# Patient Record
Sex: Male | Born: 1940 | Race: White | Hispanic: No | Smoking: Never smoker
Health system: Southern US, Community
[De-identification: ages and names within clinical notes are randomized; demographics above are authoritative.]

## PROBLEM LIST (undated history)

## (undated) DIAGNOSIS — I509 Heart failure, unspecified: Secondary | ICD-10-CM

## (undated) DIAGNOSIS — M1990 Unspecified osteoarthritis, unspecified site: Secondary | ICD-10-CM

## (undated) DIAGNOSIS — E785 Hyperlipidemia, unspecified: Secondary | ICD-10-CM

## (undated) DIAGNOSIS — E669 Obesity, unspecified: Secondary | ICD-10-CM

## (undated) DIAGNOSIS — K219 Gastro-esophageal reflux disease without esophagitis: Secondary | ICD-10-CM

## (undated) DIAGNOSIS — H3530 Unspecified macular degeneration: Secondary | ICD-10-CM

## (undated) DIAGNOSIS — I4891 Unspecified atrial fibrillation: Secondary | ICD-10-CM

## (undated) DIAGNOSIS — T8859XA Other complications of anesthesia, initial encounter: Secondary | ICD-10-CM

## (undated) DIAGNOSIS — M75102 Unspecified rotator cuff tear or rupture of left shoulder, not specified as traumatic: Secondary | ICD-10-CM

## (undated) DIAGNOSIS — I2510 Atherosclerotic heart disease of native coronary artery without angina pectoris: Secondary | ICD-10-CM

## (undated) DIAGNOSIS — I10 Essential (primary) hypertension: Secondary | ICD-10-CM

## (undated) DIAGNOSIS — E119 Type 2 diabetes mellitus without complications: Secondary | ICD-10-CM

## (undated) HISTORY — DX: Unspecified rotator cuff tear or rupture of left shoulder, not specified as traumatic: M75.102

## (undated) HISTORY — PX: KNEE SURGERY: SHX244

## (undated) HISTORY — PX: TONSILLECTOMY: SUR1361

## (undated) HISTORY — DX: Type 2 diabetes mellitus without complications: E11.9

## (undated) HISTORY — DX: Atherosclerotic heart disease of native coronary artery without angina pectoris: I25.10

## (undated) HISTORY — DX: Essential (primary) hypertension: I10

## (undated) HISTORY — DX: Hyperlipidemia, unspecified: E78.5

## (undated) HISTORY — DX: Obesity, unspecified: E66.9

## (undated) HISTORY — DX: Unspecified macular degeneration: H35.30

## (undated) HISTORY — DX: Unspecified atrial fibrillation: I48.91

## (undated) HISTORY — PX: CYST EXCISION: SHX5701

---

## 2004-08-08 ENCOUNTER — Emergency Department (HOSPITAL_COMMUNITY): Admission: EM | Admit: 2004-08-08 | Discharge: 2004-08-08 | Payer: Self-pay | Admitting: Emergency Medicine

## 2004-08-09 ENCOUNTER — Ambulatory Visit (HOSPITAL_COMMUNITY): Admission: RE | Admit: 2004-08-09 | Discharge: 2004-08-09 | Payer: Self-pay | Admitting: Family Medicine

## 2007-03-09 ENCOUNTER — Ambulatory Visit (HOSPITAL_COMMUNITY): Admission: RE | Admit: 2007-03-09 | Discharge: 2007-03-09 | Payer: Self-pay | Admitting: Interventional Cardiology

## 2009-03-03 ENCOUNTER — Inpatient Hospital Stay (HOSPITAL_COMMUNITY): Admission: RE | Admit: 2009-03-03 | Discharge: 2009-03-04 | Payer: Self-pay | Admitting: Interventional Cardiology

## 2009-03-03 ENCOUNTER — Encounter (INDEPENDENT_AMBULATORY_CARE_PROVIDER_SITE_OTHER): Payer: Self-pay | Admitting: Interventional Cardiology

## 2009-03-13 ENCOUNTER — Encounter (HOSPITAL_COMMUNITY): Admission: RE | Admit: 2009-03-13 | Discharge: 2009-06-04 | Payer: Self-pay | Admitting: Interventional Cardiology

## 2009-06-05 ENCOUNTER — Encounter (HOSPITAL_COMMUNITY): Admission: RE | Admit: 2009-06-05 | Discharge: 2009-09-03 | Payer: Self-pay | Admitting: Interventional Cardiology

## 2011-03-14 LAB — GLUCOSE, CAPILLARY: Glucose-Capillary: 107 mg/dL — ABNORMAL HIGH (ref 70–99)

## 2011-03-17 LAB — GLUCOSE, CAPILLARY
Glucose-Capillary: 110 mg/dL — ABNORMAL HIGH (ref 70–99)
Glucose-Capillary: 121 mg/dL — ABNORMAL HIGH (ref 70–99)
Glucose-Capillary: 130 mg/dL — ABNORMAL HIGH (ref 70–99)
Glucose-Capillary: 97 mg/dL (ref 70–99)

## 2011-03-18 LAB — CBC
HCT: 46.8 % (ref 39.0–52.0)
Hemoglobin: 16.3 g/dL (ref 13.0–17.0)
MCHC: 34.7 g/dL (ref 30.0–36.0)
RDW: 13.9 % (ref 11.5–15.5)

## 2011-03-18 LAB — BASIC METABOLIC PANEL
CO2: 27 mEq/L (ref 19–32)
Glucose, Bld: 111 mg/dL — ABNORMAL HIGH (ref 70–99)
Potassium: 3.4 mEq/L — ABNORMAL LOW (ref 3.5–5.1)
Sodium: 138 mEq/L (ref 135–145)

## 2011-04-20 NOTE — Cardiovascular Report (Signed)
NAME:  Bryan Pratt, Bryan Pratt NO.:  [id number]   MEDICAL RECORD NO.:  [id number]          PATIENT TYPE:  INP   LOCATION:  2503                         FACILITY:  [facility]   PHYSICIAN:  Corky Crafts, MDDATE OF BIRTH:  1941/04/08   DATE OF PROCEDURE:  03/03/2009  DATE OF DISCHARGE:  03/04/2009                            CARDIAC CATHETERIZATION   REFERRING PHYSICIAN:  Holley Bouche, M.D.   PROCEDURES PERFORMED:  Coronary angiogram, abdominal aortogram, left  ventriculogram, left heart catheterization, attempted PCI of the LAD.   OPERATOR:  Corky Crafts, MD   INDICATIONS:  Shortness of breath, coronary artery disease.   PROCEDURE NARRATIVE:  The risks and benefits of cardiac catheterization  were explained to the patient and informed consent was obtained.  He was  brought to the cath lab.  He was prepped and draped in the usual sterile  fashion.  His right groin was infiltrated with 1% lidocaine.  A 6-French  sheath was placed into the right femoral artery using the modified  Seldinger technique.  Right coronary artery angiography was performed  using a JR-4.0 catheter.  The catheter was advanced to the vessel ostium  under fluoroscopic guidance.  Digital angiography was performed in  multiple projections using hand injection of contrast.  In the left  system, there were separate ostia of the LAD and left circumflex.  A JL-  4 and JL-5 catheters were used to selectively engage the LAD and left  circumflex in a similar fashion described as above.  A pigtail catheter  was advanced to the ascending aorta and across the aortic valve under  fluoroscopic guidance.  A power injection of contrast was performed in  the RAO projection.  Catheter was pulled back under continuous  hemodynamic pressure monitoring and withdrawn to the abdominal  aortogram.  A power injection of contrast was performed in the AP  projection to image the abdominal aortogram.  The pigtail  catheter  images were performed after the attempted PCI of the LAD.  Please see  below for details of that procedure.   FINDINGS:  The right coronary artery was a large dominant vessel.  There  are mild irregularities in the right coronary artery.  There is a 50%  proximal posterolateral artery stenosis.   The left anterior descending had moderate proximal diffuse disease.  The  mid LAD was occluded and filled by bridging collaterals.  There are also  some right to left collaterals filling the distal LAD.   The left circumflex is a medium-sized vessel.  There were several  branches across the lateral wall.  There is a 50% stenosis in a trunk  proximal to these 3 branches, the OM-1, OM-2, and OM-3, all of which  were widely patent.   PCI NARRATIVE:  An XBLAD 3.5 catheter was used to engage the LAD as  there was no left main.  A Prowater wire was placed with an over-the-  wire balloon to the mid LAD, but was unable to cross because of bridging  collaterals.  The wire was changed out to a Miracle 3 g wire with the  same over-the-wire balloon.  There was still trouble crossing the  occlusion because of the bridging collaterals.  There is no dye staining  after multiple injections and the procedure was stopped at that point.  Heparin was used for anticoagulation.   Left ventriculogram showed anterior hypokinesis with an estimated  ejection fraction of 40-45%.   IMPRESSION:  1. 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  2. Mild-to-moderate decreased left ventricular function.  3. Mild aortic valve gradient with an left ventricular pressure of      113/7 with an left ventricular end-diastolic pressure of 18 mmHg.      Aortic pressure of 97/59 with a mean aortic pressure of 77 mmHg.  4. No abdominal aortic aneurysm or renal artery stenosis.   RECOMMENDATIONS:  1. I  had a long discussion with the patient regarding the various      options.  We talked about having him see a surgeon for bypass      surgery.  At this time, he feels like his symptoms are not that      bad.  He would like to hold off on that and continue medical      therapy at this time.  If his symptoms worsen, he will let us know      and we will plan on having him visit with a surgeon.  2. We will watch the patient overnight, and assuming no complications,      plan for discharge in the morning.      Corky Crafts, MD  Electronically Signed     JSV/MEDQ  D:  05/22/2009  T:  05/23/2009  Job:  [id number]

## 2011-04-20 NOTE — Discharge Summary (Signed)
NAME:  Bryan Pratt, RUSHER NO.:  [id number]   MEDICAL RECORD NO.:  [id number]          PATIENT TYPE:  INP   LOCATION:  2503                         FACILITY:  [facility]   PHYSICIAN:  Corky Crafts, MDDATE OF BIRTH:  1941-11-22   DATE OF ADMISSION:  03/03/2009  DATE OF DISCHARGE:  03/04/2009                               DISCHARGE SUMMARY   DISCHARGE DIAGNOSES:  1. Coronary artery disease, status post chronically occluded left      anterior descending artery with collateral, 50% circumflex, 56%      proximal posterolateral artery.  2. Ischemic cardiomyopathy with EF 40-45%.  3. Hypokalemia, repleted.  4. Dyslipidemia, treated.  5. Intolerance to IMDUR, red rash.  6. Allergy to MORPHINE, SULFATE, and CODEINE.  7. Diabetes mellitus.  8. Obesity.  9. Long-term medication use.  10.Family history of coronary artery disease.   HOSPITAL COURSE:  Mr. Griesinger is a 70 year old male patient who has a  known LAD occlusion.  He was recently working at the yard and had some  soreness in his chest and back.  He feels shortness of breath with  exertion, which is getting worse.  He denies any chest tightness with  minimal walking.  We think that his shortness of breath is anginal  equivalent and we decided that we would like to proceed with cardiac  catheterization with possible percutaneous coronary intervention.  We  are worried that the other 2 vessels may have worsened in the degree of  stenosis.   He was admitted on March 03, 2009, with problem as following LAD was  occluded in midvessel and fills like bridging collaterals, right to  left.  The circumflex had a 50-60% stenosis in the trunk proximal to the  obtuse marginal branches.  The right coronary artery was dominant with a  50% proximal PLA stenosis.   Dr. Eldridge Dace attempted to cross the LAD lesion, but could not cross  secondary to bridging collaterals.  At this point, we felt that the  patient would need medical  management or CABG.  He was not interested in  talking to a Careers adviser.  We watched him overnight and lab studies showed  potassium of 3.4 and this was repleted and the lab work looked good.  At  this point, the patient is ambulated with cardiac rehabilitation and  then so without any extensive.  If his shortness of breath persists;  however, we need to consider coronary artery bypass grafting.  The  patient is aware of this.   DISCHARGE MEDICATIONS:  1. Maxzide one a day.  2. Vytorin 10/40 mg 1 a day.  3. Nadolol 20 mg 2 a day.  4. Enteric-coated aspirin 325 mg a day.  5. Multivitamin daily.  6. Fish oil daily.  7. Stool softener daily as needed.  8. Plavix 75 mg a day.  9. Resume metformin on March 07, 2009.  10.Sublingual nitroglycerin p.r.n. chest pain.  11.Baby aspirin 81 mg a day.   The patient is to remain on low-sodium heart-healthy diabetic diet.  Clean cath site gently with soap and water.  No scrubbing.  Increase  activity slowly.  No lifting over 10 pounds for one week.  No driving  for 2 days.  Go to the lab 30 minutes before his visit on March 12, 2009,  at 2:00 p.m. to see Dr. Eldridge Dace, this is to recheck his potassium which  was low during this hospitalization.      Guy Franco, P.A.      Corky Crafts, MD  Electronically Signed    LB/MEDQ  D:  03/04/2009  T:  03/04/2009  Job:  [id number]   cc:   Melida Quitter, M.D.

## 2011-04-23 NOTE — Cardiovascular Report (Signed)
NAME:  JANARI, YAMADA NO.:  [id number]   MEDICAL RECORD NO.:  [id number]          PATIENT TYPE:  OIB   LOCATION:  2899                         FACILITY:  [facility]   PHYSICIAN:  Corky Crafts, MDDATE OF BIRTH:  1941/09/11   DATE OF PROCEDURE:  03/09/2007  DATE OF DISCHARGE:                            CARDIAC CATHETERIZATION   PROCEDURES PERFORMED:  Left heart catheterization, left ventriculogram,  coronary angiogram, abdominal aortogram.   INDICATIONS:  Abnormal stress test and angina.   PROCEDURE AND NARRATIVE:  The risks and benefits of cardiac  catheterization were explained to the patient and informed consent was  obtained.  The patient was brought to the cath lab and placed on the  table.  He was prepped and draped in the usual sterile fashion.  His  right groin was infiltrated with 1% lidocaine.  A 6-French arterial  sheath was placed into his right femoral artery using modified Seldinger  technique.  Left coronary artery angiography was attempted with a JL 4.0  catheter.  It was noted that there was a separate ostia of the LAD and  the circumflex.  The JL 4.0  catheter was used to take pictures  selectively of the LAD.  A JL 5.0 catheter was used to take pictures of  the circumflex.  Right coronary artery angiography was then performed  using a JR 4.0 catheter.  The catheter was advanced to the ostium under  fluoroscopic guidance.  Digital angiography was performed in multiple  projections using hand injection of contrast.  A pigtail catheter was  advanced to the ascending aorta and across the aortic valve.  Power  injection of contrast was done in the third degree RAO position to image  the left ventricle.  Pullback was performed under continuous hemodynamic  pressure monitoring.  The catheter was then withdrawn to the level of  the renal arteries.  A power injection of contrast was done in the AP  projection.  The sheath was removed using manual  compression.   FINDINGS:  The left anterior descending is a moderately diseased vessel  diffusely.  In the mid portion of the vessel there is a 100% occlusion  which appears to be chronic.  There are bridging collaterals which  filled the distal portion of the vessel.  There is also competitive flow  noted in the distal vessel likely from right-to-left collaterals.  There  is a medium-sized diagonal past this occlusion that appears to be filled  by the bridging collaterals.  The circumflex is a medium-sized vessel  with mild to moderate atherosclerosis diffusely.  There are no  hemodynamically significant lesions.  The OM-1 OM-2 and OM-3 are medium-  sized vessels with moderate atherosclerosis.  The right coronary artery  is a large dominant vessel.  There are luminal irregularities  throughout.  There is a 25% distal RCA stenosis right at the origin of  the posterolateral branch.  The PDA supplies collaterals to the LAD  territory.  Left ventriculogram, there is anteroapical hypokinesis with  ejection fraction estimated be 40 to 45%.  The abdominal aortogram  reveals mild aortic  atherosclerosis.  There are single renal arteries  bilaterally with no significant renal artery stenosis.   HEMODYNAMICS:  Left ventricular pressure 96/70 with an LVEDP of 13.  Aortic pressure of 100/69 with a mean aortic pressure of 83.   IMPRESSION:  1. 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  2. Moderate atherosclerosis in the other territories without      hemodynamically significant stenoses.  3. The distal LAD is filled by right-to-left collaterals and also      bridging collaterals within the LAD.  4. Mild left ventricular dysfunction.  5. No renal artery stenosis.   RECOMMENDATIONS:  Will add additional medical therapy to the patient's  regimen.  Continue his beta blocker.  We will add Imdur and Plavix to  try and manage his  symptoms of angina medically.  If he is unable to  have these symptoms controlled we will consider bringing him back for  attempted revascularization of this chronically occluded left anterior  descending.      Corky Crafts, MD  Electronically Signed     JSV/MEDQ  D:  03/09/2007  T:  03/09/2007  Job:  [id number]

## 2012-01-11 DIAGNOSIS — I4891 Unspecified atrial fibrillation: Secondary | ICD-10-CM | POA: Diagnosis not present

## 2012-01-11 DIAGNOSIS — E669 Obesity, unspecified: Secondary | ICD-10-CM | POA: Diagnosis not present

## 2012-01-11 DIAGNOSIS — I2510 Atherosclerotic heart disease of native coronary artery without angina pectoris: Secondary | ICD-10-CM | POA: Diagnosis not present

## 2012-01-11 DIAGNOSIS — R079 Chest pain, unspecified: Secondary | ICD-10-CM | POA: Diagnosis not present

## 2012-01-16 DIAGNOSIS — B029 Zoster without complications: Secondary | ICD-10-CM | POA: Diagnosis not present

## 2012-01-17 DIAGNOSIS — H353190 Nonexudative age-related macular degeneration, unspecified eye, stage unspecified: Secondary | ICD-10-CM | POA: Diagnosis not present

## 2012-01-27 DIAGNOSIS — E119 Type 2 diabetes mellitus without complications: Secondary | ICD-10-CM | POA: Diagnosis not present

## 2012-01-27 DIAGNOSIS — E785 Hyperlipidemia, unspecified: Secondary | ICD-10-CM | POA: Diagnosis not present

## 2012-01-27 DIAGNOSIS — I10 Essential (primary) hypertension: Secondary | ICD-10-CM | POA: Diagnosis not present

## 2012-01-27 DIAGNOSIS — M2550 Pain in unspecified joint: Secondary | ICD-10-CM | POA: Diagnosis not present

## 2012-02-07 DIAGNOSIS — H353190 Nonexudative age-related macular degeneration, unspecified eye, stage unspecified: Secondary | ICD-10-CM | POA: Diagnosis not present

## 2012-04-25 DIAGNOSIS — D1801 Hemangioma of skin and subcutaneous tissue: Secondary | ICD-10-CM | POA: Diagnosis not present

## 2012-04-25 DIAGNOSIS — D235 Other benign neoplasm of skin of trunk: Secondary | ICD-10-CM | POA: Diagnosis not present

## 2012-04-25 DIAGNOSIS — L570 Actinic keratosis: Secondary | ICD-10-CM | POA: Diagnosis not present

## 2012-04-25 DIAGNOSIS — Z85828 Personal history of other malignant neoplasm of skin: Secondary | ICD-10-CM | POA: Diagnosis not present

## 2012-07-11 DIAGNOSIS — I4891 Unspecified atrial fibrillation: Secondary | ICD-10-CM | POA: Diagnosis not present

## 2012-07-11 DIAGNOSIS — E669 Obesity, unspecified: Secondary | ICD-10-CM | POA: Diagnosis not present

## 2012-07-11 DIAGNOSIS — R079 Chest pain, unspecified: Secondary | ICD-10-CM | POA: Diagnosis not present

## 2012-07-11 DIAGNOSIS — I2510 Atherosclerotic heart disease of native coronary artery without angina pectoris: Secondary | ICD-10-CM | POA: Diagnosis not present

## 2012-08-23 DIAGNOSIS — Z23 Encounter for immunization: Secondary | ICD-10-CM | POA: Diagnosis not present

## 2012-08-23 DIAGNOSIS — E785 Hyperlipidemia, unspecified: Secondary | ICD-10-CM | POA: Diagnosis not present

## 2012-08-23 DIAGNOSIS — I10 Essential (primary) hypertension: Secondary | ICD-10-CM | POA: Diagnosis not present

## 2012-08-23 DIAGNOSIS — E119 Type 2 diabetes mellitus without complications: Secondary | ICD-10-CM | POA: Diagnosis not present

## 2012-12-06 HISTORY — PX: CORONARY ARTERY BYPASS GRAFT: SHX141

## 2012-12-06 HISTORY — PX: MAZE: SHX5063

## 2013-01-09 DIAGNOSIS — R079 Chest pain, unspecified: Secondary | ICD-10-CM | POA: Diagnosis not present

## 2013-01-09 DIAGNOSIS — I2510 Atherosclerotic heart disease of native coronary artery without angina pectoris: Secondary | ICD-10-CM | POA: Diagnosis not present

## 2013-01-09 DIAGNOSIS — I4891 Unspecified atrial fibrillation: Secondary | ICD-10-CM | POA: Diagnosis not present

## 2013-01-09 DIAGNOSIS — E669 Obesity, unspecified: Secondary | ICD-10-CM | POA: Diagnosis not present

## 2013-02-15 DIAGNOSIS — H2510 Age-related nuclear cataract, unspecified eye: Secondary | ICD-10-CM | POA: Diagnosis not present

## 2013-02-15 DIAGNOSIS — H353190 Nonexudative age-related macular degeneration, unspecified eye, stage unspecified: Secondary | ICD-10-CM | POA: Diagnosis not present

## 2013-03-02 DIAGNOSIS — I4891 Unspecified atrial fibrillation: Secondary | ICD-10-CM | POA: Diagnosis not present

## 2013-03-02 DIAGNOSIS — I2510 Atherosclerotic heart disease of native coronary artery without angina pectoris: Secondary | ICD-10-CM | POA: Diagnosis not present

## 2013-03-02 DIAGNOSIS — R079 Chest pain, unspecified: Secondary | ICD-10-CM | POA: Diagnosis not present

## 2013-03-02 DIAGNOSIS — I10 Essential (primary) hypertension: Secondary | ICD-10-CM | POA: Diagnosis not present

## 2013-03-05 ENCOUNTER — Other Ambulatory Visit: Payer: Self-pay | Admitting: Interventional Cardiology

## 2013-03-06 ENCOUNTER — Other Ambulatory Visit: Payer: Self-pay | Admitting: Cardiology

## 2013-03-06 NOTE — H&P (Signed)
JV/PRE CATH WORK UP/414.01/LABS/SEE [name].      HPI:     General:            Bryan Pratt is a 71 y/o male followed by Bryan Pratt with a hx of CAD, diabetes and hypertension. It was noted by previous cardiac cath 2010 that he had CTO of LAD multivessels and needs revascularization with CABG. Bryan Pratt wanted to proceed with cardiac catheterization. At office visit 01/09/13 he was noted to be in [place] Atrial fibrillation with Bryan Pratt started.          He has a stressful life due to his wife's severe Alzheimers. He has occasional mucle pain in knees and shoulders but after working outside he noticed chest discomfort and more SOB than in the past winters. He denies dizziness, syncope, nor PND. He does have occasional gait problems, no falls, He has occasional head aches, and neck aches. He is off Plavix and on the Bryan Pratt, he had mild nose bleeds on both but more frequent on the Bryan Pratt. no signs of bleeding in stools, bruising nor gums. He feels an occasional skip beat with the Atrial fibrillation that is unchanged.        .      ROS:      as noted in HPI, no hx of allergy to IVP dye, no neurological changes, no fever, chills, nausea, nor vomiting.     Medical History: CAD-s/p cath, occluded LAD with collateral fillling, 2008, last cath 03/03/2009 with CTO of the LAD multivessell., Obesity, HTN -[facility], Hyperlipidemia, Diabetes, type 2, Hemorrhoids, Allergie: dust, ragweed, wheat, peanuts, apples, 01/09/13--new onset Atrial fibrillation.      Surgical History: Appy 1964, Knee surgery, shattered right patella 1965, cyst removal, cheek 1970, tonsillectomy 1949.      Family History:  Father: deceased, CAD, HTN, heart attack, hyperlipidemiaMother: deceased, cancerPaternal Grand Mother: HTN, diabetes, hyperlipidemiaMaternal Grand Father: cancerBrother 1: HTN, diabetes, hyperlipidemiaSister 1: diabetes, hyperlipidemia Younger brother had CABG in 2009.     Social History:      General: History of smoking   cigarettes: Former smoker.  no Smoking. Occupation: retired. Marital Status: married, wife has Alzheimers. Children: 3. Religion: Presbyterian. Seat belt use: yes.      Medications: Taking Multivitamins Tablet 1 tablet once a day, Taking Stool Softener 100 MG Capsule 1 capsule as needed once a day, Taking Vitamin B 12 1 tablet once a day, Taking Vitamin C 100 MG Tablet 1 tablet Once a day, Taking Lorazepam 0.5 MG Tablet 1 tablet as needed, Taking Nexium 40 MG capsule 1 capsule as needed, Taking Tramadol HCl 50 MG Tablet 1 tablet as needed for pain every 4 hours if needed a pain, Taking Ocuvite Tablet as directed , Taking Vytorin 10-40 MG Tablet 1 tablet Once a day, Taking Nadolol 20 MG Tablet 1 tablet once a day, Taking Maxzide-25 37.5-25 MG Tablet 1 tablet in the morning Once a day, Taking Nitroglycerin 0.4 mg 0.4 mg tablet 1 tablet as directed as directed prn chest pain, Taking Bryan Pratt 5 mg . Tablet 1 tablet twice a day, Taking Metformin HCl 500 MG Tablet 1 tablet once a day, Not-Taking/PRN Aspirin 81mg  Tablet Delayed Release 1 tablet Once a day, Medication List reviewed and reconciled with the patient     Allergies: Codeine Sulfate, Morphine Sulfate: severe reaction with coma..        Vitals: Wt 235.0, Wt change 1.2 lb, Ht 69, BMI 34.70, Pulse sitting 68, BP sitting 120/78.     Examination:  General Examination:         GENERAL APPEARANCE alert, oriented, NAD, pleasant. SKIN: normal, no rash. HEAD: [place]/AT. EYES: EOMI, Conjunctiva clear, no drainage. NECK: supple, no bruits, no JVD. LUNGS: clear to auscultation bilaterally, no wheezes, rhonchi, rales. HEART: irregularly irregular rhythm, regular rate and rhythm, normal S1S2. ABDOMEN: soft and not tender, non-distended,. EXTREMITIES: no edema. NEUROLOGIC EXAM: non-focal exam. PSYCH appropriate mood and affect .               Assessment:  1. CAD in native artery - 414.01 (Primary)   2. Essential hypertension, benign - 401.1   3. Atrial  fibrillation - 427.31   4. Chest pain - 786.50       1. CAD in native artery  Continue Nitroglycerin 0.4 mg tablet, 0.4 mg, 1 tablet as directed, SL, as directed prn chest pain ;  Continue Vytorin Tablet, 10-40 MG, 1 tablet, by mouth, Once a day .         LAB: Basic Metabolic      LAB: CBC with Diff      LAB: PT and PTT ([id number]) Notes: Risks and benefits of cardiac catheterization have been reviewed including risk of stroke, heart attack, death, bleeding, renal impariment and arterial damage. There was ample oppurtuny to answer questions. Alternatives were discussed. Patient understands and wishes to proceed.  plan for out pt cath at [facility] lab by radial approach.       2. Essential hypertension, benign  Continue Maxzide-25 Tablet, 37.5-25 MG, 1 tablet in the morning, Orally, Once a day .       3. Atrial fibrillation  Continue Bryan Pratt 5 mg Tablet, ., 1 tablet, orally, twice a day .    Notes: pt informed to avoid aspirin products and to not take Bryan Pratt for 2 days prior to procedure and Bryan Pratt will inform him whento restart after procedure.       4. Others   Notes: also to not take Metformin the day of the procedure and for 48 hrs after the procedure.          Procedures:      Venipuncture:          Venipuncture: Gasanova,Svetlana 03/02/2013 02:17:55 PM > , performed in right arm.             Labs:        Lab: Basic Metabolic       GLUCOSE 125 H 70-99 - mg/dL       BUN 14  0-45 - mg/dL       CREATININE 4.09  0.60-1.30 - mg/dl       eGFR (NON-AFRICAN AMERICAN) 93  >60 - calc       eGFR (AFRICAN AMERICAN) 113  >60 - calc       SODIUM 140  136-145 - mmol/L       POTASSIUM 4.3  3.5-5.5 - mmol/L       CHLORIDE 103  98-107 - mmol/L       C02 35 H 22-32 - mmol/L       ANION GAP 6.7  6.0-20.0 - mmol/L       CALCIUM 10.1  8.6-10.3 - mg/dL                 [name] 03/02/2013 04:25:07 PM > OK FOR PROCEDURE [name],[name] 03/02/2013 04:38:14 PM > noted               Lab: CBC  with Diff  WBC 7.3  4.0-11.0 - K/ul       RBC 5.22  4.20-5.80 - M/uL       HGB 16.3  13.0-17.0 - g/dL       HCT 16.1  09.6-04.5 - %       MCH 31.2  27.0-33.0 - pg       MPV 9.2  7.5-10.7 - fL       MCV 92.8  80.0-94.0 - fL       MCHC 33.6  32.0-36.0 - g/dL       RDW 40.9  81.1-91.4 - %       NRBC# 0.00  -        PLT 192  150-400 - K/uL       NEUT % 68.8  43.3-71.9 - %       NRBC% 0.00  - %       LYMPH% 18.8  16.8-43.5 - %       MONO % 7.7  4.6-12.4 - %       EOS % 3.8  0.0-7.8 - %       BASO % 0.9  0.0-1.0 - %       NEUT # 5.0  1.9-7.2 - K/uL       LYMPH# 1.40  1.10-2.70 - K/uL       MONO # 0.6  0.3-0.8 - K/uL       EOS # 0.3  0.0-0.6 - K/uL       BASO # 0.1  0.0-0.1 - K/uL                 Kimerly Rowand A 03/02/2013 04:24:28 PM > OK FOR CATH [name],[name] 03/02/2013 04:38:02 PM > noted               Lab: PT and PTT ([id number])       aPTT 32  24-33 - SEC       INR 1.1  0.8-1.2 -        Prothrombin Time 11.9  9.1-12.0 - SEC                 Joda Braatz A 03/04/2013 08:08:41 PM > ok           Procedure Codes: 21308 ECL BMP, 85025 ECL CBC PLATELET DIFF, 65784 BLOOD COLLECTION ROUTINE VENIPUNCTURE     Follow Up: [facility] pending cath (Reason: CAD, Atrial fibrillation)        Care Plan Details:  High blood pressure  High blood pressure, asthma, diabetes ADHD  Diabetes  Handouts  Smoking cessation  Chronic kidney disease  ADHD  Asthma       Provider: Michaell Cowing. Emelda Fear, NP  Patient: Bryan Pratt, Bryan Pratt  DOB: 01/23/1941  Date: 03/02/2013

## 2013-03-07 ENCOUNTER — Encounter (HOSPITAL_BASED_OUTPATIENT_CLINIC_OR_DEPARTMENT_OTHER): Payer: Self-pay | Admitting: *Deleted

## 2013-03-07 ENCOUNTER — Encounter (HOSPITAL_BASED_OUTPATIENT_CLINIC_OR_DEPARTMENT_OTHER)
Admission: RE | Disposition: A | Discharge status: Home / Self Care | Payer: Self-pay | Source: Ambulatory Visit | Attending: Interventional Cardiology

## 2013-03-07 ENCOUNTER — Inpatient Hospital Stay (HOSPITAL_BASED_OUTPATIENT_CLINIC_OR_DEPARTMENT_OTHER)
Admission: RE | Admit: 2013-03-07 | Discharge: 2013-03-07 | Disposition: A | Discharge status: Home / Self Care | Payer: Medicare Other | Source: Ambulatory Visit | Attending: Interventional Cardiology | Admitting: Interventional Cardiology

## 2013-03-07 DIAGNOSIS — E119 Type 2 diabetes mellitus without complications: Secondary | ICD-10-CM | POA: Insufficient documentation

## 2013-03-07 DIAGNOSIS — I2510 Atherosclerotic heart disease of native coronary artery without angina pectoris: Secondary | ICD-10-CM | POA: Diagnosis not present

## 2013-03-07 DIAGNOSIS — I10 Essential (primary) hypertension: Secondary | ICD-10-CM | POA: Insufficient documentation

## 2013-03-07 HISTORY — DX: Atherosclerotic heart disease of native coronary artery without angina pectoris: I25.10

## 2013-03-07 SURGERY — JV LEFT HEART CATHETERIZATION WITH CORONARY ANGIOGRAM
Anesthesia: Moderate Sedation

## 2013-03-07 MED ORDER — SODIUM CHLORIDE 0.9 % IJ SOLN: 3.0000 mL | Freq: Two times a day (BID) | INTRAMUSCULAR | Status: DC

## 2013-03-07 MED ORDER — SODIUM CHLORIDE 0.9 % IV SOLN: 250.0000 mL | INTRAVENOUS | Status: DC | PRN

## 2013-03-07 MED ORDER — SODIUM CHLORIDE 0.9 % IV SOLN
INTRAVENOUS | Status: DC
  Administered 2013-03-07: 08:00:00 via INTRAVENOUS

## 2013-03-07 MED ORDER — ASPIRIN 81 MG PO CHEW
324.0000 mg | CHEWABLE_TABLET | ORAL | Status: AC
  Administered 2013-03-07: 324 mg via ORAL

## 2013-03-07 MED ORDER — SODIUM CHLORIDE 0.9 % IJ SOLN: 3.0000 mL | INTRAMUSCULAR | Status: DC | PRN

## 2013-03-07 MED ORDER — ONDANSETRON HCL 4 MG/2ML IJ SOLN: 4.0000 mg | Freq: Four times a day (QID) | INTRAMUSCULAR | Status: DC | PRN

## 2013-03-07 MED ORDER — DIAZEPAM 5 MG PO TABS
5.0000 mg | ORAL_TABLET | ORAL | Status: AC
  Administered 2013-03-07: 5 mg via ORAL

## 2013-03-07 MED ORDER — ASPIRIN 81 MG PO CHEW: 81.0000 mg | CHEWABLE_TABLET | Freq: Every day | ORAL | Status: DC

## 2013-03-07 MED ORDER — ACETAMINOPHEN 325 MG PO TABS: 650.0000 mg | ORAL_TABLET | ORAL | Status: DC | PRN

## 2013-03-07 NOTE — CV Procedure (Signed)
PROCEDURE:  Left heart catheterization with selective coronary angiography, left ventriculogram. Abdominal aortogram  INDICATIONS:  CAD  The risks, benefits, and details of the procedure were explained to the patient.  The patient verbalized understanding and wanted to proceed.  Informed written consent was obtained.  PROCEDURE TECHNIQUE:  After Xylocaine anesthesia a 87F sheath was placed in the right radial artery with a single anterior needle wall stick.   Left coronary angiography was done using a Judkins L3.5 guide catheter.  Right coronary angiography was done using a Judkins R4 guide catheter.  Left ventriculography was done using a pigtail catheter.    CONTRAST:  Total of 100 cc.  COMPLICATIONS:  None.    HEMODYNAMICS:  Aortic pressure was 99/59; LV pressure was 102/18; LVEDP 23.  There was no gradient between the left ventricle and aorta.    ANGIOGRAPHIC DATA:   The left main coronary artery is absent.  There are separate ostia of the LAD and circumflex.  The left anterior descending artery is a medium-size vessel.  At the ostium, there is a 75% stenosis followed by moderate disease which caused catheter damping.  The mid LAD is occluded and there are bridging collaterals which feed the mid to distal LAD and a medium-sized diagonal branch.  The LAD appears small but I suspect this is because it is underfilled.  The medium-sized diagonal branch appears widely patent.  The left circumflex artery is a large vessel proximally.  There is a trifurcation with 3 large obtuse marginal vessels originating.  The OM1 has a 75% proximal stenosis.  The OM 2 has only mild atherosclerosis.  The OM 3 he also has mild atherosclerosis.  The continuation of the circumflex in the AV groove is small but patent.  The right coronary artery is a large dominant vessel with mild atherosclerosis.  The posterior lateral artery is large with mild proximal disease.  The posterior descending artery is medium-sized and  widely patent.  There are faint right to left collaterals filling the LAD.  LEFT VENTRICULOGRAM:  Left ventricular angiogram was done in the 30 RAO projection and revealed hypokinesis of the mid to distal anterior wall and apex with an estimated ejection fraction of 35 %.  LVEDP was 21 mmHg.   ABDOMINAL AORTOGRAM: Mild aortic atherosclerosis.  No obvious aneurysm.  Bilateral single renal arteries which appear widely patent.  Bilateral common iliac arteries appear patent.  Tortuous iliac systems bilaterally.  SMA appears widely patent.  IMPRESSIONS:  1. Absent left main coronary artery.  There is separate ostia of the LAD and circumflex. 2. Significant ostial disease of the left anterior descending artery with catheter damping.  Occluded mid LAD with bridging collaterals feeding the distal vessel.  There are also faint right to left collaterals. 3. Mild disease in the proximal left circumflex artery.  There is a 75% stenosis in the proximal first obtuse marginal. 4. Mild disease in the large, dominant right coronary artery. 5. Moderately decreased left ventricular systolic function.  LVEDP 21 mmHg.  Ejection fraction 35 %. 6.  No abdominal aortic aneurysm.  No renal artery stenosis.  RECOMMENDATION:  Will refer the patient for the patient for bypass surgery.  I think he would benefit from a LIMA to LAD.  This would likely improve his left ventricular function.  I do not think he requires a graft to his right coronary artery.  There is no significant disease in this vessel.  The first obtuse marginal has disease, but I'm not sure if  it is large enough vessel to be grafted.

## 2013-03-07 NOTE — OR Nursing (Signed)
Meal served 

## 2013-03-07 NOTE — OR Nursing (Signed)
Tr band removed, site level 0, pressure dressing and velcro wrist splint applied, distal circulation intact, discharge instructions reviewed and signed, ambulated in hall without difficulty, transported to daughter's car via wheelchair

## 2013-03-09 LAB — POCT I-STAT GLUCOSE
Glucose, Bld: 97 mg/dL (ref 70–99)
Operator id: 221371

## 2013-03-11 NOTE — H&P (Signed)
Date of Initial H&P: 03/06/13  History reviewed, patient examined, no change in status, stable for surgery.

## 2013-03-14 DIAGNOSIS — I2510 Atherosclerotic heart disease of native coronary artery without angina pectoris: Secondary | ICD-10-CM | POA: Diagnosis not present

## 2013-03-14 DIAGNOSIS — I4891 Unspecified atrial fibrillation: Secondary | ICD-10-CM | POA: Diagnosis not present

## 2013-03-28 DIAGNOSIS — Z23 Encounter for immunization: Secondary | ICD-10-CM | POA: Diagnosis not present

## 2013-03-28 DIAGNOSIS — I10 Essential (primary) hypertension: Secondary | ICD-10-CM | POA: Diagnosis not present

## 2013-03-28 DIAGNOSIS — I4891 Unspecified atrial fibrillation: Secondary | ICD-10-CM | POA: Diagnosis not present

## 2013-03-28 DIAGNOSIS — E119 Type 2 diabetes mellitus without complications: Secondary | ICD-10-CM | POA: Diagnosis not present

## 2013-03-28 DIAGNOSIS — I2510 Atherosclerotic heart disease of native coronary artery without angina pectoris: Secondary | ICD-10-CM | POA: Diagnosis not present

## 2013-03-28 DIAGNOSIS — E785 Hyperlipidemia, unspecified: Secondary | ICD-10-CM | POA: Diagnosis not present

## 2013-03-28 DIAGNOSIS — Z125 Encounter for screening for malignant neoplasm of prostate: Secondary | ICD-10-CM | POA: Diagnosis not present

## 2013-03-30 DIAGNOSIS — I781 Nevus, non-neoplastic: Secondary | ICD-10-CM | POA: Diagnosis not present

## 2013-03-30 DIAGNOSIS — L570 Actinic keratosis: Secondary | ICD-10-CM | POA: Diagnosis not present

## 2013-03-30 DIAGNOSIS — C44319 Basal cell carcinoma of skin of other parts of face: Secondary | ICD-10-CM | POA: Diagnosis not present

## 2013-04-18 DIAGNOSIS — Z01818 Encounter for other preprocedural examination: Secondary | ICD-10-CM | POA: Diagnosis not present

## 2013-04-18 DIAGNOSIS — I2510 Atherosclerotic heart disease of native coronary artery without angina pectoris: Secondary | ICD-10-CM | POA: Diagnosis not present

## 2013-04-18 DIAGNOSIS — I4891 Unspecified atrial fibrillation: Secondary | ICD-10-CM | POA: Diagnosis not present

## 2013-04-18 DIAGNOSIS — Z01810 Encounter for preprocedural cardiovascular examination: Secondary | ICD-10-CM | POA: Diagnosis not present

## 2013-04-18 DIAGNOSIS — E119 Type 2 diabetes mellitus without complications: Secondary | ICD-10-CM | POA: Diagnosis not present

## 2013-04-27 DIAGNOSIS — E669 Obesity, unspecified: Secondary | ICD-10-CM | POA: Diagnosis present

## 2013-04-27 DIAGNOSIS — J9819 Other pulmonary collapse: Secondary | ICD-10-CM | POA: Diagnosis not present

## 2013-04-27 DIAGNOSIS — I10 Essential (primary) hypertension: Secondary | ICD-10-CM | POA: Diagnosis not present

## 2013-04-27 DIAGNOSIS — R262 Difficulty in walking, not elsewhere classified: Secondary | ICD-10-CM | POA: Diagnosis not present

## 2013-04-27 DIAGNOSIS — D72829 Elevated white blood cell count, unspecified: Secondary | ICD-10-CM | POA: Diagnosis present

## 2013-04-27 DIAGNOSIS — D696 Thrombocytopenia, unspecified: Secondary | ICD-10-CM | POA: Diagnosis present

## 2013-04-27 DIAGNOSIS — I2510 Atherosclerotic heart disease of native coronary artery without angina pectoris: Secondary | ICD-10-CM | POA: Diagnosis not present

## 2013-04-27 DIAGNOSIS — J95821 Acute postprocedural respiratory failure: Secondary | ICD-10-CM | POA: Diagnosis not present

## 2013-04-27 DIAGNOSIS — K649 Unspecified hemorrhoids: Secondary | ICD-10-CM | POA: Diagnosis present

## 2013-04-27 DIAGNOSIS — I4891 Unspecified atrial fibrillation: Secondary | ICD-10-CM | POA: Diagnosis present

## 2013-04-27 DIAGNOSIS — R570 Cardiogenic shock: Secondary | ICD-10-CM | POA: Diagnosis not present

## 2013-04-27 DIAGNOSIS — E8779 Other fluid overload: Secondary | ICD-10-CM | POA: Diagnosis not present

## 2013-04-27 DIAGNOSIS — Z9089 Acquired absence of other organs: Secondary | ICD-10-CM | POA: Diagnosis not present

## 2013-04-27 DIAGNOSIS — Z5189 Encounter for other specified aftercare: Secondary | ICD-10-CM | POA: Diagnosis not present

## 2013-04-27 DIAGNOSIS — E876 Hypokalemia: Secondary | ICD-10-CM | POA: Diagnosis not present

## 2013-04-27 DIAGNOSIS — Z4889 Encounter for other specified surgical aftercare: Secondary | ICD-10-CM | POA: Diagnosis not present

## 2013-04-27 DIAGNOSIS — E785 Hyperlipidemia, unspecified: Secondary | ICD-10-CM | POA: Diagnosis not present

## 2013-04-27 DIAGNOSIS — I4892 Unspecified atrial flutter: Secondary | ICD-10-CM | POA: Diagnosis not present

## 2013-04-27 DIAGNOSIS — J90 Pleural effusion, not elsewhere classified: Secondary | ICD-10-CM | POA: Diagnosis not present

## 2013-04-27 DIAGNOSIS — I440 Atrioventricular block, first degree: Secondary | ICD-10-CM | POA: Diagnosis not present

## 2013-04-27 DIAGNOSIS — I959 Hypotension, unspecified: Secondary | ICD-10-CM | POA: Diagnosis not present

## 2013-04-27 DIAGNOSIS — D62 Acute posthemorrhagic anemia: Secondary | ICD-10-CM | POA: Diagnosis not present

## 2013-04-27 DIAGNOSIS — E119 Type 2 diabetes mellitus without complications: Secondary | ICD-10-CM | POA: Diagnosis not present

## 2013-04-27 DIAGNOSIS — Z6833 Body mass index (BMI) 33.0-33.9, adult: Secondary | ICD-10-CM | POA: Diagnosis not present

## 2013-04-27 DIAGNOSIS — I2119 ST elevation (STEMI) myocardial infarction involving other coronary artery of inferior wall: Secondary | ICD-10-CM | POA: Diagnosis not present

## 2013-04-27 DIAGNOSIS — I259 Chronic ischemic heart disease, unspecified: Secondary | ICD-10-CM | POA: Diagnosis not present

## 2013-04-27 DIAGNOSIS — Z951 Presence of aortocoronary bypass graft: Secondary | ICD-10-CM | POA: Diagnosis not present

## 2013-05-04 DIAGNOSIS — I259 Chronic ischemic heart disease, unspecified: Secondary | ICD-10-CM | POA: Diagnosis not present

## 2013-05-04 DIAGNOSIS — R262 Difficulty in walking, not elsewhere classified: Secondary | ICD-10-CM | POA: Diagnosis not present

## 2013-05-04 DIAGNOSIS — I2510 Atherosclerotic heart disease of native coronary artery without angina pectoris: Secondary | ICD-10-CM | POA: Diagnosis not present

## 2013-05-04 DIAGNOSIS — Z951 Presence of aortocoronary bypass graft: Secondary | ICD-10-CM | POA: Diagnosis not present

## 2013-05-04 DIAGNOSIS — E119 Type 2 diabetes mellitus without complications: Secondary | ICD-10-CM | POA: Diagnosis not present

## 2013-05-04 DIAGNOSIS — I10 Essential (primary) hypertension: Secondary | ICD-10-CM | POA: Diagnosis not present

## 2013-05-04 DIAGNOSIS — Z4889 Encounter for other specified surgical aftercare: Secondary | ICD-10-CM | POA: Diagnosis not present

## 2013-05-04 DIAGNOSIS — Z5189 Encounter for other specified aftercare: Secondary | ICD-10-CM | POA: Diagnosis not present

## 2013-05-04 DIAGNOSIS — I4891 Unspecified atrial fibrillation: Secondary | ICD-10-CM | POA: Diagnosis not present

## 2013-05-04 DIAGNOSIS — E785 Hyperlipidemia, unspecified: Secondary | ICD-10-CM | POA: Diagnosis not present

## 2013-05-06 DIAGNOSIS — I2510 Atherosclerotic heart disease of native coronary artery without angina pectoris: Secondary | ICD-10-CM | POA: Diagnosis not present

## 2013-05-06 DIAGNOSIS — E119 Type 2 diabetes mellitus without complications: Secondary | ICD-10-CM | POA: Diagnosis not present

## 2013-05-06 DIAGNOSIS — Z4889 Encounter for other specified surgical aftercare: Secondary | ICD-10-CM | POA: Diagnosis not present

## 2013-05-06 DIAGNOSIS — I10 Essential (primary) hypertension: Secondary | ICD-10-CM | POA: Diagnosis not present

## 2013-05-31 DIAGNOSIS — I4891 Unspecified atrial fibrillation: Secondary | ICD-10-CM | POA: Diagnosis not present

## 2013-05-31 DIAGNOSIS — I10 Essential (primary) hypertension: Secondary | ICD-10-CM | POA: Diagnosis not present

## 2013-05-31 DIAGNOSIS — IMO0002 Reserved for concepts with insufficient information to code with codable children: Secondary | ICD-10-CM | POA: Diagnosis not present

## 2013-05-31 DIAGNOSIS — Z48812 Encounter for surgical aftercare following surgery on the circulatory system: Secondary | ICD-10-CM | POA: Diagnosis not present

## 2013-05-31 DIAGNOSIS — R262 Difficulty in walking, not elsewhere classified: Secondary | ICD-10-CM | POA: Diagnosis not present

## 2013-05-31 DIAGNOSIS — E785 Hyperlipidemia, unspecified: Secondary | ICD-10-CM | POA: Diagnosis not present

## 2013-05-31 DIAGNOSIS — J90 Pleural effusion, not elsewhere classified: Secondary | ICD-10-CM | POA: Diagnosis not present

## 2013-05-31 DIAGNOSIS — Z7901 Long term (current) use of anticoagulants: Secondary | ICD-10-CM | POA: Diagnosis not present

## 2013-05-31 DIAGNOSIS — Z8669 Personal history of other diseases of the nervous system and sense organs: Secondary | ICD-10-CM | POA: Diagnosis not present

## 2013-05-31 DIAGNOSIS — E119 Type 2 diabetes mellitus without complications: Secondary | ICD-10-CM | POA: Diagnosis not present

## 2013-05-31 DIAGNOSIS — I2510 Atherosclerotic heart disease of native coronary artery without angina pectoris: Secondary | ICD-10-CM | POA: Diagnosis not present

## 2013-06-04 DIAGNOSIS — E119 Type 2 diabetes mellitus without complications: Secondary | ICD-10-CM | POA: Diagnosis not present

## 2013-06-04 DIAGNOSIS — Z48812 Encounter for surgical aftercare following surgery on the circulatory system: Secondary | ICD-10-CM | POA: Diagnosis not present

## 2013-06-04 DIAGNOSIS — I2510 Atherosclerotic heart disease of native coronary artery without angina pectoris: Secondary | ICD-10-CM | POA: Diagnosis not present

## 2013-06-04 DIAGNOSIS — Z7901 Long term (current) use of anticoagulants: Secondary | ICD-10-CM | POA: Diagnosis not present

## 2013-06-04 DIAGNOSIS — R262 Difficulty in walking, not elsewhere classified: Secondary | ICD-10-CM | POA: Diagnosis not present

## 2013-06-04 DIAGNOSIS — IMO0002 Reserved for concepts with insufficient information to code with codable children: Secondary | ICD-10-CM | POA: Diagnosis not present

## 2013-06-05 DIAGNOSIS — Z7901 Long term (current) use of anticoagulants: Secondary | ICD-10-CM | POA: Diagnosis not present

## 2013-06-05 DIAGNOSIS — IMO0002 Reserved for concepts with insufficient information to code with codable children: Secondary | ICD-10-CM | POA: Diagnosis not present

## 2013-06-05 DIAGNOSIS — Z48812 Encounter for surgical aftercare following surgery on the circulatory system: Secondary | ICD-10-CM | POA: Diagnosis not present

## 2013-06-05 DIAGNOSIS — R262 Difficulty in walking, not elsewhere classified: Secondary | ICD-10-CM | POA: Diagnosis not present

## 2013-06-05 DIAGNOSIS — E119 Type 2 diabetes mellitus without complications: Secondary | ICD-10-CM | POA: Diagnosis not present

## 2013-06-05 DIAGNOSIS — I2510 Atherosclerotic heart disease of native coronary artery without angina pectoris: Secondary | ICD-10-CM | POA: Diagnosis not present

## 2013-06-06 DIAGNOSIS — IMO0002 Reserved for concepts with insufficient information to code with codable children: Secondary | ICD-10-CM | POA: Diagnosis not present

## 2013-06-06 DIAGNOSIS — Z48812 Encounter for surgical aftercare following surgery on the circulatory system: Secondary | ICD-10-CM | POA: Diagnosis not present

## 2013-06-06 DIAGNOSIS — Z7901 Long term (current) use of anticoagulants: Secondary | ICD-10-CM | POA: Diagnosis not present

## 2013-06-06 DIAGNOSIS — I2510 Atherosclerotic heart disease of native coronary artery without angina pectoris: Secondary | ICD-10-CM | POA: Diagnosis not present

## 2013-06-06 DIAGNOSIS — E119 Type 2 diabetes mellitus without complications: Secondary | ICD-10-CM | POA: Diagnosis not present

## 2013-06-06 DIAGNOSIS — R262 Difficulty in walking, not elsewhere classified: Secondary | ICD-10-CM | POA: Diagnosis not present

## 2013-06-12 DIAGNOSIS — IMO0002 Reserved for concepts with insufficient information to code with codable children: Secondary | ICD-10-CM | POA: Diagnosis not present

## 2013-06-12 DIAGNOSIS — Z48812 Encounter for surgical aftercare following surgery on the circulatory system: Secondary | ICD-10-CM | POA: Diagnosis not present

## 2013-06-12 DIAGNOSIS — R262 Difficulty in walking, not elsewhere classified: Secondary | ICD-10-CM | POA: Diagnosis not present

## 2013-06-12 DIAGNOSIS — E119 Type 2 diabetes mellitus without complications: Secondary | ICD-10-CM | POA: Diagnosis not present

## 2013-06-12 DIAGNOSIS — I4891 Unspecified atrial fibrillation: Secondary | ICD-10-CM | POA: Diagnosis not present

## 2013-06-12 DIAGNOSIS — Z7901 Long term (current) use of anticoagulants: Secondary | ICD-10-CM | POA: Diagnosis not present

## 2013-06-12 DIAGNOSIS — I2510 Atherosclerotic heart disease of native coronary artery without angina pectoris: Secondary | ICD-10-CM | POA: Diagnosis not present

## 2013-06-13 DIAGNOSIS — E119 Type 2 diabetes mellitus without complications: Secondary | ICD-10-CM | POA: Diagnosis not present

## 2013-06-13 DIAGNOSIS — R262 Difficulty in walking, not elsewhere classified: Secondary | ICD-10-CM | POA: Diagnosis not present

## 2013-06-13 DIAGNOSIS — Z48812 Encounter for surgical aftercare following surgery on the circulatory system: Secondary | ICD-10-CM | POA: Diagnosis not present

## 2013-06-13 DIAGNOSIS — IMO0002 Reserved for concepts with insufficient information to code with codable children: Secondary | ICD-10-CM | POA: Diagnosis not present

## 2013-06-13 DIAGNOSIS — Z7901 Long term (current) use of anticoagulants: Secondary | ICD-10-CM | POA: Diagnosis not present

## 2013-06-13 DIAGNOSIS — I2510 Atherosclerotic heart disease of native coronary artery without angina pectoris: Secondary | ICD-10-CM | POA: Diagnosis not present

## 2013-06-14 DIAGNOSIS — Z7901 Long term (current) use of anticoagulants: Secondary | ICD-10-CM | POA: Diagnosis not present

## 2013-06-14 DIAGNOSIS — E119 Type 2 diabetes mellitus without complications: Secondary | ICD-10-CM | POA: Diagnosis not present

## 2013-06-14 DIAGNOSIS — IMO0002 Reserved for concepts with insufficient information to code with codable children: Secondary | ICD-10-CM | POA: Diagnosis not present

## 2013-06-14 DIAGNOSIS — Z48812 Encounter for surgical aftercare following surgery on the circulatory system: Secondary | ICD-10-CM | POA: Diagnosis not present

## 2013-06-14 DIAGNOSIS — R262 Difficulty in walking, not elsewhere classified: Secondary | ICD-10-CM | POA: Diagnosis not present

## 2013-06-14 DIAGNOSIS — I2510 Atherosclerotic heart disease of native coronary artery without angina pectoris: Secondary | ICD-10-CM | POA: Diagnosis not present

## 2013-06-15 DIAGNOSIS — Z7901 Long term (current) use of anticoagulants: Secondary | ICD-10-CM | POA: Diagnosis not present

## 2013-06-15 DIAGNOSIS — R262 Difficulty in walking, not elsewhere classified: Secondary | ICD-10-CM | POA: Diagnosis not present

## 2013-06-15 DIAGNOSIS — I2510 Atherosclerotic heart disease of native coronary artery without angina pectoris: Secondary | ICD-10-CM | POA: Diagnosis not present

## 2013-06-15 DIAGNOSIS — IMO0002 Reserved for concepts with insufficient information to code with codable children: Secondary | ICD-10-CM | POA: Diagnosis not present

## 2013-06-15 DIAGNOSIS — Z48812 Encounter for surgical aftercare following surgery on the circulatory system: Secondary | ICD-10-CM | POA: Diagnosis not present

## 2013-06-15 DIAGNOSIS — E119 Type 2 diabetes mellitus without complications: Secondary | ICD-10-CM | POA: Diagnosis not present

## 2013-06-19 DIAGNOSIS — IMO0002 Reserved for concepts with insufficient information to code with codable children: Secondary | ICD-10-CM | POA: Diagnosis not present

## 2013-06-19 DIAGNOSIS — R262 Difficulty in walking, not elsewhere classified: Secondary | ICD-10-CM | POA: Diagnosis not present

## 2013-06-19 DIAGNOSIS — I2510 Atherosclerotic heart disease of native coronary artery without angina pectoris: Secondary | ICD-10-CM | POA: Diagnosis not present

## 2013-06-19 DIAGNOSIS — Z48812 Encounter for surgical aftercare following surgery on the circulatory system: Secondary | ICD-10-CM | POA: Diagnosis not present

## 2013-06-19 DIAGNOSIS — Z7901 Long term (current) use of anticoagulants: Secondary | ICD-10-CM | POA: Diagnosis not present

## 2013-06-19 DIAGNOSIS — E119 Type 2 diabetes mellitus without complications: Secondary | ICD-10-CM | POA: Diagnosis not present

## 2013-06-20 DIAGNOSIS — Z48812 Encounter for surgical aftercare following surgery on the circulatory system: Secondary | ICD-10-CM | POA: Diagnosis not present

## 2013-06-20 DIAGNOSIS — E119 Type 2 diabetes mellitus without complications: Secondary | ICD-10-CM | POA: Diagnosis not present

## 2013-06-20 DIAGNOSIS — Z7901 Long term (current) use of anticoagulants: Secondary | ICD-10-CM | POA: Diagnosis not present

## 2013-06-20 DIAGNOSIS — IMO0002 Reserved for concepts with insufficient information to code with codable children: Secondary | ICD-10-CM | POA: Diagnosis not present

## 2013-06-20 DIAGNOSIS — R262 Difficulty in walking, not elsewhere classified: Secondary | ICD-10-CM | POA: Diagnosis not present

## 2013-06-20 DIAGNOSIS — I2510 Atherosclerotic heart disease of native coronary artery without angina pectoris: Secondary | ICD-10-CM | POA: Diagnosis not present

## 2013-06-21 DIAGNOSIS — I2510 Atherosclerotic heart disease of native coronary artery without angina pectoris: Secondary | ICD-10-CM | POA: Diagnosis not present

## 2013-06-21 DIAGNOSIS — E119 Type 2 diabetes mellitus without complications: Secondary | ICD-10-CM | POA: Diagnosis not present

## 2013-06-21 DIAGNOSIS — Z48812 Encounter for surgical aftercare following surgery on the circulatory system: Secondary | ICD-10-CM | POA: Diagnosis not present

## 2013-06-21 DIAGNOSIS — IMO0002 Reserved for concepts with insufficient information to code with codable children: Secondary | ICD-10-CM | POA: Diagnosis not present

## 2013-06-21 DIAGNOSIS — R262 Difficulty in walking, not elsewhere classified: Secondary | ICD-10-CM | POA: Diagnosis not present

## 2013-06-21 DIAGNOSIS — Z7901 Long term (current) use of anticoagulants: Secondary | ICD-10-CM | POA: Diagnosis not present

## 2013-06-22 DIAGNOSIS — I2510 Atherosclerotic heart disease of native coronary artery without angina pectoris: Secondary | ICD-10-CM | POA: Diagnosis not present

## 2013-06-22 DIAGNOSIS — Z48812 Encounter for surgical aftercare following surgery on the circulatory system: Secondary | ICD-10-CM | POA: Diagnosis not present

## 2013-06-22 DIAGNOSIS — IMO0002 Reserved for concepts with insufficient information to code with codable children: Secondary | ICD-10-CM | POA: Diagnosis not present

## 2013-06-22 DIAGNOSIS — E119 Type 2 diabetes mellitus without complications: Secondary | ICD-10-CM | POA: Diagnosis not present

## 2013-06-22 DIAGNOSIS — R262 Difficulty in walking, not elsewhere classified: Secondary | ICD-10-CM | POA: Diagnosis not present

## 2013-06-22 DIAGNOSIS — Z7901 Long term (current) use of anticoagulants: Secondary | ICD-10-CM | POA: Diagnosis not present

## 2013-06-25 DIAGNOSIS — I2510 Atherosclerotic heart disease of native coronary artery without angina pectoris: Secondary | ICD-10-CM | POA: Diagnosis not present

## 2013-06-25 DIAGNOSIS — Z7901 Long term (current) use of anticoagulants: Secondary | ICD-10-CM | POA: Diagnosis not present

## 2013-06-25 DIAGNOSIS — R262 Difficulty in walking, not elsewhere classified: Secondary | ICD-10-CM | POA: Diagnosis not present

## 2013-06-25 DIAGNOSIS — Z48812 Encounter for surgical aftercare following surgery on the circulatory system: Secondary | ICD-10-CM | POA: Diagnosis not present

## 2013-06-25 DIAGNOSIS — IMO0002 Reserved for concepts with insufficient information to code with codable children: Secondary | ICD-10-CM | POA: Diagnosis not present

## 2013-06-25 DIAGNOSIS — E119 Type 2 diabetes mellitus without complications: Secondary | ICD-10-CM | POA: Diagnosis not present

## 2013-06-26 DIAGNOSIS — IMO0002 Reserved for concepts with insufficient information to code with codable children: Secondary | ICD-10-CM | POA: Diagnosis not present

## 2013-06-26 DIAGNOSIS — R262 Difficulty in walking, not elsewhere classified: Secondary | ICD-10-CM | POA: Diagnosis not present

## 2013-06-26 DIAGNOSIS — Z7901 Long term (current) use of anticoagulants: Secondary | ICD-10-CM | POA: Diagnosis not present

## 2013-06-26 DIAGNOSIS — I2510 Atherosclerotic heart disease of native coronary artery without angina pectoris: Secondary | ICD-10-CM | POA: Diagnosis not present

## 2013-06-26 DIAGNOSIS — Z48812 Encounter for surgical aftercare following surgery on the circulatory system: Secondary | ICD-10-CM | POA: Diagnosis not present

## 2013-06-26 DIAGNOSIS — E119 Type 2 diabetes mellitus without complications: Secondary | ICD-10-CM | POA: Diagnosis not present

## 2013-06-27 DIAGNOSIS — I2510 Atherosclerotic heart disease of native coronary artery without angina pectoris: Secondary | ICD-10-CM | POA: Diagnosis not present

## 2013-06-27 DIAGNOSIS — E119 Type 2 diabetes mellitus without complications: Secondary | ICD-10-CM | POA: Diagnosis not present

## 2013-06-27 DIAGNOSIS — R262 Difficulty in walking, not elsewhere classified: Secondary | ICD-10-CM | POA: Diagnosis not present

## 2013-06-27 DIAGNOSIS — Z7901 Long term (current) use of anticoagulants: Secondary | ICD-10-CM | POA: Diagnosis not present

## 2013-06-27 DIAGNOSIS — IMO0002 Reserved for concepts with insufficient information to code with codable children: Secondary | ICD-10-CM | POA: Diagnosis not present

## 2013-06-27 DIAGNOSIS — Z48812 Encounter for surgical aftercare following surgery on the circulatory system: Secondary | ICD-10-CM | POA: Diagnosis not present

## 2013-06-28 DIAGNOSIS — IMO0002 Reserved for concepts with insufficient information to code with codable children: Secondary | ICD-10-CM | POA: Diagnosis not present

## 2013-06-28 DIAGNOSIS — E119 Type 2 diabetes mellitus without complications: Secondary | ICD-10-CM | POA: Diagnosis not present

## 2013-06-28 DIAGNOSIS — Z7901 Long term (current) use of anticoagulants: Secondary | ICD-10-CM | POA: Diagnosis not present

## 2013-06-28 DIAGNOSIS — R262 Difficulty in walking, not elsewhere classified: Secondary | ICD-10-CM | POA: Diagnosis not present

## 2013-06-28 DIAGNOSIS — I2510 Atherosclerotic heart disease of native coronary artery without angina pectoris: Secondary | ICD-10-CM | POA: Diagnosis not present

## 2013-06-28 DIAGNOSIS — Z48812 Encounter for surgical aftercare following surgery on the circulatory system: Secondary | ICD-10-CM | POA: Diagnosis not present

## 2013-06-29 DIAGNOSIS — Z79899 Other long term (current) drug therapy: Secondary | ICD-10-CM | POA: Diagnosis not present

## 2013-06-29 DIAGNOSIS — R079 Chest pain, unspecified: Secondary | ICD-10-CM | POA: Diagnosis not present

## 2013-07-03 DIAGNOSIS — Z48812 Encounter for surgical aftercare following surgery on the circulatory system: Secondary | ICD-10-CM | POA: Diagnosis not present

## 2013-07-03 DIAGNOSIS — E119 Type 2 diabetes mellitus without complications: Secondary | ICD-10-CM | POA: Diagnosis not present

## 2013-07-03 DIAGNOSIS — R262 Difficulty in walking, not elsewhere classified: Secondary | ICD-10-CM | POA: Diagnosis not present

## 2013-07-03 DIAGNOSIS — IMO0002 Reserved for concepts with insufficient information to code with codable children: Secondary | ICD-10-CM | POA: Diagnosis not present

## 2013-07-03 DIAGNOSIS — I2510 Atherosclerotic heart disease of native coronary artery without angina pectoris: Secondary | ICD-10-CM | POA: Diagnosis not present

## 2013-07-03 DIAGNOSIS — Z7901 Long term (current) use of anticoagulants: Secondary | ICD-10-CM | POA: Diagnosis not present

## 2013-07-05 DIAGNOSIS — R262 Difficulty in walking, not elsewhere classified: Secondary | ICD-10-CM | POA: Diagnosis not present

## 2013-07-05 DIAGNOSIS — I2510 Atherosclerotic heart disease of native coronary artery without angina pectoris: Secondary | ICD-10-CM | POA: Diagnosis not present

## 2013-07-05 DIAGNOSIS — IMO0002 Reserved for concepts with insufficient information to code with codable children: Secondary | ICD-10-CM | POA: Diagnosis not present

## 2013-07-05 DIAGNOSIS — E119 Type 2 diabetes mellitus without complications: Secondary | ICD-10-CM | POA: Diagnosis not present

## 2013-07-05 DIAGNOSIS — Z48812 Encounter for surgical aftercare following surgery on the circulatory system: Secondary | ICD-10-CM | POA: Diagnosis not present

## 2013-07-05 DIAGNOSIS — Z7901 Long term (current) use of anticoagulants: Secondary | ICD-10-CM | POA: Diagnosis not present

## 2013-07-06 DIAGNOSIS — E119 Type 2 diabetes mellitus without complications: Secondary | ICD-10-CM | POA: Diagnosis not present

## 2013-07-06 DIAGNOSIS — Z7901 Long term (current) use of anticoagulants: Secondary | ICD-10-CM | POA: Diagnosis not present

## 2013-07-06 DIAGNOSIS — Z48812 Encounter for surgical aftercare following surgery on the circulatory system: Secondary | ICD-10-CM | POA: Diagnosis not present

## 2013-07-06 DIAGNOSIS — I2510 Atherosclerotic heart disease of native coronary artery without angina pectoris: Secondary | ICD-10-CM | POA: Diagnosis not present

## 2013-07-06 DIAGNOSIS — IMO0002 Reserved for concepts with insufficient information to code with codable children: Secondary | ICD-10-CM | POA: Diagnosis not present

## 2013-07-06 DIAGNOSIS — R262 Difficulty in walking, not elsewhere classified: Secondary | ICD-10-CM | POA: Diagnosis not present

## 2013-07-10 DIAGNOSIS — Z7901 Long term (current) use of anticoagulants: Secondary | ICD-10-CM | POA: Diagnosis not present

## 2013-07-10 DIAGNOSIS — I10 Essential (primary) hypertension: Secondary | ICD-10-CM | POA: Diagnosis not present

## 2013-07-10 DIAGNOSIS — I2510 Atherosclerotic heart disease of native coronary artery without angina pectoris: Secondary | ICD-10-CM | POA: Diagnosis not present

## 2013-07-10 DIAGNOSIS — I4891 Unspecified atrial fibrillation: Secondary | ICD-10-CM | POA: Diagnosis not present

## 2013-07-11 DIAGNOSIS — E119 Type 2 diabetes mellitus without complications: Secondary | ICD-10-CM | POA: Diagnosis not present

## 2013-07-11 DIAGNOSIS — I2510 Atherosclerotic heart disease of native coronary artery without angina pectoris: Secondary | ICD-10-CM | POA: Diagnosis not present

## 2013-07-11 DIAGNOSIS — Z7901 Long term (current) use of anticoagulants: Secondary | ICD-10-CM | POA: Diagnosis not present

## 2013-07-11 DIAGNOSIS — R262 Difficulty in walking, not elsewhere classified: Secondary | ICD-10-CM | POA: Diagnosis not present

## 2013-07-11 DIAGNOSIS — IMO0002 Reserved for concepts with insufficient information to code with codable children: Secondary | ICD-10-CM | POA: Diagnosis not present

## 2013-07-11 DIAGNOSIS — Z48812 Encounter for surgical aftercare following surgery on the circulatory system: Secondary | ICD-10-CM | POA: Diagnosis not present

## 2013-07-13 DIAGNOSIS — Z48812 Encounter for surgical aftercare following surgery on the circulatory system: Secondary | ICD-10-CM | POA: Diagnosis not present

## 2013-07-13 DIAGNOSIS — I2510 Atherosclerotic heart disease of native coronary artery without angina pectoris: Secondary | ICD-10-CM | POA: Diagnosis not present

## 2013-07-13 DIAGNOSIS — Z7901 Long term (current) use of anticoagulants: Secondary | ICD-10-CM | POA: Diagnosis not present

## 2013-07-13 DIAGNOSIS — R262 Difficulty in walking, not elsewhere classified: Secondary | ICD-10-CM | POA: Diagnosis not present

## 2013-07-13 DIAGNOSIS — E119 Type 2 diabetes mellitus without complications: Secondary | ICD-10-CM | POA: Diagnosis not present

## 2013-07-13 DIAGNOSIS — IMO0002 Reserved for concepts with insufficient information to code with codable children: Secondary | ICD-10-CM | POA: Diagnosis not present

## 2013-07-16 DIAGNOSIS — I2510 Atherosclerotic heart disease of native coronary artery without angina pectoris: Secondary | ICD-10-CM | POA: Diagnosis not present

## 2013-07-16 DIAGNOSIS — R262 Difficulty in walking, not elsewhere classified: Secondary | ICD-10-CM | POA: Diagnosis not present

## 2013-07-16 DIAGNOSIS — E119 Type 2 diabetes mellitus without complications: Secondary | ICD-10-CM | POA: Diagnosis not present

## 2013-07-16 DIAGNOSIS — Z7901 Long term (current) use of anticoagulants: Secondary | ICD-10-CM | POA: Diagnosis not present

## 2013-07-16 DIAGNOSIS — IMO0002 Reserved for concepts with insufficient information to code with codable children: Secondary | ICD-10-CM | POA: Diagnosis not present

## 2013-07-16 DIAGNOSIS — Z48812 Encounter for surgical aftercare following surgery on the circulatory system: Secondary | ICD-10-CM | POA: Diagnosis not present

## 2013-07-17 DIAGNOSIS — R262 Difficulty in walking, not elsewhere classified: Secondary | ICD-10-CM | POA: Diagnosis not present

## 2013-07-17 DIAGNOSIS — E119 Type 2 diabetes mellitus without complications: Secondary | ICD-10-CM | POA: Diagnosis not present

## 2013-07-17 DIAGNOSIS — I2510 Atherosclerotic heart disease of native coronary artery without angina pectoris: Secondary | ICD-10-CM | POA: Diagnosis not present

## 2013-07-17 DIAGNOSIS — IMO0002 Reserved for concepts with insufficient information to code with codable children: Secondary | ICD-10-CM | POA: Diagnosis not present

## 2013-07-17 DIAGNOSIS — Z7901 Long term (current) use of anticoagulants: Secondary | ICD-10-CM | POA: Diagnosis not present

## 2013-07-17 DIAGNOSIS — Z48812 Encounter for surgical aftercare following surgery on the circulatory system: Secondary | ICD-10-CM | POA: Diagnosis not present

## 2013-07-20 DIAGNOSIS — Z7901 Long term (current) use of anticoagulants: Secondary | ICD-10-CM | POA: Diagnosis not present

## 2013-07-20 DIAGNOSIS — E119 Type 2 diabetes mellitus without complications: Secondary | ICD-10-CM | POA: Diagnosis not present

## 2013-07-20 DIAGNOSIS — I2510 Atherosclerotic heart disease of native coronary artery without angina pectoris: Secondary | ICD-10-CM | POA: Diagnosis not present

## 2013-07-20 DIAGNOSIS — R262 Difficulty in walking, not elsewhere classified: Secondary | ICD-10-CM | POA: Diagnosis not present

## 2013-07-20 DIAGNOSIS — IMO0002 Reserved for concepts with insufficient information to code with codable children: Secondary | ICD-10-CM | POA: Diagnosis not present

## 2013-07-20 DIAGNOSIS — Z48812 Encounter for surgical aftercare following surgery on the circulatory system: Secondary | ICD-10-CM | POA: Diagnosis not present

## 2013-07-23 DIAGNOSIS — IMO0002 Reserved for concepts with insufficient information to code with codable children: Secondary | ICD-10-CM | POA: Diagnosis not present

## 2013-07-23 DIAGNOSIS — I2510 Atherosclerotic heart disease of native coronary artery without angina pectoris: Secondary | ICD-10-CM | POA: Diagnosis not present

## 2013-07-23 DIAGNOSIS — R262 Difficulty in walking, not elsewhere classified: Secondary | ICD-10-CM | POA: Diagnosis not present

## 2013-07-23 DIAGNOSIS — Z7901 Long term (current) use of anticoagulants: Secondary | ICD-10-CM | POA: Diagnosis not present

## 2013-07-23 DIAGNOSIS — E119 Type 2 diabetes mellitus without complications: Secondary | ICD-10-CM | POA: Diagnosis not present

## 2013-07-23 DIAGNOSIS — Z48812 Encounter for surgical aftercare following surgery on the circulatory system: Secondary | ICD-10-CM | POA: Diagnosis not present

## 2013-07-27 DIAGNOSIS — E119 Type 2 diabetes mellitus without complications: Secondary | ICD-10-CM | POA: Diagnosis not present

## 2013-07-27 DIAGNOSIS — IMO0002 Reserved for concepts with insufficient information to code with codable children: Secondary | ICD-10-CM | POA: Diagnosis not present

## 2013-07-27 DIAGNOSIS — I2510 Atherosclerotic heart disease of native coronary artery without angina pectoris: Secondary | ICD-10-CM | POA: Diagnosis not present

## 2013-07-27 DIAGNOSIS — Z48812 Encounter for surgical aftercare following surgery on the circulatory system: Secondary | ICD-10-CM | POA: Diagnosis not present

## 2013-07-27 DIAGNOSIS — R262 Difficulty in walking, not elsewhere classified: Secondary | ICD-10-CM | POA: Diagnosis not present

## 2013-07-27 DIAGNOSIS — Z7901 Long term (current) use of anticoagulants: Secondary | ICD-10-CM | POA: Diagnosis not present

## 2013-08-10 DIAGNOSIS — Z7901 Long term (current) use of anticoagulants: Secondary | ICD-10-CM | POA: Diagnosis not present

## 2013-08-10 DIAGNOSIS — I4891 Unspecified atrial fibrillation: Secondary | ICD-10-CM | POA: Diagnosis not present

## 2013-08-24 DIAGNOSIS — Z7901 Long term (current) use of anticoagulants: Secondary | ICD-10-CM | POA: Diagnosis not present

## 2013-08-24 DIAGNOSIS — I4891 Unspecified atrial fibrillation: Secondary | ICD-10-CM | POA: Diagnosis not present

## 2013-08-29 DIAGNOSIS — I10 Essential (primary) hypertension: Secondary | ICD-10-CM | POA: Diagnosis not present

## 2013-08-29 DIAGNOSIS — Z23 Encounter for immunization: Secondary | ICD-10-CM | POA: Diagnosis not present

## 2013-08-29 DIAGNOSIS — I2510 Atherosclerotic heart disease of native coronary artery without angina pectoris: Secondary | ICD-10-CM | POA: Diagnosis not present

## 2013-08-29 DIAGNOSIS — E119 Type 2 diabetes mellitus without complications: Secondary | ICD-10-CM | POA: Diagnosis not present

## 2013-08-29 DIAGNOSIS — I8310 Varicose veins of unspecified lower extremity with inflammation: Secondary | ICD-10-CM | POA: Diagnosis not present

## 2013-08-29 DIAGNOSIS — E785 Hyperlipidemia, unspecified: Secondary | ICD-10-CM | POA: Diagnosis not present

## 2013-08-29 DIAGNOSIS — I4891 Unspecified atrial fibrillation: Secondary | ICD-10-CM | POA: Diagnosis not present

## 2013-09-21 ENCOUNTER — Ambulatory Visit (INDEPENDENT_AMBULATORY_CARE_PROVIDER_SITE_OTHER): Payer: Medicare Other | Admitting: Pharmacist

## 2013-09-21 DIAGNOSIS — I4891 Unspecified atrial fibrillation: Secondary | ICD-10-CM

## 2013-09-21 LAB — POCT INR: INR: 2

## 2013-10-11 ENCOUNTER — Other Ambulatory Visit: Payer: Self-pay

## 2013-10-31 ENCOUNTER — Ambulatory Visit (INDEPENDENT_AMBULATORY_CARE_PROVIDER_SITE_OTHER): Payer: Medicare Other | Admitting: *Deleted

## 2013-10-31 ENCOUNTER — Telehealth: Payer: Self-pay | Admitting: Cardiology

## 2013-10-31 ENCOUNTER — Encounter: Payer: Self-pay | Admitting: Radiology

## 2013-10-31 DIAGNOSIS — I4891 Unspecified atrial fibrillation: Secondary | ICD-10-CM

## 2013-10-31 LAB — POCT INR: INR: 2.2

## 2013-10-31 NOTE — Telephone Encounter (Signed)
Ordered heart monitor, pt was on the schedule at [facility] to have heart monitor put on today. We will try to get monitor put on today.

## 2013-10-31 NOTE — Progress Notes (Signed)
Patient ID: Bryan Pratt, male   DOB: [birth year], 72 y.o.   MRN: [id number] Enrolled in [employer]. Monitor to be mailed to pt.

## 2013-11-07 DIAGNOSIS — L219 Seborrheic dermatitis, unspecified: Secondary | ICD-10-CM | POA: Diagnosis not present

## 2013-11-07 DIAGNOSIS — D045 Carcinoma in situ of skin of trunk: Secondary | ICD-10-CM | POA: Diagnosis not present

## 2013-11-07 DIAGNOSIS — L570 Actinic keratosis: Secondary | ICD-10-CM | POA: Diagnosis not present

## 2013-11-07 DIAGNOSIS — C44519 Basal cell carcinoma of skin of other part of trunk: Secondary | ICD-10-CM | POA: Diagnosis not present

## 2013-11-08 ENCOUNTER — Encounter: Payer: Self-pay | Admitting: Interventional Cardiology

## 2013-11-08 ENCOUNTER — Encounter (INDEPENDENT_AMBULATORY_CARE_PROVIDER_SITE_OTHER): Payer: Medicare Other

## 2013-11-08 DIAGNOSIS — I4891 Unspecified atrial fibrillation: Secondary | ICD-10-CM

## 2013-11-09 ENCOUNTER — Telehealth: Payer: Self-pay | Admitting: Interventional Cardiology

## 2013-11-09 NOTE — Telephone Encounter (Signed)
Routing to [employer], CMA to print out Monday am, prior to patient's appointment at 11am.

## 2013-11-09 NOTE — Telephone Encounter (Signed)
New Message  Pt request that Dr. Eldridge Dace, calls [facility] to have them download the report or data for a review before appt on Monday 12/8/// please assist

## 2013-11-10 ENCOUNTER — Encounter: Payer: Self-pay | Admitting: Interventional Cardiology

## 2013-11-10 ENCOUNTER — Encounter: Payer: Self-pay | Admitting: *Deleted

## 2013-11-12 ENCOUNTER — Telehealth: Payer: Self-pay | Admitting: Cardiology

## 2013-11-12 ENCOUNTER — Ambulatory Visit (INDEPENDENT_AMBULATORY_CARE_PROVIDER_SITE_OTHER): Payer: Medicare Other | Admitting: Interventional Cardiology

## 2013-11-12 ENCOUNTER — Encounter: Payer: Self-pay | Admitting: Interventional Cardiology

## 2013-11-12 VITALS — BP 126/70 | HR 88 | Ht 69.0 in | Wt 237.8 lb

## 2013-11-12 DIAGNOSIS — I4891 Unspecified atrial fibrillation: Secondary | ICD-10-CM | POA: Diagnosis not present

## 2013-11-12 DIAGNOSIS — I2510 Atherosclerotic heart disease of native coronary artery without angina pectoris: Secondary | ICD-10-CM | POA: Diagnosis not present

## 2013-11-12 NOTE — Telephone Encounter (Signed)
Strips printed and ready for review at appt today.

## 2013-11-12 NOTE — Patient Instructions (Signed)
Your physician wants you to follow-up in: 6 months with Dr. Eldridge Dace. You will receive a reminder letter in the mail two months in advance. If you don't receive a letter, please call our office to schedule the follow-up appointment.  Your physician has recommended you make the following change in your medication:   1. Stop Amiodarone.

## 2013-11-12 NOTE — Telephone Encounter (Signed)
Dr. Eldridge Dace stopped pts amiodarone at [facility] today. FYI to [employer].

## 2013-11-12 NOTE — Telephone Encounter (Signed)
Patient notified to increase warfarin to 7.5 mg qd except 5 mg Tuesday due to amiodarone stopping, and recheck INR in 1 week - appointment made.  Patient shows verbal understanding of plan.

## 2013-11-12 NOTE — Progress Notes (Signed)
Patient ID: Bryan Pratt, male   DOB: 07/04/1941, 72 y.o.   MRN: [id number]    [address] [place], [place]  [address] Phone: [phone] Fax:  [phone]  Date:  11/12/2013   ID:  RALF KONOPKA, DOB 04/25/1941, MRN [id number]  PCP:  No primary provider on file.      History of Present Illness: STRYDER POITRA is a 72 y.o. male who has CAD. He has CABG, [facility]ze at [facility] on 04/27/13. He has a stressful life due to his wife's Alzheimers. He has muscle and joint pains. He is looking into a facility to place her. He is not exercising much. His wife fell and he reached to catch her. He thought he pulled a muscle. CXR was ok. He had swelling which was resolved with Lasix. His breathing is better post op. He is out working in the yard. He is on Coumadin for 3 months and then he is ok to go back on the Eliquis, per the surgeon. He still has some minor leg swelling.  Now wearing a monitor to look for  CAD/ASCVD:  c/o Dyspnea on exertion no change.  Denies : Chest pain.  Dizziness.  Leg edema.  Nitroglycerin.  Orthopnea.  Palpitations.  Syncope.     Wt Readings from Last 3 Encounters:  11/12/13 237 lb 12.8 oz (107.865 kg)  03/07/13 235 lb (106.595 kg)  03/07/13 235 lb (106.595 kg)     Past Medical History  Diagnosis Date  . Diabetes mellitus without complication   . Coronary atherosclerosis of native coronary artery   . Obesity   . Hyperlipidemia   . Atrial fibrillation   . CAD (coronary artery disease)     s/p cath, occluded LAD with collateral fillling, 2008, last cath 03/03/2009 with CTO of the LAD multivessell.  . Diabetes     Current Outpatient Prescriptions  Medication Sig Dispense Refill  . amiodarone (PACERONE) 200 MG tablet Take 1 tablet by mouth daily.      . Ascorbic Acid (VITAMIN C) 100 MG tablet Take 100 mg by mouth daily.      . beta carotene w/minerals (OCUVITE) tablet Take 1 tablet by mouth daily.      . Cyanocobalamin (VITAMIN B 12 PO) Take  by mouth daily.      Tery Sanfilippo Calcium (STOOL SOFTENER PO) Take 100 mg by mouth daily.      Marland Kitchen ezetimibe-simvastatin (VYTORIN) 10-40 MG per tablet Take 1 tablet by mouth daily.      . furosemide (LASIX) 40 MG tablet Take 1 tablet by mouth daily.      . metFORMIN (GLUCOPHAGE) 500 MG tablet Take by mouth daily.      . metoprolol tartrate (LOPRESSOR) 25 MG tablet Take 1 tablet by mouth 2 (two) times daily.      . Multiple Vitamins-Minerals (MULTIVITAMIN PO) Take by mouth daily.      . nitroGLYCERIN (NITROSTAT) 0.4 MG SL tablet Place 0.4 mg under the tongue every 5 (five) minutes as needed for chest pain.      . traMADol (ULTRAM) 50 MG tablet Take by mouth every 6 (six) hours as needed.      . warfarin (COUMADIN) 5 MG tablet as directed. As directed       No current facility-administered medications for this visit.    Allergies:    Allergies  Allergen Reactions  . Codeine   . Morphine And Related Nausea And Vomiting    Social History:  The patient  reports that he has never smoked. He does not have any smokeless tobacco history on file.   Family History:  The patient's family history includes CAD in his father.   ROS:  Please see the history of present illness.  No nausea, vomiting.  No fevers, chills.  No focal weakness.  No dysuria.   All other systems reviewed and negative.   PHYSICAL EXAM: VS:  BP 126/70  Pulse 88  Ht 5\' 9"  (1.753 m)  Wt 237 lb 12.8 oz (107.865 kg)  BMI 35.10 kg/m2  SpO2 94% Well nourished, well developed, in no acute distress HEENT: normal Neck: no JVD, no carotid bruits Cardiac:  normal S1, S2; RRR;  Lungs:  clear to auscultation bilaterally, no wheezing, rhonchi or rales Abd: soft, nontender, no hepatomegaly Ext: no edema Skin: warm and dry Neuro:   no focal abnormalities noted     ASSESSMENT AND PLAN:  Atrial fibrillation  Notes: Maintaining NSR. If monitor shows no AFib, will stop Coumadin. s/p Maze procedure and seems to be in NSR. He did have  some shortterm AFib post op. Stop amiodarone. If he does a month on a monitor without any atrial fibrillation, we could consider stopping eloquis and consider the Maze procedure successful.  2. Coronary atherosclerosis of native coronary artery  Notes: No angina. s/p CABG.  3. Essential hypertension, benign  Continue Metoprolol Tartrate Tablet, 25 mg, 1 tablet, by mouth, Twice a day Notes:   4. Anticoagulant long-term use  Notes: INR to be checked and coumadin dose to be adjusted by PharmD. please see his note for details.    Signed, Fredric Mare, MD, [facility] 11/12/2013 12:04 PM

## 2013-11-19 ENCOUNTER — Ambulatory Visit (INDEPENDENT_AMBULATORY_CARE_PROVIDER_SITE_OTHER): Payer: Medicare Other | Admitting: Pharmacist

## 2013-11-19 DIAGNOSIS — I4891 Unspecified atrial fibrillation: Secondary | ICD-10-CM

## 2013-11-19 LAB — POCT INR: INR: 1.6

## 2013-12-03 ENCOUNTER — Ambulatory Visit (INDEPENDENT_AMBULATORY_CARE_PROVIDER_SITE_OTHER): Payer: Medicare Other | Admitting: Pharmacist

## 2013-12-03 DIAGNOSIS — I4891 Unspecified atrial fibrillation: Secondary | ICD-10-CM | POA: Diagnosis not present

## 2013-12-03 LAB — POCT INR: INR: 6.5

## 2013-12-04 ENCOUNTER — Telehealth: Payer: Self-pay | Admitting: Cardiology

## 2013-12-04 NOTE — Telephone Encounter (Addendum)
Spoke with pt and he was asymptomatic at the time of the pause. Pts INR was out of range yesterday at 7.2 because he misunderstood instructions. Pt is to wear heart monitor until 12/07/13.

## 2013-12-04 NOTE — Telephone Encounter (Addendum)
Received abnormal notification from [facility]. Dr. Eldridge Dace reviewed strips and pt had a 4 second pause. Per Dr. Eldridge Dace pt should stop lopressor.

## 2013-12-05 MED ORDER — FUROSEMIDE 40 MG PO TABS: ORAL_TABLET | ORAL | Status: DC

## 2013-12-05 NOTE — Addendum Note (Signed)
Addended byOrlene Plum H on: 12/05/2013 02:13 PM   Modules accepted: Orders

## 2013-12-05 NOTE — Telephone Encounter (Signed)
Spoke with pt and he started to feel washed out this morning. Pt feels that if he makes any sudden moves he has to watch his movements. Pts BP this am was 136/85 and HR 70-80 bpm. Pt took his lasix pill this morning like normal and he feels he has urinated more than usual (lost some fluid). Pt is currently holding coumadin and starting back tomorrow because of elevated INR.

## 2013-12-05 NOTE — Telephone Encounter (Signed)
Pt aware. Meds updated.

## 2013-12-05 NOTE — Telephone Encounter (Signed)
Skip lasix tomorrow.  Restart at 20 mg daily for a week.  Increase back to 40 mg if swelling recurs.

## 2013-12-05 NOTE — Telephone Encounter (Signed)
Please advise 

## 2013-12-05 NOTE — Telephone Encounter (Signed)
Follow Up:  Pt states he coumadin and BP meds were stopped earlier this week. Pt states he feels washed out and would like to speak with Amy.

## 2013-12-10 ENCOUNTER — Encounter: Payer: Self-pay | Admitting: Interventional Cardiology

## 2013-12-10 ENCOUNTER — Ambulatory Visit (INDEPENDENT_AMBULATORY_CARE_PROVIDER_SITE_OTHER): Payer: Medicare Other | Admitting: Pharmacist

## 2013-12-10 ENCOUNTER — Telehealth: Payer: Self-pay | Admitting: Cardiology

## 2013-12-10 DIAGNOSIS — I4891 Unspecified atrial fibrillation: Secondary | ICD-10-CM

## 2013-12-10 LAB — POCT INR: INR: 1.6

## 2013-12-10 MED ORDER — WARFARIN SODIUM 5 MG PO TABS: ORAL_TABLET | ORAL | Status: DC

## 2013-12-10 NOTE — Telephone Encounter (Signed)
Refill done.  

## 2013-12-10 NOTE — Telephone Encounter (Signed)
Since the dosage has changed (taking more) on my Warfarin, the pharmacy will not fill the renewal prescription since the time is too early....or, I should say the insurance won't pay because it is too early...Marland KitchenHELP!!!!  Now, if my [facility] report is still showing no afibs, then all of the above may not matter, assuming Dr Irish Lack intends to stop the Warfarin.Marland KitchenMarland KitchenMarland KitchenLet me know....THANKS.Marland KitchenMarland KitchenMarland Kitchen

## 2013-12-12 ENCOUNTER — Encounter: Payer: Self-pay | Admitting: Pharmacist

## 2013-12-13 ENCOUNTER — Encounter: Payer: Self-pay | Admitting: Pharmacist

## 2013-12-15 DIAGNOSIS — M25569 Pain in unspecified knee: Secondary | ICD-10-CM | POA: Diagnosis not present

## 2013-12-15 DIAGNOSIS — E119 Type 2 diabetes mellitus without complications: Secondary | ICD-10-CM | POA: Diagnosis not present

## 2013-12-15 DIAGNOSIS — M25069 Hemarthrosis, unspecified knee: Secondary | ICD-10-CM | POA: Diagnosis not present

## 2013-12-15 DIAGNOSIS — M25469 Effusion, unspecified knee: Secondary | ICD-10-CM | POA: Diagnosis not present

## 2013-12-15 DIAGNOSIS — R52 Pain, unspecified: Secondary | ICD-10-CM | POA: Diagnosis not present

## 2013-12-15 DIAGNOSIS — R509 Fever, unspecified: Secondary | ICD-10-CM | POA: Diagnosis not present

## 2013-12-15 DIAGNOSIS — I10 Essential (primary) hypertension: Secondary | ICD-10-CM | POA: Diagnosis not present

## 2013-12-18 ENCOUNTER — Telehealth: Payer: Self-pay | Admitting: Cardiology

## 2013-12-18 NOTE — Telephone Encounter (Signed)
Dr. Hassell Done Interpretation: No AFIB. Ok to stop coumadin.

## 2013-12-19 ENCOUNTER — Ambulatory Visit: Payer: Self-pay | Admitting: Pharmacist

## 2013-12-19 DIAGNOSIS — I4891 Unspecified atrial fibrillation: Secondary | ICD-10-CM

## 2013-12-19 MED ORDER — ASPIRIN EC 81 MG PO TBEC: 81.0000 mg | DELAYED_RELEASE_TABLET | Freq: Every day | ORAL | Status: DC

## 2013-12-19 NOTE — Telephone Encounter (Signed)
Pt notified. Pt states he really doesn't want to take ASA 81mg  because of his macular degeneration. He states he has heard taking aspirin may not be good for the macular degeneration.

## 2013-12-19 NOTE — Telephone Encounter (Signed)
Okay to stop coumadin today, doesn't need to taper.  He should restart aspirin at 81 mg daily.  Please notify patient, and update meds.  Thanks.

## 2013-12-19 NOTE — Telephone Encounter (Signed)
Lmtrc, Ysidro Evert can pt stop coumadin abruptly or does he need to wean off?

## 2013-12-20 NOTE — Telephone Encounter (Signed)
Since CABG is fairly recent, would have him take the asa 81 mg daily.Marland Kitchenabdominal tenderness least for the first year after CABG.  Then we can relook.

## 2013-12-20 NOTE — Telephone Encounter (Signed)
Pt notified. Pt will take 81 ASA.

## 2014-02-22 ENCOUNTER — Other Ambulatory Visit: Payer: Self-pay

## 2014-02-22 MED ORDER — FUROSEMIDE 40 MG PO TABS: ORAL_TABLET | ORAL | Status: DC

## 2014-02-27 DIAGNOSIS — E119 Type 2 diabetes mellitus without complications: Secondary | ICD-10-CM | POA: Diagnosis not present

## 2014-02-27 DIAGNOSIS — H353190 Nonexudative age-related macular degeneration, unspecified eye, stage unspecified: Secondary | ICD-10-CM | POA: Diagnosis not present

## 2014-02-27 DIAGNOSIS — H2510 Age-related nuclear cataract, unspecified eye: Secondary | ICD-10-CM | POA: Diagnosis not present

## 2014-02-27 DIAGNOSIS — H04129 Dry eye syndrome of unspecified lacrimal gland: Secondary | ICD-10-CM | POA: Diagnosis not present

## 2014-03-20 DIAGNOSIS — E119 Type 2 diabetes mellitus without complications: Secondary | ICD-10-CM | POA: Diagnosis not present

## 2014-03-20 DIAGNOSIS — R040 Epistaxis: Secondary | ICD-10-CM | POA: Diagnosis not present

## 2014-03-20 DIAGNOSIS — I10 Essential (primary) hypertension: Secondary | ICD-10-CM | POA: Diagnosis not present

## 2014-03-20 DIAGNOSIS — E785 Hyperlipidemia, unspecified: Secondary | ICD-10-CM | POA: Diagnosis not present

## 2014-03-20 DIAGNOSIS — M2550 Pain in unspecified joint: Secondary | ICD-10-CM | POA: Diagnosis not present

## 2014-03-20 DIAGNOSIS — Z125 Encounter for screening for malignant neoplasm of prostate: Secondary | ICD-10-CM | POA: Diagnosis not present

## 2014-03-20 DIAGNOSIS — B029 Zoster without complications: Secondary | ICD-10-CM | POA: Diagnosis not present

## 2014-04-10 ENCOUNTER — Emergency Department (HOSPITAL_COMMUNITY): Payer: Medicare Other

## 2014-04-10 ENCOUNTER — Encounter (HOSPITAL_COMMUNITY): Payer: Self-pay | Admitting: Emergency Medicine

## 2014-04-10 ENCOUNTER — Observation Stay (HOSPITAL_COMMUNITY)
Admission: EM | Admit: 2014-04-10 | Discharge: 2014-04-11 | Disposition: A | Discharge status: Home / Self Care | Payer: Medicare Other | Attending: Internal Medicine | Admitting: Internal Medicine

## 2014-04-10 ENCOUNTER — Telehealth: Payer: Self-pay | Admitting: Interventional Cardiology

## 2014-04-10 DIAGNOSIS — Z7982 Long term (current) use of aspirin: Secondary | ICD-10-CM | POA: Insufficient documentation

## 2014-04-10 DIAGNOSIS — R109 Unspecified abdominal pain: Secondary | ICD-10-CM | POA: Diagnosis not present

## 2014-04-10 DIAGNOSIS — R141 Gas pain: Secondary | ICD-10-CM | POA: Diagnosis not present

## 2014-04-10 DIAGNOSIS — R937 Abnormal findings on diagnostic imaging of other parts of musculoskeletal system: Secondary | ICD-10-CM | POA: Insufficient documentation

## 2014-04-10 DIAGNOSIS — R079 Chest pain, unspecified: Secondary | ICD-10-CM

## 2014-04-10 DIAGNOSIS — E119 Type 2 diabetes mellitus without complications: Secondary | ICD-10-CM | POA: Diagnosis not present

## 2014-04-10 DIAGNOSIS — Z9861 Coronary angioplasty status: Secondary | ICD-10-CM | POA: Insufficient documentation

## 2014-04-10 DIAGNOSIS — E785 Hyperlipidemia, unspecified: Secondary | ICD-10-CM | POA: Diagnosis not present

## 2014-04-10 DIAGNOSIS — Z951 Presence of aortocoronary bypass graft: Secondary | ICD-10-CM | POA: Insufficient documentation

## 2014-04-10 DIAGNOSIS — I4891 Unspecified atrial fibrillation: Secondary | ICD-10-CM | POA: Insufficient documentation

## 2014-04-10 DIAGNOSIS — I2510 Atherosclerotic heart disease of native coronary artery without angina pectoris: Secondary | ICD-10-CM | POA: Diagnosis present

## 2014-04-10 DIAGNOSIS — I509 Heart failure, unspecified: Secondary | ICD-10-CM | POA: Diagnosis not present

## 2014-04-10 DIAGNOSIS — R0602 Shortness of breath: Secondary | ICD-10-CM | POA: Diagnosis not present

## 2014-04-10 DIAGNOSIS — E669 Obesity, unspecified: Secondary | ICD-10-CM | POA: Insufficient documentation

## 2014-04-10 DIAGNOSIS — R143 Flatulence: Secondary | ICD-10-CM

## 2014-04-10 DIAGNOSIS — I5043 Acute on chronic combined systolic (congestive) and diastolic (congestive) heart failure: Secondary | ICD-10-CM | POA: Diagnosis present

## 2014-04-10 DIAGNOSIS — K5900 Constipation, unspecified: Secondary | ICD-10-CM | POA: Diagnosis not present

## 2014-04-10 DIAGNOSIS — R142 Eructation: Secondary | ICD-10-CM

## 2014-04-10 LAB — URINALYSIS, ROUTINE W REFLEX MICROSCOPIC
Bilirubin Urine: NEGATIVE
Glucose, UA: NEGATIVE mg/dL
Hgb urine dipstick: NEGATIVE
Ketones, ur: 15 mg/dL — AB
LEUKOCYTES UA: NEGATIVE
NITRITE: NEGATIVE
Protein, ur: NEGATIVE mg/dL
Specific Gravity, Urine: 1.012 (ref 1.005–1.030)
Urobilinogen, UA: 1 mg/dL (ref 0.0–1.0)
pH: 6.5 (ref 5.0–8.0)

## 2014-04-10 LAB — BASIC METABOLIC PANEL
BUN: 12 mg/dL (ref 6–23)
CALCIUM: 9.9 mg/dL (ref 8.4–10.5)
CO2: 27 mEq/L (ref 19–32)
Chloride: 100 mEq/L (ref 96–112)
Creatinine, Ser: 0.7 mg/dL (ref 0.50–1.35)
GFR calc non Af Amer: >90 mL/min (ref >90)
GLUCOSE: 139 mg/dL — AB (ref 70–99)
Potassium: 4 mEq/L (ref 3.7–5.3)
SODIUM: 138 meq/L (ref 137–147)

## 2014-04-10 LAB — HEPATIC FUNCTION PANEL
ALBUMIN: 4 g/dL (ref 3.5–5.2)
ALT: 27 U/L (ref 0–53)
AST: 30 U/L (ref 0–37)
Alkaline Phosphatase: 90 U/L (ref 39–117)
BILIRUBIN INDIRECT: 1.1 mg/dL — AB (ref 0.3–0.9)
BILIRUBIN TOTAL: 1.4 mg/dL — AB (ref 0.3–1.2)
Bilirubin, Direct: 0.3 mg/dL (ref 0.0–0.3)
TOTAL PROTEIN: 7.5 g/dL (ref 6.0–8.3)

## 2014-04-10 LAB — LIPID PANEL
Cholesterol: 118 mg/dL (ref 0–200)
HDL: 65 mg/dL (ref >39)
LDL CALC: 40 mg/dL (ref 0–99)
TRIGLYCERIDES: 64 mg/dL (ref <150)
Total CHOL/HDL Ratio: 1.8 RATIO
VLDL: 13 mg/dL (ref 0–40)

## 2014-04-10 LAB — PRO B NATRIURETIC PEPTIDE: Pro B Natriuretic peptide (BNP): 345.8 pg/mL — ABNORMAL HIGH (ref 0–125)

## 2014-04-10 LAB — CBC
HCT: 47.9 % (ref 39.0–52.0)
HEMOGLOBIN: 16.9 g/dL (ref 13.0–17.0)
MCH: 32.5 pg (ref 26.0–34.0)
MCHC: 35.3 g/dL (ref 30.0–36.0)
MCV: 92.1 fL (ref 78.0–100.0)
Platelets: 190 10*3/uL (ref 150–400)
RBC: 5.2 MIL/uL (ref 4.22–5.81)
RDW: 14.6 % (ref 11.5–15.5)
WBC: 9 10*3/uL (ref 4.0–10.5)

## 2014-04-10 LAB — CBG MONITORING, ED: Glucose-Capillary: 191 mg/dL — ABNORMAL HIGH (ref 70–99)

## 2014-04-10 LAB — LIPASE, BLOOD: LIPASE: 13 U/L (ref 11–59)

## 2014-04-10 LAB — D-DIMER, QUANTITATIVE: D-Dimer, Quant: <0.27 ug/mL-FEU (ref 0.00–0.48)

## 2014-04-10 LAB — TROPONIN I: Troponin I: <0.3 ng/mL (ref <0.30)

## 2014-04-10 MED ORDER — INSULIN ASPART 100 UNIT/ML ~~LOC~~ SOLN: 0.0000 [IU] | Freq: Three times a day (TID) | SUBCUTANEOUS | Status: DC

## 2014-04-10 MED ORDER — HYDROMORPHONE HCL PF 1 MG/ML IJ SOLN
1.0000 mg | INTRAMUSCULAR | Status: DC | PRN
  Administered 2014-04-10: 1 mg via INTRAVENOUS
  Filled 2014-04-10: qty 1

## 2014-04-10 MED ORDER — DOCUSATE SODIUM 100 MG PO CAPS
100.0000 mg | ORAL_CAPSULE | Freq: Every day | ORAL | Status: DC
  Administered 2014-04-10 – 2014-04-11 (×2): 100 mg via ORAL
  Filled 2014-04-10 (×2): qty 1

## 2014-04-10 MED ORDER — IOHEXOL 300 MG/ML  SOLN
25.0000 mL | Freq: Once | INTRAMUSCULAR | Status: AC | PRN
  Administered 2014-04-10: 25 mL via ORAL

## 2014-04-10 MED ORDER — ASPIRIN EC 81 MG PO TBEC
81.0000 mg | DELAYED_RELEASE_TABLET | Freq: Every day | ORAL | Status: DC
  Administered 2014-04-11: 81 mg via ORAL
  Filled 2014-04-10: qty 1

## 2014-04-10 MED ORDER — HYDROMORPHONE HCL PF 1 MG/ML IJ SOLN: 1.0000 mg | INTRAMUSCULAR | Status: DC | PRN

## 2014-04-10 MED ORDER — EZETIMIBE-SIMVASTATIN 10-40 MG PO TABS
1.0000 | ORAL_TABLET | Freq: Every day | ORAL | Status: DC
  Administered 2014-04-11: 1 via ORAL
  Filled 2014-04-10: qty 1

## 2014-04-10 MED ORDER — VITAMIN C 250 MG PO TABS
250.0000 mg | ORAL_TABLET | Freq: Every day | ORAL | Status: DC
  Administered 2014-04-11: 250 mg via ORAL
  Filled 2014-04-10: qty 1

## 2014-04-10 MED ORDER — SODIUM CHLORIDE 0.9 % IV BOLUS (SEPSIS)
500.0000 mL | Freq: Once | INTRAVENOUS | Status: AC
  Administered 2014-04-10: 500 mL via INTRAVENOUS

## 2014-04-10 MED ORDER — FUROSEMIDE 40 MG PO TABS
40.0000 mg | ORAL_TABLET | Freq: Every day | ORAL | Status: DC
  Administered 2014-04-11: 40 mg via ORAL
  Filled 2014-04-10: qty 1

## 2014-04-10 MED ORDER — IOHEXOL 300 MG/ML  SOLN
100.0000 mL | Freq: Once | INTRAMUSCULAR | Status: AC | PRN
  Administered 2014-04-10: 100 mL via INTRAVENOUS

## 2014-04-10 MED ORDER — ASPIRIN 81 MG PO CHEW
243.0000 mg | CHEWABLE_TABLET | Freq: Once | ORAL | Status: AC
  Administered 2014-04-10: 243 mg via ORAL
  Filled 2014-04-10: qty 3

## 2014-04-10 MED ORDER — OCUVITE PO TABS
1.0000 | ORAL_TABLET | Freq: Every day | ORAL | Status: DC
  Administered 2014-04-11: 1 via ORAL
  Filled 2014-04-10: qty 1

## 2014-04-10 MED ORDER — ENOXAPARIN SODIUM 30 MG/0.3ML ~~LOC~~ SOLN
30.0000 mg | SUBCUTANEOUS | Status: DC
  Filled 2014-04-10: qty 0.3

## 2014-04-10 MED ORDER — NITROGLYCERIN 0.4 MG SL SUBL: 0.4000 mg | SUBLINGUAL_TABLET | SUBLINGUAL | Status: DC | PRN

## 2014-04-10 NOTE — Telephone Encounter (Signed)
New message     Up all night with chest pain, sob----when he takes a deep breath-it hurts worse.  bp is 150/100--pulse 105

## 2014-04-10 NOTE — ED Notes (Signed)
Pt reporting left sided cp for several weeks but has gotten worse after he did yardwork 2 days ago. Deep breathing makes pain worse. Cardiac hx. Reports SOB. Denies n/v. Intermittent H/a, has been feeling dizzy, uneasy gait. Pt is a x 4. Skin warm and dry.

## 2014-04-10 NOTE — Consult Note (Signed)
Cardiology IP Consult  Reason for Consult: chest and abdominal pain  Referring Physician: ED  HPI: Mr. Bryan Pratt is a 73 y.o.male with hx below relevant for chronic ischemic heart disease s/p CABG (04/2013), DM, obesity who presents to ED for evaluation of chest pain. Patient reports doing very well post-CABG until approximately 2 weeks ago. At that time, he developed fatigue and some effort dyspnea. 2 days ago, he went out to do some yardwork and began to have chest and belly pain with this activity. Episode was initially quite severe but did improve. However, it has persisted until he felt the need to present to ED for further evaluation today. Currently, he denies any chest discomfort but reports ongoing abdominal pain that is reproducible. Described as sharp and worse with inspiration.    Past Medical History  Diagnosis Date  . Diabetes mellitus without complication   . Coronary atherosclerosis of native coronary artery   . Obesity   . Hyperlipidemia   . Atrial fibrillation   . CAD (coronary artery disease)     s/p cath, occluded LAD with collateral fillling, 2008, last cath 03/03/2009 with CTO of the LAD multivessell.  . Diabetes     Past Surgical History  Procedure Laterality Date  . Knee surgery    . Tonsillectomy    . Cyst removal, cheek      Family History  Problem Relation Age of Onset  . CAD Father     Social History:  reports that he has never smoked. He does not have any smokeless tobacco history on file. His alcohol and drug histories are not on file.  Allergies:  Allergies  Allergen Reactions  . Codeine   . Morphine And Related Nausea And Vomiting    Current Facility-Administered Medications  Medication Dose Route Frequency Provider Last Rate Last Dose  . docusate sodium (COLACE) capsule 100 mg  100 mg Oral Daily [name], MD   100 mg at 04/10/14 2204  . [START ON 04/11/2014] insulin aspart (novoLOG) injection 0-9 Units  0-9 Units Subcutaneous TID WC [name], MD       Current Outpatient Prescriptions  Medication Sig Dispense Refill  . Ascorbic Acid (VITAMIN C) 100 MG tablet Take 100 mg by mouth daily.      Marland Kitchen aspirin EC 81 MG tablet Take 1 tablet (81 mg total) by mouth daily.  90 tablet  3  . beta carotene w/minerals (OCUVITE) tablet Take 1 tablet by mouth daily.      . Cyanocobalamin (VITAMIN B 12 PO) Take by mouth daily.      Mariane Baumgarten Calcium (STOOL SOFTENER PO) Take 100 mg by mouth daily.      Marland Kitchen ezetimibe-simvastatin (VYTORIN) 10-40 MG per tablet Take 1 tablet by mouth daily.      . furosemide (LASIX) 40 MG tablet Take 40 mg by mouth daily.      . metFORMIN (GLUCOPHAGE) 500 MG tablet Take by mouth daily.      . Multiple Vitamins-Minerals (MULTIVITAMIN PO) Take by mouth daily.      . traMADol (ULTRAM) 50 MG tablet Take by mouth every 6 (six) hours as needed.      . nitroGLYCERIN (NITROSTAT) 0.4 MG SL tablet Place 0.4 mg under the tongue every 5 (five) minutes as needed for chest pain.        ROS: A full review of systems is obtained and is negative except as noted in the HPI.  Physical Exam: Blood pressure 119/96, pulse  93, temperature 98.2 F (36.8 C), temperature source Oral, resp. rate 20, height 5\' 11"  (1.803 m), weight 108.41 kg (239 lb), SpO2 95.00%.  GENERAL: no acute distress.  EYES: Extra ocular movements are intact. There is no lid lag. Sclera is anicteric.  ENT: Oropharynx is clear. Dentition is within normal limits.  NECK: Supple.  LYMPH: There are no masses or lymphadenopathy present.  HEART: Tachycardic, regular rate and rhyth. No m/g/r. No JVD LUNGS: Clear to auscultation There are no rales, rhonchi, or wheezes.  ABDOMEN: Soft, mildly distended. Moderate TTP, particularly in upper quadrants. No rebound, rigidity.  EXTREMITIES: No clubbing, cyanosis, or edema.  PULSES: Carotids were +2 and equal bilaterally with no bruits. DP/PT pulses were +2 and equal bilaterally.  SKIN: Warm, dry, and intact.  NEUROLOGIC: The  patient was oriented to person, place, and time. No overt neurologic deficits were detected.  PSYCH: Normal judgment and insight, mood is appropriate.    Results: Results for orders placed during the hospital encounter of 04/10/14 (from the past 24 hour(s))  CBC     Status: None   Collection Time    04/10/14  1:16 PM      Result Value Ref Range   WBC 9.0  4.0 - 10.5 K/uL   RBC 5.20  4.22 - 5.81 MIL/uL   Hemoglobin 16.9  13.0 - 17.0 g/dL   HCT 47.9  39.0 - 52.0 %   MCV 92.1  78.0 - 100.0 fL   MCH 32.5  26.0 - 34.0 pg   MCHC 35.3  30.0 - 36.0 g/dL   RDW 14.6  11.5 - 15.5 %   Platelets 190  150 - 400 K/uL  BASIC METABOLIC PANEL     Status: Abnormal   Collection Time    04/10/14  1:16 PM      Result Value Ref Range   Sodium 138  137 - 147 mEq/L   Potassium 4.0  3.7 - 5.3 mEq/L   Chloride 100  96 - 112 mEq/L   CO2 27  19 - 32 mEq/L   Glucose, Bld 139 (*) 70 - 99 mg/dL   BUN 12  6 - 23 mg/dL   Creatinine, Ser 0.70  0.50 - 1.35 mg/dL   Calcium 9.9  8.4 - 10.5 mg/dL   GFR calc non Af Amer >90  >90 mL/min   GFR calc Af Amer >90  >90 mL/min  TROPONIN I     Status: None   Collection Time    04/10/14  2:52 PM      Result Value Ref Range   Troponin I <0.30  <0.30 ng/mL  HEPATIC FUNCTION PANEL     Status: Abnormal   Collection Time    04/10/14  3:14 PM      Result Value Ref Range   Total Protein 7.5  6.0 - 8.3 g/dL   Albumin 4.0  3.5 - 5.2 g/dL   AST 30  0 - 37 U/L   ALT 27  0 - 53 U/L   Alkaline Phosphatase 90  39 - 117 U/L   Total Bilirubin 1.4 (*) 0.3 - 1.2 mg/dL   Bilirubin, Direct 0.3  0.0 - 0.3 mg/dL   Indirect Bilirubin 1.1 (*) 0.3 - 0.9 mg/dL  LIPASE, BLOOD     Status: None   Collection Time    04/10/14  3:14 PM      Result Value Ref Range   Lipase 13  11 - 59 U/L  PRO B NATRIURETIC PEPTIDE  Status: Abnormal   Collection Time    04/10/14  3:15 PM      Result Value Ref Range   Pro B Natriuretic peptide (BNP) 345.8 (*) 0 - 125 pg/mL  URINALYSIS, ROUTINE W REFLEX  MICROSCOPIC     Status: Abnormal   Collection Time    04/10/14  5:36 PM      Result Value Ref Range   Color, Urine YELLOW  YELLOW   APPearance CLEAR  CLEAR   Specific Gravity, Urine 1.012  1.005 - 1.030   pH 6.5  5.0 - 8.0   Glucose, UA NEGATIVE  NEGATIVE mg/dL   Hgb urine dipstick NEGATIVE  NEGATIVE   Bilirubin Urine NEGATIVE  NEGATIVE   Ketones, ur 15 (*) NEGATIVE mg/dL   Protein, ur NEGATIVE  NEGATIVE mg/dL   Urobilinogen, UA 1.0  0.0 - 1.0 mg/dL   Nitrite NEGATIVE  NEGATIVE   Leukocytes, UA NEGATIVE  NEGATIVE    CXR: reviewed EKG: Sinus tachycardia. 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   Assessment: 1. Chest and abdominal pain, likely non-cardiac  2. Hx of chronic ischemic heart disease s/p CABG  Mr. Bryan Pratt presents with 48 hours of abdominal and chest pain. His CV exam is unremarkable; he does have abdominal tenderness. Episode appears to be non-cardiac in etiology.   Recs:  - agree with CT Angio Chest to exclude pulmonary embolus - rule out acute MI with serial troponins - maintain on telemetry - will continue to follow with primary team. If no other etiology of episode becomes apparent, would consider myocardial ischemia evaluation.   Theressa Stamps 04/10/2014, 10:06 PM

## 2014-04-10 NOTE — Telephone Encounter (Signed)
Received call directly from operator; patient c/o chest pain in left rib cage that radiates around to his back and down his left arm and SOB worse with a deep breath.  Patient denies injury.  Patient has known hx CAD and I advised him to call 911 for transport to the ER. Patient requests that I talk with Dr. Irish Lack who is in the office today to make certain he doesn't want to see him in the office.  I advised patient that this would delay treatment if this is a cardiac event and that his symptoms suggest that it is.  Patient asked me to go and talk with Dr. Irish Lack.  I spoke with Dr. Irish Lack in person who advised that patient proceed to the ER.  Patient verbalized agreement and understanding.  Cardiology card master paged.

## 2014-04-10 NOTE — H&P (Signed)
[facility] History and Physical  Bryan Pratt HKV:[id number] DOB: September 13, 1941 DOA: 04/10/2014  Referring physician: ER physician PCP: [name], MD   Chief Complaint: chest pain   HPI:  73 year old male with past medical history of diabetes, CAD, atrial fibrillation, obesity with cardiac catheter performed in 2008 with total occlusion of LAD with collateral filling and 2010 CT of LAD multivessel with triple bypass surgery performed on May 2014, with Maze procedure presented to [facility] ED with main concern of sudden onset of left sided chest pain, dull and achy, initially started several weeks ago but worse over the past 24 hours. Pt explains he was working in the yard yesterday, mowed the lawn for several hours when he noticed that the pain was getting worse. He describes this pain as constant and 7/10 in severity, worse with deep breath, non radiating, associated with dyspnea, boating feeling in the stomach area. Pt denies fevers, chills, sick contacts or exposures, no N/V, no urinary concerns, no recent similar events, no recent hospitalization. No focal neurological symptoms such as numbness or tingling, no headaches or visual changes.   In ED, pt is hemodynamically stable with BP 119/96, HR 86. In mild distress due to discomfort   Principal Problem:   Chest pain - etiology unclear - will admit to telemetry unit - CE x 3 to be cycles, repeat 12 lead EKG in AM - check A1C and lipid panel  - CT angio chest pending  - ED doctor discussed case with cardiologist Dr. Collins Scotland --> recommended admission to internal medicine - follow up on cardiology recommendations  Active Problems:   Abdominal distension - likely from ileus as noted on abd XRAY - conservative management - NPO for now, analgesia as needed    Coronary atherosclerosis of native coronary artery - follow up on cardiology recommendations    Diabetes - continue Metformin as per home medical regimen  - check A1C - place  in SSi while inpatient    Dyslipidemia - continue statin as per home medical regimen  - check lipid panel    Systolic and diastolic CHF, acute on chronic - continue home medical regimen with Lasix - daily weights, weight on admission 239 lbs - strict I's and O's  Radiological Exams on Admission:  Dg Chest 2 View  04/10/2014  No active disease.     Ct Abdomen Pelvis W Contrast  04/10/2014   No acute abnormalities in abdomen or pelvis.  Abnormal appearance of the left hemipelvis. 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    EKG: Normal sinus rhythm, no ST/T wave changes  Code Status: Full Family Communication: Pt at bedside Disposition Plan: Admit for further evaluation  [name], MD  [facility] Pager [phone]  Review of Systems:  Constitutional: Negative for fever, chills. Negative for diaphoresis.  HENT: Negative for hearing loss, ear pain, nosebleeds, congestion, sore throat, neck pain, tinnitus and ear discharge.   Eyes: Negative for blurred vision, double vision, photophobia, pain, discharge and redness.  Respiratory: Negative for hemoptysis, sputum production, wheezing and stridor.   Cardiovascular: Negative for claudication and leg swelling.  Gastrointestinal: Negative for heartburn, constipation, blood in stool and melena.  Genitourinary: Negative for dysuria, urgency, frequency, hematuria and flank pain.  Musculoskeletal: Negative for myalgias, back pain, joint pain and falls.  Skin: Negative for itching and rash.  Neurological: Negative  for tingling, tremors, focal weakness, loss of consciousness and headaches.  Endo/Heme/Allergies: Negative for environmental allergies and polydipsia. Does not bruise/bleed easily.  Psychiatric/Behavioral: Negative for suicidal ideas. The patient is not nervous/anxious.      Past Medical History  Diagnosis Date  . Diabetes  mellitus without complication   . Coronary atherosclerosis of native coronary artery   . Obesity   . Hyperlipidemia   . Atrial fibrillation   . CAD (coronary artery disease)     s/p cath, occluded LAD with collateral fillling, 2008, last cath 03/03/2009 with CTO of the LAD multivessell.  . Diabetes    Past Surgical History  Procedure Laterality Date  . Knee surgery    . Tonsillectomy    . Cyst removal, cheek     Social History:  reports that he has never smoked. He does not have any smokeless tobacco history on file. His alcohol and drug histories are not on file.  Allergies  Allergen Reactions  . Codeine   . Morphine And Related Nausea And Vomiting    Family History: no history of cancers, no cardiovascular diseases on mother or father side  Medication Sig  aspirin EC 81 MG tablet Take 1 tablet (81 mg total) by mouth daily.  Cyanocobalamin (VITAMIN B 12 PO) Take by mouth daily.  Docusate Calcium (STOOL SOFTENER PO) Take 100 mg by mouth daily.  ezetimibe-simvastatin 10-40 MG per tablet Take 1 tablet by mouth daily.  furosemide (LASIX) 40 MG tablet Take 40 mg by mouth daily.  metFORMIN (GLUCOPHAGE) 500 MG tablet Take by mouth daily.  traMADol (ULTRAM) 50 MG tablet Take by mouth every 6 (six) hours as needed.  nitroGLYCERIN (NITROSTAT) 0.4 MG SL tablet 0.4 mg every 5 (five) minutes PRN for chest pain.   Physical Exam: Filed Vitals:   04/10/14 1900 04/10/14 1915 04/10/14 2045 04/10/14 2130  BP: 147/99 147/99 139/104 119/96  Pulse: 90 90 93 93  Temp:      TempSrc:      Resp: 21 26 16 20   Height:      Weight:      SpO2: 95% 94% 93% 95%    Physical Exam  Constitutional: Appears well-developed and well-nourished. No distress.  HENT: Normocephalic. External right and left ear normal. Oropharynx is clear and moist.  Eyes: Conjunctivae and EOM are normal. PERRLA, no scleral icterus.  Neck: Normal ROM. Neck supple. No JVD. No tracheal deviation. No thyromegaly.  CVS: RRR,  S1/S2 +, no murmurs, no gallops, no carotid bruit.  Pulmonary: Effort and breath sounds normal, no stridor, diminished breath sounds at bases  Abdominal: Distension noted, tenderness in epigastric area with voluntary guarding.  Musculoskeletal: Normal range of motion. No edema and no tenderness.  Lymphadenopathy: No lymphadenopathy noted, cervical, inguinal. Neuro: Alert. Normal reflexes, muscle tone coordination. No cranial nerve deficit. Skin: Skin is warm and dry. No rash noted. Not diaphoretic. No erythema. No pallor.  Psychiatric: Normal mood and affect. Behavior, judgment, thought content normal.   Labs on Admission:  Basic Metabolic Panel:  Recent Labs Lab 04/10/14 1316  NA 138  K 4.0  CL 100  CO2 27  GLUCOSE 139*  BUN 12  CREATININE 0.70  CALCIUM 9.9   Liver Function Tests:  Recent Labs Lab 04/10/14 1514  AST 30  ALT 27  ALKPHOS 90  BILITOT 1.4*  PROT 7.5  ALBUMIN 4.0    Recent Labs Lab 04/10/14 1514  LIPASE 13   CBC:  Recent Labs Lab 04/10/14 1316  WBC  9.0  HGB 16.9  HCT 47.9  MCV 92.1  PLT 190   Cardiac Enzymes:  Recent Labs Lab 04/10/14 1452  TROPONINI <0.30   If 7PM-7AM, please contact night-coverage [email or web address] Password [email or web address] 04/10/2014, 9:40 PM

## 2014-04-10 NOTE — ED Provider Notes (Signed)
CSN: [id number]     Arrival date & time 04/10/14  1259 History   First MD Initiated Contact with Patient 04/10/14 1441     Chief Complaint  Patient presents with  . Chest Pain     (Consider location/radiation/quality/duration/timing/severity/associated sxs/prior Treatment) The history is provided by the patient. No language interpreter was used.  Bryan Pratt is a 73 year old male with past medical history of diabetes, CAD, atrial fibrillation, obesity with cardiac catheter performed in 2008 with total occlusion of LAD with collateral filling and 2010 CT of LAD multivessel with triple bypass surgery performed on May 2014 with Maze procedure presenting to the ED with chest pain. Patient reports the chest pain has been localized left-sided chest with an underlying dull aching sensation is been ongoing for the past couple of weeks with exacerbation approximately 2 days ago. Patient reported that the pain increased after performing 3 hours of yard work-stated that he mowed the lawn, trimmed the bushes. Described the discomfort as a constant dull aching sensation localized to the left side of his chest without radiation. Stated that he has worsening pain with deep inhalation. Reported associated shortness of breath chest pain. Stated he's been having left arm discomfort described as a dull aching pain-denied radiation. Reported mild left-sided neck discomfort described as a cramping sensation. Patient also reported that he's noticed some bloating to his stomach-feels as if he is bloated, pressure sensation to his abdomen. Stated that his last bowel movement was this morning. Denied fever, chills, cough, dizziness, fainting, numbness, tingling, nausea, vomiting, blurred vision, sudden loss of vision. PCP Dr. Kenton Kingfisher Cardiology Dr. Ellwood Sayers  Past Medical History  Diagnosis Date  . Diabetes mellitus without complication   . Coronary atherosclerosis of native coronary artery   . Obesity   .  Hyperlipidemia   . Atrial fibrillation   . CAD (coronary artery disease)     s/p cath, occluded LAD with collateral fillling, 2008, last cath 03/03/2009 with CTO of the LAD multivessell.  . Diabetes    Past Surgical History  Procedure Laterality Date  . Knee surgery    . Tonsillectomy    . Cyst removal, cheek     Family History  Problem Relation Age of Onset  . CAD Father    History  Substance Use Topics  . Smoking status: Never Smoker   . Smokeless tobacco: Not on file  . Alcohol Use: Not on file    Review of Systems  Constitutional: Positive for diaphoresis. Negative for fever and chills.  Respiratory: Positive for shortness of breath. Negative for chest tightness.   Cardiovascular: Positive for chest pain.  Gastrointestinal: Positive for abdominal pain. Negative for nausea, vomiting, diarrhea, constipation, blood in stool and anal bleeding.  Musculoskeletal: Positive for arthralgias (left arm pain ). Negative for back pain and neck pain.  Neurological: Positive for dizziness. Negative for weakness and numbness.  All other systems reviewed and are negative.     Allergies  Codeine and Morphine and related  Home Medications   Prior to Admission medications   Medication Sig Start Date End Date Taking? Authorizing Provider  Ascorbic Acid (VITAMIN C) 100 MG tablet Take 100 mg by mouth daily.    Historical Provider, MD  aspirin EC 81 MG tablet Take 1 tablet (81 mg total) by mouth daily. 12/19/13   Jettie Booze, MD  beta carotene w/minerals (OCUVITE) tablet Take 1 tablet by mouth daily.    Historical Provider, MD  Cyanocobalamin (VITAMIN B 12 PO) Take by  mouth daily.    Historical Provider, MD  Docusate Calcium (STOOL SOFTENER PO) Take 100 mg by mouth daily.    Historical Provider, MD  ezetimibe-simvastatin (VYTORIN) 10-40 MG per tablet Take 1 tablet by mouth daily.    Historical Provider, MD  furosemide (LASIX) 40 MG tablet 1 tablet daily 02/22/14   Jettie Booze, MD  metFORMIN (GLUCOPHAGE) 500 MG tablet Take by mouth daily.    Historical Provider, MD  Multiple Vitamins-Minerals (MULTIVITAMIN PO) Take by mouth daily.    Historical Provider, MD  nitroGLYCERIN (NITROSTAT) 0.4 MG SL tablet Place 0.4 mg under the tongue every 5 (five) minutes as needed for chest pain.    Historical Provider, MD  traMADol (ULTRAM) 50 MG tablet Take by mouth every 6 (six) hours as needed.    Historical Provider, MD   BP 139/104  Pulse 93  Temp(Src) 98.2 F (36.8 C) (Oral)  Resp 16  Ht 5\' 11"  (1.803 m)  Wt 239 lb (108.41 kg)  BMI 33.35 kg/m2  SpO2 93% Physical Exam  Nursing note and vitals reviewed. Constitutional: He is oriented to person, place, and time. He appears well-developed and well-nourished. No distress.  HENT:  Head: Normocephalic and atraumatic.  Mouth/Throat: Oropharynx is clear and moist. No oropharyngeal exudate.  Eyes: Conjunctivae and EOM are normal. Pupils are equal, round, and reactive to light. Right eye exhibits no discharge. Left eye exhibits no discharge.  Neck: Normal range of motion. Neck supple. No tracheal deviation present.  Cardiovascular: Normal rate, regular rhythm and normal heart sounds.  Exam reveals no friction rub.   No murmur heard. Pulmonary/Chest: Effort normal and breath sounds normal. No respiratory distress. He has no wheezes. He has no rales. He exhibits tenderness.  Abdominal: Soft. Bowel sounds are normal. He exhibits no distension. There is tenderness. There is no rebound and no guarding.  Abdominal distention noted Abdomen heart upon palpation with diffuse tenderness  Musculoskeletal: Normal range of motion.  Full ROM to upper and lower extremities without difficulty noted, negative ataxia noted.  Lymphadenopathy:    He has no cervical adenopathy.  Neurological: He is alert and oriented to person, place, and time. No cranial nerve deficit. He exhibits normal muscle tone. Coordination normal.  Cranial nerves  III-XII grossly intact Strength 5+/5+ to upper and lower extremities bilaterally with resistance applied, equal distribution noted Equal grip strength  Skin: Skin is warm and dry. No rash noted. He is not diaphoretic. No erythema.  Psychiatric: He has a normal mood and affect. His behavior is normal.    ED Course  Procedures (including critical care time)  9:08 PM This provider spoke with Dr. Collins Scotland, Cardiologist - discussed case, history, presentation, labs, imaging in great detail. Recommended patient to be admitted under the care of internal medicine.  9:14 PM This provider spoke with Dr. Marcheta Grammes hospitalist-discussed case, labs, history, presentation and imaging in great detail. Patient to be admitted to telemetry observation.  9:26 PM This provider spoke with Dr. Charlies Silvers who recommended CT angiogram of chest to be performed to rule out PE.   Results for orders placed during the hospital encounter of 04/10/14  CBC      Result Value Ref Range   WBC 9.0  4.0 - 10.5 K/uL   RBC 5.20  4.22 - 5.81 MIL/uL   Hemoglobin 16.9  13.0 - 17.0 g/dL   HCT 47.9  39.0 - 52.0 %   MCV 92.1  78.0 - 100.0 fL   MCH 32.5  26.0 -  34.0 pg   MCHC 35.3  30.0 - 36.0 g/dL   RDW 14.6  11.5 - 15.5 %   Platelets 190  150 - 400 K/uL  BASIC METABOLIC PANEL      Result Value Ref Range   Sodium 138  137 - 147 mEq/L   Potassium 4.0  3.7 - 5.3 mEq/L   Chloride 100  96 - 112 mEq/L   CO2 27  19 - 32 mEq/L   Glucose, Bld 139 (*) 70 - 99 mg/dL   BUN 12  6 - 23 mg/dL   Creatinine, Ser 0.70  0.50 - 1.35 mg/dL   Calcium 9.9  8.4 - 10.5 mg/dL   GFR calc non Af Amer >90  >90 mL/min   GFR calc Af Amer >90  >90 mL/min  TROPONIN I      Result Value Ref Range   Troponin I <0.30  <0.30 ng/mL  HEPATIC FUNCTION PANEL      Result Value Ref Range   Total Protein 7.5  6.0 - 8.3 g/dL   Albumin 4.0  3.5 - 5.2 g/dL   AST 30  0 - 37 U/L   ALT 27  0 - 53 U/L   Alkaline Phosphatase 90  39 - 117 U/L   Total Bilirubin 1.4  (*) 0.3 - 1.2 mg/dL   Bilirubin, Direct 0.3  0.0 - 0.3 mg/dL   Indirect Bilirubin 1.1 (*) 0.3 - 0.9 mg/dL  LIPASE, BLOOD      Result Value Ref Range   Lipase 13  11 - 59 U/L  URINALYSIS, ROUTINE W REFLEX MICROSCOPIC      Result Value Ref Range   Color, Urine YELLOW  YELLOW   APPearance CLEAR  CLEAR   Specific Gravity, Urine 1.012  1.005 - 1.030   pH 6.5  5.0 - 8.0   Glucose, UA NEGATIVE  NEGATIVE mg/dL   Hgb urine dipstick NEGATIVE  NEGATIVE   Bilirubin Urine NEGATIVE  NEGATIVE   Ketones, ur 15 (*) NEGATIVE mg/dL   Protein, ur NEGATIVE  NEGATIVE mg/dL   Urobilinogen, UA 1.0  0.0 - 1.0 mg/dL   Nitrite NEGATIVE  NEGATIVE   Leukocytes, UA NEGATIVE  NEGATIVE  PRO B NATRIURETIC PEPTIDE      Result Value Ref Range   Pro B Natriuretic peptide (BNP) 345.8 (*) 0 - 125 pg/mL    Labs Review Labs Reviewed  BASIC METABOLIC PANEL - Abnormal; Notable for the following:    Glucose, Bld 139 (*)    All other components within normal limits  HEPATIC FUNCTION PANEL - Abnormal; Notable for the following:    Total Bilirubin 1.4 (*)    Indirect Bilirubin 1.1 (*)    All other components within normal limits  URINALYSIS, ROUTINE W REFLEX MICROSCOPIC - Abnormal; Notable for the following:    Ketones, ur 15 (*)    All other components within normal limits  PRO B NATRIURETIC PEPTIDE - Abnormal; Notable for the following:    Pro B Natriuretic peptide (BNP) 345.8 (*)    All other components within normal limits  CBC  TROPONIN I  LIPASE, BLOOD  I-STAT TROPOININ, ED    Imaging Review Dg Chest 2 View  04/10/2014   CLINICAL DATA:  Chest pain and shortness of breath.  EXAM: CHEST - 2 VIEW  COMPARISON:  06/29/2013  FINDINGS: The heart size and mediastinal contours are within normal limits. Stable mild chronic elevation of the right hemidiaphragm. There is no evidence of pulmonary edema, consolidation,  pneumothorax, nodule or pleural fluid. The visualized skeletal structures are unremarkable.  IMPRESSION:  No active disease.   Electronically Signed   By: Aletta Edouard M.D.   On: 04/10/2014 14:08   Ct Abdomen Pelvis W Contrast  04/10/2014   CLINICAL DATA:  Abdominal pain  EXAM: CT ABDOMEN AND PELVIS WITH CONTRAST  TECHNIQUE: Multidetector CT imaging of the abdomen and pelvis was performed using the standard protocol following bolus administration of intravenous contrast.  CONTRAST:  174mL OMNIPAQUE IOHEXOL 300 MG/ML  SOLN  COMPARISON:  None.  FINDINGS: Visualized portions of the lung bases are clear. There is extensive coronary arterial calcification.  Liver, gallbladder, spleen, pancreas, kidneys, and adrenal glands are normal. There is calcification of the aorta with no significant aortic dilatation.  There is a nonobstructive bowel gas pattern. Bowel appears normal. Appendix is not identified.  There is no ascites or significant adenopathy.  Bladder is normal.  Prostate is enlarged to 62 x 64 mm. There are no acute musculoskeletal findings. However, there is trabecular thickening involving the left hemipelvis with expansion of the superior pubic ramus on the left. No evidence of soft tissue mass or periosteal reaction.  IMPRESSION: 1.  No acute abnormalities in the abdomen or pelvis.  2. Abnormal appearance of the left hemipelvis. 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   On: 04/10/2014 20:32   Dg Abd 2 Views  04/10/2014   CLINICAL DATA:  Abdominal pain.  EXAM: ABDOMEN - 2 VIEW  COMPARISON:  None.  FINDINGS: There is moderate air and stool throughout the colon and air filled but nondistended small bowel loops throughout the abdomen. The soft tissue shadows are maintained. No free air. The bony structures are unremarkable.  IMPRESSION: Air throughout the small bowel and colon suggesting a diffuse ileus.   Electronically Signed   By: Kalman Jewels M.D.   On: 04/10/2014 17:35     EKG Interpretation   Date/Time:  Wednesday Apr 10 2014 13:04:24  EDT Ventricular Rate:  108 PR Interval:  196 QRS Duration: 84 QT Interval:  336 QTC Calculation: 450 R Axis:   -47 Text Interpretation:  Sinus tachycardia Left anterior fascicular block  Inferior infarct , age undetermined Anteroseptal infarct , age  undetermined Abnormal ECG tachycardia, otherwise no significant change  from prior EKG Confirmed by [facility]  MD, CHRISTOPHER [phone]) on 04/10/2014  2:53:03 PM      MDM   Final diagnoses:  Chest pain  CAD (coronary artery disease)   Medications  sodium chloride 0.9 % bolus 500 mL (0 mLs Intravenous Stopped 04/10/14 1557)  aspirin chewable tablet 243 mg (243 mg Oral Given 04/10/14 1557)  iohexol (OMNIPAQUE) 300 MG/ML solution 25 mL (25 mLs Oral Contrast Given 04/10/14 1836)  iohexol (OMNIPAQUE) 300 MG/ML solution 100 mL (100 mLs Intravenous Contrast Given 04/10/14 2001)   Filed Vitals:   04/10/14 1830 04/10/14 1900 04/10/14 1915 04/10/14 2045  BP: 145/80 147/99 147/99 139/104  Pulse: 93 90 90 93  Temp:      TempSrc:      Resp: 20 21 26 16   Height:      Weight:      SpO2: 94% 95% 94% 93%   EKG noted sinus tachycardia with a heart rate of 108 beats per minute with a left anterior fascicular block-no significant change since prior EKG. Troponin negative elevation. BNP elevated at 345.8. CBC negative elevated white blood cell count noted.  BMP negative findings - kidneys functioning well. Hepatic function panel noted mildly elevated bilirubin of 1.4 and indirect bilirubin of 1.1. Lipase negative elevation. Urinalysis negative for nitrites, leukocytosis. Negative blood in urine. Chest x-ray negative for acute cardiopulmonary disease-no evidence of pulmonary edema, consolidation, pneumothorax, nodule pleural effusion/fluid. Plain film of abdomen noted air throughout the small bowel and colon that suggests a diffuse ileus. CT abdomen and pelvis with contrast to negative acute abnormalities in abdomen or pelvis, abnormal appearance of the left  hemipelvis-this may be due to Paget's disease of the bone. CT abdomen and pelvis negative findings for SBO, ileus. Based on case and history with cath in 2008 and 2010 with triple bypass in May 2014 patient to be admitted regarding chest pain-patient has high-risk factors and strong cardiac history. Discussed case with cardiologist-Dr. Herman-cardiology to consult and recommended admission to internal medicine. CT angiogram of chest scheduled for morning - as per instructed by Dr. Charlies Silvers, CT made aware. Patient admitted to internal medicine under the care of Dr. Charlies Silvers -recommended admission to telemetry floor.  Jamse Mead, PA-C 04/10/14 2257

## 2014-04-10 NOTE — Telephone Encounter (Signed)
Addendum to previous note:  Patient reports he is concerned of vessel collapse following bypass surgery 5/14; states he has a brother who experienced this

## 2014-04-10 NOTE — ED Notes (Addendum)
Attempted report 

## 2014-04-11 ENCOUNTER — Encounter (HOSPITAL_COMMUNITY): Payer: Self-pay

## 2014-04-11 ENCOUNTER — Observation Stay (HOSPITAL_COMMUNITY): Payer: Medicare Other

## 2014-04-11 DIAGNOSIS — I2510 Atherosclerotic heart disease of native coronary artery without angina pectoris: Secondary | ICD-10-CM | POA: Diagnosis not present

## 2014-04-11 DIAGNOSIS — R079 Chest pain, unspecified: Secondary | ICD-10-CM | POA: Diagnosis not present

## 2014-04-11 DIAGNOSIS — I509 Heart failure, unspecified: Secondary | ICD-10-CM

## 2014-04-11 DIAGNOSIS — E119 Type 2 diabetes mellitus without complications: Secondary | ICD-10-CM | POA: Diagnosis not present

## 2014-04-11 DIAGNOSIS — I5043 Acute on chronic combined systolic (congestive) and diastolic (congestive) heart failure: Secondary | ICD-10-CM

## 2014-04-11 DIAGNOSIS — E785 Hyperlipidemia, unspecified: Secondary | ICD-10-CM | POA: Diagnosis not present

## 2014-04-11 LAB — TROPONIN I: Troponin I: <0.3 ng/mL (ref <0.30)

## 2014-04-11 LAB — GLUCOSE, CAPILLARY
GLUCOSE-CAPILLARY: 154 mg/dL — AB (ref 70–99)
Glucose-Capillary: 113 mg/dL — ABNORMAL HIGH (ref 70–99)
Glucose-Capillary: 177 mg/dL — ABNORMAL HIGH (ref 70–99)

## 2014-04-11 LAB — HEMOGLOBIN A1C
HEMOGLOBIN A1C: 6.4 % — AB (ref <5.7)
MEAN PLASMA GLUCOSE: 137 mg/dL — AB (ref <117)

## 2014-04-11 MED ORDER — PANTOPRAZOLE SODIUM 40 MG PO TBEC
40.0000 mg | DELAYED_RELEASE_TABLET | Freq: Every day | ORAL | Status: DC
  Administered 2014-04-11: 40 mg via ORAL
  Filled 2014-04-11: qty 1

## 2014-04-11 MED ORDER — ENOXAPARIN SODIUM 40 MG/0.4ML ~~LOC~~ SOLN
40.0000 mg | SUBCUTANEOUS | Status: DC
  Filled 2014-04-11: qty 0.4

## 2014-04-11 MED ORDER — IOHEXOL 350 MG/ML SOLN
100.0000 mL | Freq: Once | INTRAVENOUS | Status: AC | PRN
  Administered 2014-04-11: 100 mL via INTRAVENOUS

## 2014-04-11 MED ORDER — PANTOPRAZOLE SODIUM 40 MG PO TBEC: 40.0000 mg | DELAYED_RELEASE_TABLET | Freq: Every day | ORAL | Status: DC

## 2014-04-11 MED ORDER — GI COCKTAIL ~~LOC~~
30.0000 mL | Freq: Two times a day (BID) | ORAL | Status: DC | PRN
  Filled 2014-04-11: qty 30

## 2014-04-11 NOTE — Progress Notes (Signed)
RN informed by CCMD that pt. Having frequent PVCs with change of rhythm during the night. On call NP, Tylene Fantasia made aware. New orders received. RN will implement and continue to monitor pt. For changes in condition. Bryan Pratt

## 2014-04-11 NOTE — ED Provider Notes (Signed)
Medical screening examination/treatment/procedure(s) were conducted as a shared visit with non-physician practitioner(s) and myself.  I personally evaluated the patient during the encounter.   EKG Interpretation   Date/Time:  Wednesday Apr 10 2014 13:04:24 EDT Ventricular Rate:  108 PR Interval:  196 QRS Duration: 84 QT Interval:  336 QTC Calculation: 450 R Axis:   -47 Text Interpretation:  Sinus tachycardia Left anterior fascicular block  Inferior infarct , age undetermined Anteroseptal infarct , age  undetermined Abnormal ECG tachycardia, otherwise no significant change  from prior EKG Confirmed by [facility]  MD, Queen Abbett [phone]) on 04/10/2014  2:53:03 PM      Patient presents to the ER for evaluation of multiple problems. Patient has been experiencing intermittent left-sided chest pain. He does have a history of coronary artery disease, including bypass. Pain in her chest radiates to the left arm, is concern for possible cardiac etiology. Initial workup was negative. Will require further evaluation by cardiology.  Patient did complain of abdominal distention and "gas". Plain films suggest ileus, CAT scan was unremarkable, however.  Orpah Greek, MD 04/11/14 [phone]

## 2014-04-11 NOTE — Discharge Summary (Signed)
Physician Discharge Summary  Bryan Pratt ION:[id number] DOB: May 05, 1941 DOA: 04/10/2014  PCP: [name], MD  Admit date: 04/10/2014 Discharge date: 04/11/2014  Time spent: 35 minutes  Recommendations for Outpatient Follow-up:  Anti-inflammatory therapy. No further in-hospital cardiac evaluation seems necessary. Followup with his primary cardiologist within the next week or 2 following discharge. Abnormal appearance of the left hemipelvis. 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  -follow up PCP -hold metformin for 48 hours  Discharge Diagnoses:  Principal Problem:   Chest pain Active Problems:   Coronary atherosclerosis of native coronary artery   Diabetes   Dyslipidemia   Systolic and diastolic CHF, acute on chronic   Discharge Condition: improved  Diet recommendation: cardiac/diabetic  Filed Weights   04/10/14 1315 04/10/14 2320  Weight: 108.41 kg (239 lb) 107.14 kg (236 lb 3.2 oz)    History of present illness:  -year-old male with past medical history of diabetes, CAD, atrial fibrillation, obesity with cardiac catheter performed in 2008 with total occlusion of LAD with collateral filling and 2010 CT of LAD multivessel with triple bypass surgery performed on May 2014, with Maze procedure presented to [facility] ED with main concern of sudden onset of left sided chest pain, dull and achy, initially started several weeks ago but worse over the past 24 hours. Pt explains he was working in the yard yesterday, mowed the lawn for several hours when he noticed that the pain was getting worse. He describes this pain as constant and 7/10 in severity, worse with deep breath, non radiating, associated with dyspnea, boating feeling in the stomach area. Pt denies fevers, chills, sick contacts or exposures, no N/V, no urinary concerns, no recent similar events, no recent hospitalization. No focal neurological symptoms such as numbness or tingling, no headaches or visual changes.  In ED, pt is  hemodynamically stable with BP 119/96, HR 86. In mild distress due to discomfort    Hospital Course:  Chest pain  - etiology unclear  -telemetry ok - CE x 3 neg  - A1C ok and lipid panel ok  - CT angio chest - no PE - ED doctor discussed case with cardiologist Dr. Collins Scotland --> recommended admission to internal medicine  - follow up on cardiology recommendations for antiinflammatory  Abdominal distension  - +BS, +BM  -obese abd   Coronary atherosclerosis of native coronary artery  - outpatient follow up  Diabetes  NO METFORMIN FOR 41 HOURS  - place in SS[facility] while inpatient   Dyslipidemia  - continue statin as per home medical regimen   Systolic and diastolic CHF, acute on chronic  - continue home medical regimen with Lasix  - daily weights, weight on admission 239 lbs  - strict I's and O's     Procedures:  none  Consultations:  cards  Discharge Exam: Filed Vitals:   04/11/14 0648  BP: 120/66  Pulse: 100  Temp: 99.2 F (37.3 C)  Resp: 18    General: A+Ox3, NAD Cardiovascular: rrr Respiratory: clear  Discharge Instructions You were cared for by a hospitalist during your hospital stay. If you have any questions about your discharge medications or the care you received while you were in the hospital after you are discharged, you can call the unit and asked to speak with the hospitalist on call if the hospitalist that took care of you is not available. Once you are discharged, your primary care physician will handle any further medical issues. Please note that NO REFILLS for any  discharge medications will be authorized once you are discharged, as it is imperative that you return to your primary care physician (or establish a relationship with a primary care physician if you do not have one) for your aftercare needs so that they can reassess your need for medications and monitor your lab values.  Discharge Orders   Future Appointments Provider Department Dept Phone    05/10/2014 1:15 PM Jettie Booze, MD [place] [phone]   Future Orders Complete By Expires   Diet - low sodium heart healthy  As directed    Diet Carb Modified  As directed    Discharge instructions  As directed    Increase activity slowly  As directed        Medication List         aspirin EC 81 MG tablet  Take 1 tablet (81 mg total) by mouth daily.     ezetimibe-simvastatin 10-40 MG per tablet  Commonly known as:  VYTORIN  Take 1 tablet by mouth daily.     furosemide 40 MG tablet  Commonly known as:  LASIX  Take 40 mg by mouth daily.     metFORMIN 500 MG tablet  Commonly known as:  GLUCOPHAGE  Take by mouth daily.     MULTIVITAMIN PO  Take by mouth daily.     beta carotene w/minerals tablet  Take 1 tablet by mouth daily.     nitroGLYCERIN 0.4 MG SL tablet  Commonly known as:  NITROSTAT  Place 0.4 mg under the tongue every 5 (five) minutes as needed for chest pain.     pantoprazole 40 MG tablet  Commonly known as:  PROTONIX  Take 1 tablet (40 mg total) by mouth daily.     STOOL SOFTENER PO  Take 100 mg by mouth daily.     traMADol 50 MG tablet  Commonly known as:  ULTRAM  Take by mouth every 6 (six) hours as needed.     VITAMIN B 12 PO  Take by mouth daily.     vitamin C 100 MG tablet  Take 100 mg by mouth daily.       Allergies  Allergen Reactions  . Codeine   . Morphine And Related Nausea And Vomiting      The results of significant diagnostics from this hospitalization (including imaging, microbiology, ancillary and laboratory) are listed below for reference.    Significant Diagnostic Studies: Dg Chest 2 View  04/10/2014   CLINICAL DATA:  Chest pain and shortness of breath.  EXAM: CHEST - 2 VIEW  COMPARISON:  06/29/2013  FINDINGS: The heart size and mediastinal contours are within normal limits. Stable mild chronic elevation of the right hemidiaphragm. There is no evidence of pulmonary edema, consolidation,  pneumothorax, nodule or pleural fluid. The visualized skeletal structures are unremarkable.  IMPRESSION: No active disease.   Electronically Signed   By: Aletta Edouard M.D.   On: 04/10/2014 14:08   Ct Angio Chest Pe W/cm &/or Wo Cm  04/11/2014   CLINICAL DATA:  Chest pain.  Question pulmonary embolus.  EXAM: CT ANGIOGRAPHY CHEST WITH CONTRAST  TECHNIQUE: Multidetector CT imaging of the chest was performed using the standard protocol during bolus administration of intravenous contrast. Multiplanar CT image reconstructions and MIPs were obtained to evaluate the vascular anatomy.  CONTRAST:  100 mL OMNIPAQUE IOHEXOL 350 MG/ML SOLN  COMPARISON:  Plain films of the chest 04/10/2014.  FINDINGS: No pulmonary embolus is identified. There is asymmetric elevation of  the right hemidiaphragm relative to the left. Calcific aortic and coronary atherosclerosis is present. The patient is status post CABG. There is cardiomegaly. No pleural or pericardial effusion. No pathologically enlarged lymph nodes are seen in the axilla, hila or mediastinum. The lungs demonstrate mild basilar atelectasis. No consolidative process, nodule or mass identified. Visualized upper abdomen is unremarkable. No focal bony abnormality is identified.  Review of the MIP images confirms the above findings.  IMPRESSION: Negative for pulmonary embolus or acute disease.  Calcific aortic and coronary atherosclerosis. The patient is status post CABG.  Cardiomegaly.   Electronically Signed   By: Drusilla Kanner M.D.   On: 04/11/2014 09:23   Ct Abdomen Pelvis W Contrast  04/10/2014   CLINICAL DATA:  Abdominal pain  EXAM: CT ABDOMEN AND PELVIS WITH CONTRAST  TECHNIQUE: Multidetector CT imaging of the abdomen and pelvis was performed using the standard protocol following bolus administration of intravenous contrast.  CONTRAST:  OMNIPAQUE IOHEXOL 300 MG/ML  SOLN  COMPARISON:  None.  FINDINGS: Visualized portions of the lung bases are clear. There is  extensive coronary arterial calcification.  Liver, gallbladder, spleen, pancreas, kidneys, and adrenal glands are normal. There is calcification of the aorta with no significant aortic dilatation.  There is a nonobstructive bowel gas pattern. Bowel appears normal. Appendix is not identified.  There is no ascites or significant adenopathy.  Bladder is normal.  Prostate is enlarged to 62 x 64 mm. There are no acute musculoskeletal findings. However, there is trabecular thickening involving the left hemipelvis with expansion of the superior pubic ramus on the left. No evidence of soft tissue mass or periosteal reaction.  IMPRESSION: 1.  No acute abnormalities in the abdomen or pelvis.  2. Abnormal appearance of the left hemipelvis. 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   On: 04/10/2014 20:32   Dg Abd 2 Views  04/10/2014   CLINICAL DATA:  Abdominal pain.  EXAM: ABDOMEN - 2 VIEW  COMPARISON:  None.  FINDINGS: There is moderate air and stool throughout the colon and air filled but nondistended small bowel loops throughout the abdomen. The soft tissue shadows are maintained. No free air. The bony structures are unremarkable.  IMPRESSION: Air throughout the small bowel and colon suggesting a diffuse ileus.   Electronically Signed   By: Loralie Champagne M.D.   On: 04/10/2014 17:35    Microbiology: No results found for this or any previous visit (from the past 240 hour(s)).   Labs: Basic Metabolic Panel:  Recent Labs Lab 04/10/14 1316  NA 138  K 4.0  CL 100  CO2 27  GLUCOSE 139*  BUN 12  CREATININE 0.70  CALCIUM 9.9   Liver Function Tests:  Recent Labs Lab 04/10/14 1514  AST 30  ALT 27  ALKPHOS 90  BILITOT 1.4*  PROT 7.5  ALBUMIN 4.0    Recent Labs Lab 04/10/14 1514  LIPASE 13   No results found for this basename: AMMONIA,  in the last 168 hours CBC:  Recent Labs Lab 04/10/14 1316  WBC 9.0  HGB 16.9  HCT 47.9  MCV 92.1   PLT 190   Cardiac Enzymes:  Recent Labs Lab 04/10/14 1452 04/11/14 0520  TROPONINI <0.30 <0.30   BNP: BNP (last 3 results)  Recent Labs  04/10/14 1515  PROBNP 345.8*   CBG:  Recent Labs Lab 04/10/14 2251 04/10/14 2336 04/11/14 0624 04/11/14 1141  GLUCAP 191*  154* 113* 177*       Signed:  Geradine Girt  [facility] 04/11/2014, 12:33 PM

## 2014-04-11 NOTE — Progress Notes (Signed)
Pt. Arrived to unit from ED via stretcher in alert and stable condition.  No s/s of distress or discomfort noted. Pt. Denies pain at this time. Pt. Oriented to room and call light placed within reach. Sons at bedside. RN will continue to monitor pt. For changes in condition. Gillis Santa Daeron Carreno

## 2014-04-11 NOTE — Progress Notes (Signed)
       Patient Name: Bryan Pratt Date of Encounter: 04/11/2014    SUBJECTIVE: The patient had coronary bypass surgery in May of 2014 at [facility]. After significant outdoor work approximately a week ago he has had chest discomfort that is positional and also worsened by deep breaths. He denies chills and fever. His been nearly continuous. CT of the chest did not reveal evidence of PE. No mention of sternal dehiscence. Cardiac markers were negative. EKGs were nonischemic.  TELEMETRY: Normal sinus rhythm Filed Vitals:   04/10/14 2221 04/10/14 2246 04/10/14 2320 04/11/14 0648  BP: 152/96 119/86 129/79 120/66  Pulse: 101 97 102 100  Temp:  98.5 F (36.9 C) 98.9 F (37.2 C) 99.2 F (37.3 C)  TempSrc:  Oral  Oral  Resp: 21 18  18   Height:   5\' 11"  (1.803 m)   Weight:   236 lb 3.2 oz (107.14 kg)   SpO2: 94% 91% 94% 94%    Intake/Output Summary (Last 24 hours) at 04/11/14 1213 Last data filed at 04/11/14 0900  Gross per 24 hour  Intake    360 ml  Output      0 ml  Net    360 ml   LABS: Basic Metabolic Panel:  Recent Labs  04/10/14 1316  NA 138  K 4.0  CL 100  CO2 27  GLUCOSE 139*  BUN 12  CREATININE 0.70  CALCIUM 9.9   CBC:  Recent Labs  04/10/14 1316  WBC 9.0  HGB 16.9  HCT 47.9  MCV 92.1  PLT 190   Cardiac Enzymes:  Recent Labs  04/10/14 1452 04/11/14 0520  TROPONINI <0.30 <0.30   BNP: No components found with this basename: POCBNP,  Hemoglobin A1C:  Recent Labs  04/10/14 2156  HGBA1C 6.4*   Fasting Lipid Panel:  Recent Labs  04/10/14 2156  CHOL 118  HDL 65  LDLCALC 40  TRIG 64  CHOLHDL 1.8    Radiology/Studies:  No acute cardiac or pulmonary abnormalities are noted by chest CT. Evidence of prior coronary artery bypass grafting as noted  Physical Exam: Blood pressure 120/66, pulse 100, temperature 99.2 F (37.3 C), temperature source Oral, resp. rate 18, height 5\' 11"  (1.803 m), weight 236 lb 3.2 oz (107.14  kg), SpO2 94.00%. Weight change:   Wt Readings from Last 3 Encounters:  04/10/14 236 lb 3.2 oz (107.14 kg)  11/12/13 237 lb 12.8 oz (107.865 kg)  03/07/13 235 lb (106.595 kg)    No paracardial or pleural friction rub is heard. Lungs are clear auscultation and percussion No edema Mild palpable sternal soreness  ASSESSMENT:  1. I do not believe this represents acute coronary syndrome/ischemic chest discomfort. The most likely explanation is musculoskeletal etiology. An alternative possibility is late presenting post cardiotomy syndrome.  Plan:  Anti-inflammatory therapy. No further in-hospital cardiac evaluation seems necessary. Followup with his primary cardiologist within the next week or 2 following discharge.  Signed, Belva Crome III 04/11/2014, 12:13 PM

## 2014-04-11 NOTE — Progress Notes (Signed)
PROGRESS NOTE  Bryan Pratt EQA:[id number] DOB: 02-13-1941 DOA: 04/10/2014 PCP: [name], MD  Assessment/Plan: Chest pain  - etiology unclear  -telemetry  - CE x 3 -  A1C ok and lipid panel ok - CT angio chest pending  - ED doctor discussed case with cardiologist Dr. Collins Scotland --> recommended admission to internal medicine  - follow up on cardiology recommendations - ? Outpatient stress test  Abdominal distension  - +BS, +BM -obese abd  Coronary atherosclerosis of native coronary artery  - follow up on cardiology recommendations   Diabetes  NO METFORMIN FOR 60 HOURS - place in [facility] while inpatient   Dyslipidemia  - continue statin as per home medical regimen     Systolic and diastolic CHF, acute on chronic  - continue home medical regimen with Lasix  - daily weights, weight on admission 239 lbs  - strict I's and O's  Abnormal appearance of the left hemipelvis. 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 -follow up PCP  Code Status: full Family Communication: patient Disposition Plan: home soon   Consultants:  cards  Procedures:     HPI/Subjective: Patient hungry No CP, no SOB -wants to get home to disabled wife  Objective: Filed Vitals:   04/11/14 0648  BP: 120/66  Pulse: 100  Temp: 99.2 F (37.3 C)  Resp: 18   No intake or output data in the 24 hours ending 04/11/14 0839 Filed Weights   04/10/14 1315 04/10/14 2320  Weight: 108.41 kg (239 lb) 107.14 kg (236 lb 3.2 oz)    Exam:   General:  A+Ox3, NAD  Cardiovascular: rrr  Respiratory: clear anterior  Abdomen: +BS, soft- obese  Musculoskeletal: moves all 4 ext   Data Reviewed: Basic Metabolic Panel:  Recent Labs Lab 04/10/14 1316  NA 138  K 4.0  CL 100  CO2 27  GLUCOSE 139*  BUN 12  CREATININE 0.70  CALCIUM 9.9   Liver Function Tests:  Recent Labs Lab 04/10/14 1514  AST 30  ALT 27  ALKPHOS 90  BILITOT 1.4*  PROT 7.5  ALBUMIN 4.0    Recent Labs Lab  04/10/14 1514  LIPASE 13   No results found for this basename: AMMONIA,  in the last 168 hours CBC:  Recent Labs Lab 04/10/14 1316  WBC 9.0  HGB 16.9  HCT 47.9  MCV 92.1  PLT 190   Cardiac Enzymes:  Recent Labs Lab 04/10/14 1452 04/11/14 0520  TROPONINI <0.30 <0.30   BNP (last 3 results)  Recent Labs  04/10/14 1515  PROBNP 345.8*   CBG:  Recent Labs Lab 04/10/14 2251 04/10/14 2336 04/11/14 0624  GLUCAP 191* 154* 113*    No results found for this or any previous visit (from the past 240 hour(s)).   Studies: Dg Chest 2 View  04/10/2014   CLINICAL DATA:  Chest pain and shortness of breath.  EXAM: CHEST - 2 VIEW  COMPARISON:  06/29/2013  FINDINGS: The heart size and mediastinal contours are within normal limits. Stable mild chronic elevation of the right hemidiaphragm. There is no evidence of pulmonary edema, consolidation, pneumothorax, nodule or pleural fluid. The visualized skeletal structures are unremarkable.  IMPRESSION: No active disease.   Electronically Signed   By: Aletta Edouard M.D.   On: 04/10/2014 14:08   Ct Abdomen Pelvis W Contrast  04/10/2014   CLINICAL DATA:  Abdominal pain  EXAM: CT ABDOMEN AND PELVIS WITH CONTRAST  TECHNIQUE: Multidetector CT imaging of the abdomen and pelvis  was performed using the standard protocol following bolus administration of intravenous contrast.  CONTRAST:  1106mL OMNIPAQUE IOHEXOL 300 MG/ML  SOLN  COMPARISON:  None.  FINDINGS: Visualized portions of the lung bases are clear. There is extensive coronary arterial calcification.  Liver, gallbladder, spleen, pancreas, kidneys, and adrenal glands are normal. There is calcification of the aorta with no significant aortic dilatation.  There is a nonobstructive bowel gas pattern. Bowel appears normal. Appendix is not identified.  There is no ascites or significant adenopathy.  Bladder is normal.  Prostate is enlarged to 62 x 64 mm. There are no acute musculoskeletal findings. However,  there is trabecular thickening involving the left hemipelvis with expansion of the superior pubic ramus on the left. No evidence of soft tissue mass or periosteal reaction.  IMPRESSION: 1.  No acute abnormalities in the abdomen or pelvis.  2. Abnormal appearance of the left hemipelvis. 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   On: 04/10/2014 20:32   Dg Abd 2 Views  04/10/2014   CLINICAL DATA:  Abdominal pain.  EXAM: ABDOMEN - 2 VIEW  COMPARISON:  None.  FINDINGS: There is moderate air and stool throughout the colon and air filled but nondistended small bowel loops throughout the abdomen. The soft tissue shadows are maintained. No free air. The bony structures are unremarkable.  IMPRESSION: Air throughout the small bowel and colon suggesting a diffuse ileus.   Electronically Signed   By: Kalman Jewels M.D.   On: 04/10/2014 17:35    Scheduled Meds: . aspirin EC  81 mg Oral Daily  . beta carotene w/minerals  1 tablet Oral Daily  . docusate sodium  100 mg Oral Daily  . enoxaparin (LOVENOX) injection  30 mg Subcutaneous Q24H  . ezetimibe-simvastatin  1 tablet Oral Daily  . furosemide  40 mg Oral Daily  . insulin aspart  0-9 Units Subcutaneous TID WC  . vitamin C  250 mg Oral Daily   Continuous Infusions:  Antibiotics Given (last 72 hours)   None      Principal Problem:   Chest pain Active Problems:   Coronary atherosclerosis of native coronary artery   Diabetes   Dyslipidemia   Systolic and diastolic CHF, acute on chronic    Time spent: 35 min    [facility] Pager [phone]. If 7PM-7AM, please contact night-coverage at [email or web address], password [email or web address] 04/11/2014, 8:39 AM  LOS: 1 day

## 2014-04-11 NOTE — Progress Notes (Signed)
Assessment unchanged. Discussed D/C instructions with pt including f/u appointments and new medications. Verbalized understanding. RX given to pt. IV and tele removed. Pt left with belongings accompanied by Volunteer. 

## 2014-05-01 ENCOUNTER — Telehealth: Payer: Self-pay | Admitting: Interventional Cardiology

## 2014-05-01 ENCOUNTER — Encounter: Payer: Self-pay | Admitting: *Deleted

## 2014-05-01 NOTE — Telephone Encounter (Signed)
New message    Patient at the dentist office now .    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     Office is asking for note to be fax over.

## 2014-05-01 NOTE — Telephone Encounter (Signed)
Letter generated and placed in medical records for faxing to the number provided.

## 2014-05-10 ENCOUNTER — Ambulatory Visit: Payer: Medicare Other | Admitting: Interventional Cardiology

## 2014-07-04 ENCOUNTER — Encounter: Payer: Self-pay | Admitting: Interventional Cardiology

## 2014-07-04 ENCOUNTER — Other Ambulatory Visit: Payer: Self-pay | Admitting: *Deleted

## 2014-07-04 MED ORDER — FUROSEMIDE 40 MG PO TABS: 40.0000 mg | ORAL_TABLET | Freq: Every day | ORAL | Status: DC

## 2014-07-30 ENCOUNTER — Encounter: Payer: Self-pay | Admitting: Interventional Cardiology

## 2014-07-30 ENCOUNTER — Ambulatory Visit (INDEPENDENT_AMBULATORY_CARE_PROVIDER_SITE_OTHER): Payer: Medicare Other | Admitting: Interventional Cardiology

## 2014-07-30 VITALS — BP 150/88 | HR 98 | Ht 70.0 in | Wt 239.0 lb

## 2014-07-30 DIAGNOSIS — I10 Essential (primary) hypertension: Secondary | ICD-10-CM | POA: Insufficient documentation

## 2014-07-30 DIAGNOSIS — I2510 Atherosclerotic heart disease of native coronary artery without angina pectoris: Secondary | ICD-10-CM

## 2014-07-30 DIAGNOSIS — E669 Obesity, unspecified: Secondary | ICD-10-CM

## 2014-07-30 DIAGNOSIS — E785 Hyperlipidemia, unspecified: Secondary | ICD-10-CM

## 2014-07-30 NOTE — Patient Instructions (Signed)
Your physician recommends that you continue on your current medications as directed. Please refer to the Current Medication list given to you today.  Your physician wants you to follow-up in: 1 year with Dr. Varanasi. You will receive a reminder letter in the mail two months in advance. If you don't receive a letter, please call our office to schedule the follow-up appointment.  

## 2014-07-30 NOTE — Progress Notes (Signed)
Patient ID: Bryan Pratt, male   DOB: Oct 15, 1941, 73 y.o.   MRN: [id number] Patient stopped .  WiIl have to make sure that he does not have any problems with tachycardia.   Manar Smalling S.

## 2014-07-30 NOTE — Progress Notes (Signed)
Patient ID: Bryan Pratt, male   DOB: Sep 28, 1941, 73 y.o.   MRN: [id number]    [place], [place], [place]  [address] Phone: [phone] Fax:  [phone]  Date:  07/30/2014   ID:  Bryan Pratt, DOB 01/15/1941, MRN [id number]  PCP:  [name], MD      History of Present Illness: Bryan Pratt is a 73 y.o. male who has CAD. He has CABG/Maze at [facility] on 04/27/13. He has a stressful life due to his wife's Alzheimers. He has muscle and joint pains. He is looking into a facility to place her. He is not exercising much. His wife fell and he reached to catch her. He thought he pulled a muscle. CXR was ok. He had swelling which was resolved with Lasix. He is out working in the yard. He still has some minor leg swelling.   CAD/ASCVD:  c/o Dyspnea on exertion no change.  Denies : Chest pain.  Dizziness.  Leg edema.  Nitroglycerin.  Orthopnea.  Palpitations.  Syncope.   He has some sensory changes in his feet and trigger finger sx in his hands.  Exercise ahs been limited.    Wt Readings from Last 3 Encounters:  07/30/14 239 lb (108.41 kg)  04/10/14 236 lb 3.2 oz (107.14 kg)  11/12/13 237 lb 12.8 oz (107.865 kg)     Past Medical History  Diagnosis Date  . Diabetes mellitus without complication   . Coronary atherosclerosis of native coronary artery   . Obesity   . Hyperlipidemia   . Atrial fibrillation   . CAD (coronary artery disease)     s/p cath, occluded LAD with collateral fillling, 2008, last cath 03/03/2009 with CTO of the LAD multivessell.  . Diabetes     Current Outpatient Prescriptions  Medication Sig Dispense Refill  . Ascorbic Acid (VITAMIN C) 100 MG tablet Take 100 mg by mouth daily.      Marland Kitchen aspirin EC 81 MG tablet Take 1 tablet (81 mg total) by mouth daily.  90 tablet  3  . beta carotene w/minerals (OCUVITE) tablet Take 1 tablet by mouth daily.      . Cyanocobalamin (VITAMIN B 12 PO) Take by mouth daily.      Mariane Baumgarten Calcium (STOOL  SOFTENER PO) Take 100 mg by mouth daily.      Marland Kitchen esomeprazole (NEXIUM) 40 MG capsule Take 40 mg by mouth daily at 12 noon.      . ezetimibe-simvastatin (VYTORIN) 10-40 MG per tablet Take 1 tablet by mouth daily.      . furosemide (LASIX) 40 MG tablet Take 1 tablet (40 mg total) by mouth daily.  30 tablet  0  . metFORMIN (GLUCOPHAGE) 500 MG tablet Take by mouth daily.      . Multiple Vitamins-Minerals (MULTIVITAMIN PO) Take by mouth daily.      . nitroGLYCERIN (NITROSTAT) 0.4 MG SL tablet Place 0.4 mg under the tongue every 5 (five) minutes as needed for chest pain.      . traMADol (ULTRAM) 50 MG tablet Take by mouth every 6 (six) hours as needed.       No current facility-administered medications for this visit.    Allergies:    Allergies  Allergen Reactions  . Codeine   . Morphine And Related Nausea And Vomiting    Social History:  The patient  reports that he has never smoked. He does not have any smokeless tobacco history on file.  Family History:  The patient's family history includes CAD in his father.   ROS:  Please see the history of present illness.  No nausea, vomiting.  No fevers, chills.  No focal weakness.  No dysuria. Muscle pains.    All other systems reviewed and negative.   PHYSICAL EXAM: VS:  BP 150/88  Pulse 98  Ht 5\' 10"  (1.778 m)  Wt 239 lb (108.41 kg)  BMI 34.29 kg/m2 Well nourished, well developed, in no acute distress HEENT: normal Neck: no JVD, no carotid bruits Cardiac:  normal S1, S2; RRR;  Lungs:  clear to auscultation bilaterally, no wheezing, rhonchi or rales Abd: soft, nontender, no hepatomegaly Ext: no edema Skin: warm and dry Neuro:   no focal abnormalities noted  EKG:  From 5/15: sinus tach, no ST segment changes   ASSESSMENT AND PLAN:  Atrial fibrillation  Notes: Maintaining NSR. No AFib on monitor in late 2014.  Maze procedure successful.    2. Coronary atherosclerosis of native coronary artery  Notes: No angina. s/p CABG.    3.  Essential hypertension, benign  Continue Metoprolol Tartrate Tablet, 25 mg, 1 tablet, by mouth, Twice a day Notes: Better with Lasix. FOllow at home. Will get better with weight loss.  Spoke at length about weight loss.    4. Anticoagulant long-term use  Off of coumadin or Eliquis since maze procedure appears successful.    Preventive Medicine  Adult topics discussed:  Diet: healthy diet, low calorie, low fat.  Exercise: at least 30 minutes of aerobic exercise, 5 days a week.      Signed, Mina Marble, MD, [facility] 07/30/2014 1:34 PM

## 2014-08-04 ENCOUNTER — Other Ambulatory Visit: Payer: Self-pay | Admitting: Interventional Cardiology

## 2014-09-24 DIAGNOSIS — Z23 Encounter for immunization: Secondary | ICD-10-CM | POA: Diagnosis not present

## 2014-11-08 ENCOUNTER — Encounter: Payer: Self-pay | Admitting: Interventional Cardiology

## 2014-11-26 DIAGNOSIS — C44319 Basal cell carcinoma of skin of other parts of face: Secondary | ICD-10-CM | POA: Diagnosis not present

## 2014-11-26 DIAGNOSIS — L905 Scar conditions and fibrosis of skin: Secondary | ICD-10-CM | POA: Diagnosis not present

## 2014-11-26 DIAGNOSIS — J340 Abscess, furuncle and carbuncle of nose: Secondary | ICD-10-CM | POA: Diagnosis not present

## 2014-11-26 DIAGNOSIS — B9689 Other specified bacterial agents as the cause of diseases classified elsewhere: Secondary | ICD-10-CM | POA: Diagnosis not present

## 2014-11-26 DIAGNOSIS — D485 Neoplasm of uncertain behavior of skin: Secondary | ICD-10-CM | POA: Diagnosis not present

## 2014-11-26 DIAGNOSIS — C44310 Basal cell carcinoma of skin of unspecified parts of face: Secondary | ICD-10-CM | POA: Diagnosis not present

## 2014-11-26 DIAGNOSIS — D225 Melanocytic nevi of trunk: Secondary | ICD-10-CM | POA: Diagnosis not present

## 2014-12-11 DIAGNOSIS — H2513 Age-related nuclear cataract, bilateral: Secondary | ICD-10-CM | POA: Diagnosis not present

## 2014-12-11 DIAGNOSIS — H524 Presbyopia: Secondary | ICD-10-CM | POA: Diagnosis not present

## 2014-12-13 DIAGNOSIS — H35051 Retinal neovascularization, unspecified, right eye: Secondary | ICD-10-CM | POA: Diagnosis not present

## 2014-12-13 DIAGNOSIS — H43811 Vitreous degeneration, right eye: Secondary | ICD-10-CM | POA: Diagnosis not present

## 2014-12-13 DIAGNOSIS — H3531 Nonexudative age-related macular degeneration: Secondary | ICD-10-CM | POA: Diagnosis not present

## 2014-12-13 DIAGNOSIS — H3532 Exudative age-related macular degeneration: Secondary | ICD-10-CM | POA: Diagnosis not present

## 2014-12-25 DIAGNOSIS — E291 Testicular hypofunction: Secondary | ICD-10-CM | POA: Diagnosis not present

## 2014-12-25 DIAGNOSIS — K219 Gastro-esophageal reflux disease without esophagitis: Secondary | ICD-10-CM | POA: Diagnosis not present

## 2014-12-25 DIAGNOSIS — I10 Essential (primary) hypertension: Secondary | ICD-10-CM | POA: Diagnosis not present

## 2014-12-25 DIAGNOSIS — I2510 Atherosclerotic heart disease of native coronary artery without angina pectoris: Secondary | ICD-10-CM | POA: Diagnosis not present

## 2014-12-25 DIAGNOSIS — E1165 Type 2 diabetes mellitus with hyperglycemia: Secondary | ICD-10-CM | POA: Diagnosis not present

## 2014-12-25 DIAGNOSIS — E785 Hyperlipidemia, unspecified: Secondary | ICD-10-CM | POA: Diagnosis not present

## 2014-12-25 DIAGNOSIS — M150 Primary generalized (osteo)arthritis: Secondary | ICD-10-CM | POA: Diagnosis not present

## 2014-12-30 DIAGNOSIS — H35051 Retinal neovascularization, unspecified, right eye: Secondary | ICD-10-CM | POA: Diagnosis not present

## 2014-12-30 DIAGNOSIS — H3532 Exudative age-related macular degeneration: Secondary | ICD-10-CM | POA: Diagnosis not present

## 2015-01-07 DIAGNOSIS — Z08 Encounter for follow-up examination after completed treatment for malignant neoplasm: Secondary | ICD-10-CM | POA: Diagnosis not present

## 2015-01-07 DIAGNOSIS — Z85828 Personal history of other malignant neoplasm of skin: Secondary | ICD-10-CM | POA: Diagnosis not present

## 2015-01-07 DIAGNOSIS — X32XXXD Exposure to sunlight, subsequent encounter: Secondary | ICD-10-CM | POA: Diagnosis not present

## 2015-01-07 DIAGNOSIS — L980 Pyogenic granuloma: Secondary | ICD-10-CM | POA: Diagnosis not present

## 2015-01-07 DIAGNOSIS — L570 Actinic keratosis: Secondary | ICD-10-CM | POA: Diagnosis not present

## 2015-01-16 DIAGNOSIS — M542 Cervicalgia: Secondary | ICD-10-CM | POA: Diagnosis not present

## 2015-01-16 DIAGNOSIS — I10 Essential (primary) hypertension: Secondary | ICD-10-CM | POA: Diagnosis not present

## 2015-01-16 DIAGNOSIS — G8929 Other chronic pain: Secondary | ICD-10-CM | POA: Diagnosis not present

## 2015-01-16 DIAGNOSIS — M25561 Pain in right knee: Secondary | ICD-10-CM | POA: Diagnosis not present

## 2015-01-16 DIAGNOSIS — R0602 Shortness of breath: Secondary | ICD-10-CM | POA: Diagnosis not present

## 2015-01-16 DIAGNOSIS — R9431 Abnormal electrocardiogram [ECG] [EKG]: Secondary | ICD-10-CM | POA: Diagnosis not present

## 2015-01-16 DIAGNOSIS — M62838 Other muscle spasm: Secondary | ICD-10-CM | POA: Diagnosis not present

## 2015-01-29 DIAGNOSIS — H3532 Exudative age-related macular degeneration: Secondary | ICD-10-CM | POA: Diagnosis not present

## 2015-01-29 DIAGNOSIS — H3531 Nonexudative age-related macular degeneration: Secondary | ICD-10-CM | POA: Diagnosis not present

## 2015-01-29 DIAGNOSIS — H35051 Retinal neovascularization, unspecified, right eye: Secondary | ICD-10-CM | POA: Diagnosis not present

## 2015-02-10 ENCOUNTER — Telehealth: Payer: Self-pay | Admitting: Interventional Cardiology

## 2015-02-10 NOTE — Telephone Encounter (Signed)
Called pt back to check in.  Pt has been resting but needs to go to the bathroom very bad and BP is elevated more.  BP 167/108 HR 101 after going to the bathroom.  Pt stated he has a lot going on as he recently put his wife in a nursing a facility as she had a fall, where he caught her just before that causing pain to his shoulder.  Pt stated he is not in physical pain but is very worried about his wife and has not been following his diet as closely. Asked pt to rest, with BR privileges, and i would call back in an hour to check on him.  Pt agreed.    Called pt back he stated he is feeling a bit better and BP is down.  Last time he checked it was: BP 130/80 with HR in 90's-100's. Pt wants to keep appt with Dr. Irish Lack for Wednesday. Pt instructed to call 911 or go to the emergency room if starts to have CP or feels that he needs to be evaluated soon, pt agreed.

## 2015-02-10 NOTE — Telephone Encounter (Signed)
Pt c/o BP issue: STAT if pt c/o blurred vision, one-sided weakness or slurred speech  1. What are your last 5 BP readings? Systolic [id number]. And on the Diastolic it was [phone]  2. Are you having any other symptoms (ex. Dizziness, headache, blurred vision, passed out)? All symptoms   3. What is your BP issue?  Comments: he feels washed out and as if he is going to faint on occasions

## 2015-02-10 NOTE — Telephone Encounter (Signed)
Pt called c/o of elevated BP with headache and neck pain.  Pt has no CP or palpitations.  Has SOB with activity.  Pt morning BP after breakfast and morning coffee of 153/102 HR 98. This morning he almost passed out standing up.  Pt is being treated for macular degeneration which is helping with blurred vision. Pt has taken ASA and Lasix daily and has not have and increase or decrease of weight gain over night >3 pounds. Pt has not taken any over the counter medications.  Pt wants to be evaluated in our office.  Encouraged pt to sit-down with phone near by, as he is home alone, with feet elevated and rest for 45 minutes to an hour and I will call him back to evaluate his status.  Pt agreed with plan.  Instructed pt not to drink caffeine or move around so we can get a true resting blood pressure and heart rate.

## 2015-02-12 ENCOUNTER — Encounter: Payer: Self-pay | Admitting: Interventional Cardiology

## 2015-02-12 ENCOUNTER — Ambulatory Visit (INDEPENDENT_AMBULATORY_CARE_PROVIDER_SITE_OTHER): Payer: Medicare Other | Admitting: Interventional Cardiology

## 2015-02-12 VITALS — BP 132/86 | HR 106 | Ht 70.0 in | Wt 235.8 lb

## 2015-02-12 DIAGNOSIS — I10 Essential (primary) hypertension: Secondary | ICD-10-CM | POA: Diagnosis not present

## 2015-02-12 DIAGNOSIS — R079 Chest pain, unspecified: Secondary | ICD-10-CM | POA: Diagnosis not present

## 2015-02-12 DIAGNOSIS — I2510 Atherosclerotic heart disease of native coronary artery without angina pectoris: Secondary | ICD-10-CM

## 2015-02-12 MED ORDER — LISINOPRIL 5 MG PO TABS: 5.0000 mg | ORAL_TABLET | Freq: Every day | ORAL | Status: DC

## 2015-02-12 NOTE — Patient Instructions (Signed)
Your physician has recommended you make the following change in your medication:  1) START taking Lisinopril 5mg  daily  Your physician recommends that you return for lab work in: 1 week (BMET)  Your physician recommends that you schedule a follow-up appointment as already ordered in August 2016.

## 2015-02-12 NOTE — Progress Notes (Signed)
Patient ID: Bryan Pratt, male   DOB: January 13, 1941, 74 y.o.   MRN: [id number] Patient ID: Bryan Pratt, male   DOB: 1941-02-20, 74 y.o.   MRN: [id number]    [place], [place], [place]  [address] Phone: [phone] Fax:  [phone]  Date:  02/12/2015   ID:  Bryan Pratt, DOB Dec 12, 1940, MRN [id number]  PCP:  [name], MD      History of Present Illness: Bryan Pratt is a 74 y.o. male who has CAD. He has CABG/Maze at [facility] on 04/27/13. He has a stressful life due to his wife's Alzheimers. She fell and had to be placed to a nursing home 4 weeks ago.He has muscle and joint pains. He is not exercising much.    He had swelling which was resolved with Lasix. He is out working in the yard. He still has some minor leg swelling.   CAD/ASCVD:  c/o Dyspnea on exertion no change.  Denies :  Dizziness.  Leg edema.  Nitroglycerin.  Orthopnea.  Palpitations.  Syncope.   He has some sensory changes in his feet and trigger finger sx in his hands.  Exercise has been limited.    He had to go to the ER due to neck and shoulder pain.  His BP has been high.  Yesterday, he had some tingling in his chest.  He checked his BP and it was 160/115.  After several hours, BP improved. Normally, BP is controlled.   His wife was falling and he reached to hold her with her left shoulder.  He tore something and had bruising down the left arm.  He felt a snap in teh shoulder. He did not take a NTG.  He feels fatigued in general.  Wt Readings from Last 3 Encounters:  02/12/15 235 lb 12.8 oz (106.958 kg)  07/30/14 239 lb (108.41 kg)  04/10/14 236 lb 3.2 oz (107.14 kg)     Past Medical History  Diagnosis Date  . Diabetes mellitus without complication   . Coronary atherosclerosis of native coronary artery   . Obesity   . Hyperlipidemia   . Atrial fibrillation   . CAD (coronary artery disease)     s/p cath, occluded LAD with collateral fillling, 2008, last cath 03/03/2009 with CTO  of the LAD multivessell.  . Diabetes     Current Outpatient Prescriptions  Medication Sig Dispense Refill  . Ascorbic Acid (VITAMIN C) 100 MG tablet Take 100 mg by mouth daily.    Marland Kitchen aspirin EC 81 MG tablet Take 1 tablet (81 mg total) by mouth daily. 90 tablet 3  . beta carotene w/minerals (OCUVITE) tablet Take 1 tablet by mouth daily.    . Cyanocobalamin (VITAMIN B 12 PO) Take by mouth daily.    Mariane Baumgarten Calcium (STOOL SOFTENER PO) Take 100 mg by mouth daily.    Marland Kitchen esomeprazole (NEXIUM) 40 MG capsule Take 40 mg by mouth daily at 12 noon.    . ezetimibe-simvastatin (VYTORIN) 10-40 MG per tablet Take 1 tablet by mouth daily.    . furosemide (LASIX) 40 MG tablet TAKE ONE TABLET BY MOUTH ONCE DAILY 30 tablet 11  . metFORMIN (GLUCOPHAGE) 500 MG tablet Take by mouth daily.    . Multiple Vitamins-Minerals (MULTIVITAMIN PO) Take by mouth daily.    . nitroGLYCERIN (NITROSTAT) 0.4 MG SL tablet Place 0.4 mg under the tongue every 5 (five) minutes as needed for chest pain.    Marland Kitchen tobramycin (TOBREX) 0.3 %  ophthalmic solution Place 1 drop into the right eye 2 (two) times daily. Instructed to take eye drop 2 days before and 1 day after eye treatment.    . traMADol (ULTRAM) 50 MG tablet Take by mouth every 6 (six) hours as needed.     No current facility-administered medications for this visit.    Allergies:    Allergies  Allergen Reactions  . Morphine And Related Nausea And Vomiting  . Codeine Nausea And Vomiting    Social History:  The patient  reports that he has never smoked. He does not have any smokeless tobacco history on file.   Family History:  The patient's family history includes CAD in his father; Cancer in his mother; Diabetes type II in his sister; Heart attack in his father; Heart disease in his brother.   ROS:  Please see the history of present illness.  No nausea, vomiting.  No fevers, chills.  No focal weakness.  No dysuria. Muscle pains.  Now being treated for macular egeneration  with injections in the right eye. He has had a few skin lesions- basal cell and melanoma.  Neck pain treated with valium and muscle relaxant. All other systems reviewed and negative.   PHYSICAL EXAM: VS:  BP 132/86 mmHg  Pulse 106  Ht 5\' 10"  (1.778 m)  Wt 235 lb 12.8 oz (106.958 kg)  BMI 33.83 kg/m2 Well nourished, well developed, in no acute distress HEENT: normal Neck: no JVD, no carotid bruits Cardiac:  normal S1, S2; RRR;  Lungs:  clear to auscultation bilaterally, no wheezing, rhonchi or rales Abd: soft, nontender, no hepatomegaly Ext: no edema Skin: warm and dry Neuro:   no focal abnormalities noted Psych: flat affect  EKG:  From 5/15: sinus tach, no ST segment changes   ASSESSMENT AND PLAN:  Atrial fibrillation  Notes: Maintaining NSR. No AFib on monitor in late 2014.  Maze procedure successful. No AFib today.  THat was his biggest concern.    2. Coronary atherosclerosis of native coronary artery/chest pain  Notes: No angina. s/p CABG. CP that he has is more likely related to his shoulder inujury   3. Essential hypertension, benign  Continue Metoprolol Tartrate Tablet, 25 mg, 1 tablet, by mouth, Twice a day Notes: Better with Lasix. FOllow at home. Will get better with weight loss.   Given spike and relative higher BP,  Will add lisinopril 5 mg daily.  BMet in a week. He benefits from an ACE-I for secondary prevention.   4. Anticoagulant long-term use  Off of coumadin or Eliquis since maze procedure appears successful.    Preventive Medicine  Adult topics discussed:  Diet: healthy diet, low calorie, low fat.  Exercise: at least 30 minutes of aerobic exercise, 5 days a week.  Trying to stay active and will start again when he feels better.     Signed, Mina Marble, MD, [facility] 02/12/2015 9:16 AM

## 2015-02-19 ENCOUNTER — Other Ambulatory Visit: Payer: Medicare Other

## 2015-02-20 ENCOUNTER — Other Ambulatory Visit: Payer: Medicare Other

## 2015-02-24 ENCOUNTER — Other Ambulatory Visit (INDEPENDENT_AMBULATORY_CARE_PROVIDER_SITE_OTHER): Payer: Medicare Other | Admitting: *Deleted

## 2015-02-24 DIAGNOSIS — M25512 Pain in left shoulder: Secondary | ICD-10-CM | POA: Diagnosis not present

## 2015-02-24 DIAGNOSIS — J0100 Acute maxillary sinusitis, unspecified: Secondary | ICD-10-CM | POA: Diagnosis not present

## 2015-02-24 DIAGNOSIS — I10 Essential (primary) hypertension: Secondary | ICD-10-CM | POA: Diagnosis not present

## 2015-02-24 DIAGNOSIS — S161XXD Strain of muscle, fascia and tendon at neck level, subsequent encounter: Secondary | ICD-10-CM | POA: Diagnosis not present

## 2015-02-24 LAB — BASIC METABOLIC PANEL
BUN: 12 mg/dL (ref 6–23)
CALCIUM: 9.6 mg/dL (ref 8.4–10.5)
CO2: 27 meq/L (ref 19–32)
CREATININE: 0.79 mg/dL (ref 0.40–1.50)
Chloride: 104 mEq/L (ref 96–112)
GFR: 102.68 mL/min (ref >60.00)
Glucose, Bld: 148 mg/dL — ABNORMAL HIGH (ref 70–99)
Potassium: 3.9 mEq/L (ref 3.5–5.1)
Sodium: 138 mEq/L (ref 135–145)

## 2015-02-25 ENCOUNTER — Telehealth: Payer: Self-pay | Admitting: *Deleted

## 2015-02-25 NOTE — Telephone Encounter (Signed)
Informed pt of lab results and inquired about BP. Pt states that his BP has been much better, 110/80 and lower. Pt states that his heart rate has been staying between 98-108. Pt states that he has sinus and head congestion but his PCP put him on antibiotics for this and on Meloxicam. Pt states that he has 2 and half days of these medications left and he believes that these may be the reason for the higher pulse rate. Pt states that he will monitor BP and pulse once these medications are finished and call our office if pulse rate remains high.  Informed pt that I would route this information to Dr. Irish Lack for review. Pt verbalized understanding and was in agreement with this plan.

## 2015-02-25 NOTE — Telephone Encounter (Signed)
-----   Message from Jettie Booze, MD sent at 02/25/2015 11:56 AM EDT ----- Electrolytes well controlled. How was blood pressure?

## 2015-02-25 NOTE — Telephone Encounter (Signed)
Meloxicam ok short term but would not use it long term.

## 2015-02-25 NOTE — Telephone Encounter (Signed)
Spoke with pt and he states that he will only be on the Meloxicam for about 3 more days. PCP put him on it to help with inflammation of shoulder that was injured when he try to catch his wife when she was falling. Pt getting MRI at the end of the week for shoulder. Informed pt that I would let Dr. Irish Lack know that pt will be off the Meloxicam soon. Pt verbalized understanding and was in agreement with this plan.

## 2015-02-26 DIAGNOSIS — H3532 Exudative age-related macular degeneration: Secondary | ICD-10-CM | POA: Diagnosis not present

## 2015-02-26 DIAGNOSIS — H3531 Nonexudative age-related macular degeneration: Secondary | ICD-10-CM | POA: Diagnosis not present

## 2015-02-26 DIAGNOSIS — H35051 Retinal neovascularization, unspecified, right eye: Secondary | ICD-10-CM | POA: Diagnosis not present

## 2015-03-05 DIAGNOSIS — S46112A Strain of muscle, fascia and tendon of long head of biceps, left arm, initial encounter: Secondary | ICD-10-CM | POA: Diagnosis not present

## 2015-03-05 DIAGNOSIS — S46012A Strain of muscle(s) and tendon(s) of the rotator cuff of left shoulder, initial encounter: Secondary | ICD-10-CM | POA: Diagnosis not present

## 2015-03-05 DIAGNOSIS — M19012 Primary osteoarthritis, left shoulder: Secondary | ICD-10-CM | POA: Diagnosis not present

## 2015-03-20 DIAGNOSIS — S46012A Strain of muscle(s) and tendon(s) of the rotator cuff of left shoulder, initial encounter: Secondary | ICD-10-CM | POA: Diagnosis not present

## 2015-03-20 DIAGNOSIS — S46112A Strain of muscle, fascia and tendon of long head of biceps, left arm, initial encounter: Secondary | ICD-10-CM | POA: Diagnosis not present

## 2015-03-26 DIAGNOSIS — Z23 Encounter for immunization: Secondary | ICD-10-CM | POA: Diagnosis not present

## 2015-03-26 DIAGNOSIS — Z125 Encounter for screening for malignant neoplasm of prostate: Secondary | ICD-10-CM | POA: Diagnosis not present

## 2015-03-26 DIAGNOSIS — I10 Essential (primary) hypertension: Secondary | ICD-10-CM | POA: Diagnosis not present

## 2015-03-26 DIAGNOSIS — Z1211 Encounter for screening for malignant neoplasm of colon: Secondary | ICD-10-CM | POA: Diagnosis not present

## 2015-03-26 DIAGNOSIS — E1165 Type 2 diabetes mellitus with hyperglycemia: Secondary | ICD-10-CM | POA: Diagnosis not present

## 2015-03-26 DIAGNOSIS — E785 Hyperlipidemia, unspecified: Secondary | ICD-10-CM | POA: Diagnosis not present

## 2015-03-26 DIAGNOSIS — M75122 Complete rotator cuff tear or rupture of left shoulder, not specified as traumatic: Secondary | ICD-10-CM | POA: Diagnosis not present

## 2015-03-31 DIAGNOSIS — M6281 Muscle weakness (generalized): Secondary | ICD-10-CM | POA: Diagnosis not present

## 2015-03-31 DIAGNOSIS — M25512 Pain in left shoulder: Secondary | ICD-10-CM | POA: Diagnosis not present

## 2015-03-31 DIAGNOSIS — M25511 Pain in right shoulder: Secondary | ICD-10-CM | POA: Diagnosis not present

## 2015-04-03 DIAGNOSIS — M25511 Pain in right shoulder: Secondary | ICD-10-CM | POA: Diagnosis not present

## 2015-04-03 DIAGNOSIS — M25512 Pain in left shoulder: Secondary | ICD-10-CM | POA: Diagnosis not present

## 2015-04-03 DIAGNOSIS — M6281 Muscle weakness (generalized): Secondary | ICD-10-CM | POA: Diagnosis not present

## 2015-04-07 DIAGNOSIS — M25511 Pain in right shoulder: Secondary | ICD-10-CM | POA: Diagnosis not present

## 2015-04-07 DIAGNOSIS — M6281 Muscle weakness (generalized): Secondary | ICD-10-CM | POA: Diagnosis not present

## 2015-04-07 DIAGNOSIS — M25512 Pain in left shoulder: Secondary | ICD-10-CM | POA: Diagnosis not present

## 2015-04-10 DIAGNOSIS — M25512 Pain in left shoulder: Secondary | ICD-10-CM | POA: Diagnosis not present

## 2015-04-10 DIAGNOSIS — M6281 Muscle weakness (generalized): Secondary | ICD-10-CM | POA: Diagnosis not present

## 2015-04-10 DIAGNOSIS — M25511 Pain in right shoulder: Secondary | ICD-10-CM | POA: Diagnosis not present

## 2015-04-15 DIAGNOSIS — M25511 Pain in right shoulder: Secondary | ICD-10-CM | POA: Diagnosis not present

## 2015-04-15 DIAGNOSIS — M25512 Pain in left shoulder: Secondary | ICD-10-CM | POA: Diagnosis not present

## 2015-04-15 DIAGNOSIS — M6281 Muscle weakness (generalized): Secondary | ICD-10-CM | POA: Diagnosis not present

## 2015-04-16 DIAGNOSIS — M1711 Unilateral primary osteoarthritis, right knee: Secondary | ICD-10-CM | POA: Diagnosis not present

## 2015-04-17 DIAGNOSIS — M25511 Pain in right shoulder: Secondary | ICD-10-CM | POA: Diagnosis not present

## 2015-04-17 DIAGNOSIS — M25512 Pain in left shoulder: Secondary | ICD-10-CM | POA: Diagnosis not present

## 2015-04-17 DIAGNOSIS — M6281 Muscle weakness (generalized): Secondary | ICD-10-CM | POA: Diagnosis not present

## 2015-04-22 DIAGNOSIS — M6281 Muscle weakness (generalized): Secondary | ICD-10-CM | POA: Diagnosis not present

## 2015-04-22 DIAGNOSIS — M25511 Pain in right shoulder: Secondary | ICD-10-CM | POA: Diagnosis not present

## 2015-04-22 DIAGNOSIS — M25512 Pain in left shoulder: Secondary | ICD-10-CM | POA: Diagnosis not present

## 2015-04-23 DIAGNOSIS — H35051 Retinal neovascularization, unspecified, right eye: Secondary | ICD-10-CM | POA: Diagnosis not present

## 2015-04-23 DIAGNOSIS — H43811 Vitreous degeneration, right eye: Secondary | ICD-10-CM | POA: Diagnosis not present

## 2015-04-23 DIAGNOSIS — H35031 Hypertensive retinopathy, right eye: Secondary | ICD-10-CM | POA: Diagnosis not present

## 2015-04-23 DIAGNOSIS — H3532 Exudative age-related macular degeneration: Secondary | ICD-10-CM | POA: Diagnosis not present

## 2015-04-24 DIAGNOSIS — M25511 Pain in right shoulder: Secondary | ICD-10-CM | POA: Diagnosis not present

## 2015-04-24 DIAGNOSIS — M25512 Pain in left shoulder: Secondary | ICD-10-CM | POA: Diagnosis not present

## 2015-04-24 DIAGNOSIS — M6281 Muscle weakness (generalized): Secondary | ICD-10-CM | POA: Diagnosis not present

## 2015-04-29 DIAGNOSIS — M6281 Muscle weakness (generalized): Secondary | ICD-10-CM | POA: Diagnosis not present

## 2015-04-29 DIAGNOSIS — M25512 Pain in left shoulder: Secondary | ICD-10-CM | POA: Diagnosis not present

## 2015-04-29 DIAGNOSIS — M25511 Pain in right shoulder: Secondary | ICD-10-CM | POA: Diagnosis not present

## 2015-05-01 DIAGNOSIS — M25511 Pain in right shoulder: Secondary | ICD-10-CM | POA: Diagnosis not present

## 2015-05-01 DIAGNOSIS — M25512 Pain in left shoulder: Secondary | ICD-10-CM | POA: Diagnosis not present

## 2015-05-01 DIAGNOSIS — M6281 Muscle weakness (generalized): Secondary | ICD-10-CM | POA: Diagnosis not present

## 2015-05-06 DIAGNOSIS — M25512 Pain in left shoulder: Secondary | ICD-10-CM | POA: Diagnosis not present

## 2015-05-06 DIAGNOSIS — M6281 Muscle weakness (generalized): Secondary | ICD-10-CM | POA: Diagnosis not present

## 2015-05-06 DIAGNOSIS — M25511 Pain in right shoulder: Secondary | ICD-10-CM | POA: Diagnosis not present

## 2015-05-13 DIAGNOSIS — M25511 Pain in right shoulder: Secondary | ICD-10-CM | POA: Diagnosis not present

## 2015-05-13 DIAGNOSIS — M75112 Incomplete rotator cuff tear or rupture of left shoulder, not specified as traumatic: Secondary | ICD-10-CM | POA: Diagnosis not present

## 2015-05-13 DIAGNOSIS — M6281 Muscle weakness (generalized): Secondary | ICD-10-CM | POA: Diagnosis not present

## 2015-05-13 DIAGNOSIS — M25512 Pain in left shoulder: Secondary | ICD-10-CM | POA: Diagnosis not present

## 2015-05-15 DIAGNOSIS — M25511 Pain in right shoulder: Secondary | ICD-10-CM | POA: Diagnosis not present

## 2015-05-15 DIAGNOSIS — M75112 Incomplete rotator cuff tear or rupture of left shoulder, not specified as traumatic: Secondary | ICD-10-CM | POA: Diagnosis not present

## 2015-05-15 DIAGNOSIS — M6281 Muscle weakness (generalized): Secondary | ICD-10-CM | POA: Diagnosis not present

## 2015-05-15 DIAGNOSIS — M25512 Pain in left shoulder: Secondary | ICD-10-CM | POA: Diagnosis not present

## 2015-05-16 DIAGNOSIS — I10 Essential (primary) hypertension: Secondary | ICD-10-CM | POA: Diagnosis not present

## 2015-05-16 DIAGNOSIS — R0789 Other chest pain: Secondary | ICD-10-CM | POA: Diagnosis not present

## 2015-05-16 DIAGNOSIS — M1711 Unilateral primary osteoarthritis, right knee: Secondary | ICD-10-CM | POA: Diagnosis not present

## 2015-05-20 DIAGNOSIS — M75112 Incomplete rotator cuff tear or rupture of left shoulder, not specified as traumatic: Secondary | ICD-10-CM | POA: Diagnosis not present

## 2015-05-20 DIAGNOSIS — M25511 Pain in right shoulder: Secondary | ICD-10-CM | POA: Diagnosis not present

## 2015-05-20 DIAGNOSIS — M25512 Pain in left shoulder: Secondary | ICD-10-CM | POA: Diagnosis not present

## 2015-05-20 DIAGNOSIS — M6281 Muscle weakness (generalized): Secondary | ICD-10-CM | POA: Diagnosis not present

## 2015-05-20 DIAGNOSIS — Z1211 Encounter for screening for malignant neoplasm of colon: Secondary | ICD-10-CM | POA: Diagnosis not present

## 2015-05-22 DIAGNOSIS — M6281 Muscle weakness (generalized): Secondary | ICD-10-CM | POA: Diagnosis not present

## 2015-05-22 DIAGNOSIS — M75112 Incomplete rotator cuff tear or rupture of left shoulder, not specified as traumatic: Secondary | ICD-10-CM | POA: Diagnosis not present

## 2015-05-22 DIAGNOSIS — M25512 Pain in left shoulder: Secondary | ICD-10-CM | POA: Diagnosis not present

## 2015-05-22 DIAGNOSIS — M25511 Pain in right shoulder: Secondary | ICD-10-CM | POA: Diagnosis not present

## 2015-05-23 DIAGNOSIS — M1711 Unilateral primary osteoarthritis, right knee: Secondary | ICD-10-CM | POA: Diagnosis not present

## 2015-05-27 DIAGNOSIS — M25512 Pain in left shoulder: Secondary | ICD-10-CM | POA: Diagnosis not present

## 2015-05-27 DIAGNOSIS — M25511 Pain in right shoulder: Secondary | ICD-10-CM | POA: Diagnosis not present

## 2015-05-27 DIAGNOSIS — M6281 Muscle weakness (generalized): Secondary | ICD-10-CM | POA: Diagnosis not present

## 2015-05-27 DIAGNOSIS — M75112 Incomplete rotator cuff tear or rupture of left shoulder, not specified as traumatic: Secondary | ICD-10-CM | POA: Diagnosis not present

## 2015-05-29 DIAGNOSIS — M75112 Incomplete rotator cuff tear or rupture of left shoulder, not specified as traumatic: Secondary | ICD-10-CM | POA: Diagnosis not present

## 2015-05-29 DIAGNOSIS — M25511 Pain in right shoulder: Secondary | ICD-10-CM | POA: Diagnosis not present

## 2015-05-29 DIAGNOSIS — M6281 Muscle weakness (generalized): Secondary | ICD-10-CM | POA: Diagnosis not present

## 2015-05-29 DIAGNOSIS — M25512 Pain in left shoulder: Secondary | ICD-10-CM | POA: Diagnosis not present

## 2015-05-30 DIAGNOSIS — M1711 Unilateral primary osteoarthritis, right knee: Secondary | ICD-10-CM | POA: Diagnosis not present

## 2015-06-02 ENCOUNTER — Other Ambulatory Visit: Payer: Self-pay

## 2015-06-03 DIAGNOSIS — M6281 Muscle weakness (generalized): Secondary | ICD-10-CM | POA: Diagnosis not present

## 2015-06-03 DIAGNOSIS — M25511 Pain in right shoulder: Secondary | ICD-10-CM | POA: Diagnosis not present

## 2015-06-03 DIAGNOSIS — M25512 Pain in left shoulder: Secondary | ICD-10-CM | POA: Diagnosis not present

## 2015-06-03 DIAGNOSIS — M75112 Incomplete rotator cuff tear or rupture of left shoulder, not specified as traumatic: Secondary | ICD-10-CM | POA: Diagnosis not present

## 2015-06-05 DIAGNOSIS — M25512 Pain in left shoulder: Secondary | ICD-10-CM | POA: Diagnosis not present

## 2015-06-05 DIAGNOSIS — M6281 Muscle weakness (generalized): Secondary | ICD-10-CM | POA: Diagnosis not present

## 2015-06-05 DIAGNOSIS — M75112 Incomplete rotator cuff tear or rupture of left shoulder, not specified as traumatic: Secondary | ICD-10-CM | POA: Diagnosis not present

## 2015-06-05 DIAGNOSIS — M25511 Pain in right shoulder: Secondary | ICD-10-CM | POA: Diagnosis not present

## 2015-06-25 DIAGNOSIS — E1165 Type 2 diabetes mellitus with hyperglycemia: Secondary | ICD-10-CM | POA: Diagnosis not present

## 2015-06-25 DIAGNOSIS — E785 Hyperlipidemia, unspecified: Secondary | ICD-10-CM | POA: Diagnosis not present

## 2015-06-25 DIAGNOSIS — I10 Essential (primary) hypertension: Secondary | ICD-10-CM | POA: Diagnosis not present

## 2015-07-14 DIAGNOSIS — M1711 Unilateral primary osteoarthritis, right knee: Secondary | ICD-10-CM | POA: Diagnosis not present

## 2015-07-16 DIAGNOSIS — H3531 Nonexudative age-related macular degeneration: Secondary | ICD-10-CM | POA: Diagnosis not present

## 2015-07-16 DIAGNOSIS — H35051 Retinal neovascularization, unspecified, right eye: Secondary | ICD-10-CM | POA: Diagnosis not present

## 2015-07-16 DIAGNOSIS — H3532 Exudative age-related macular degeneration: Secondary | ICD-10-CM | POA: Diagnosis not present

## 2015-07-16 DIAGNOSIS — H43813 Vitreous degeneration, bilateral: Secondary | ICD-10-CM | POA: Diagnosis not present

## 2015-08-20 DIAGNOSIS — H35051 Retinal neovascularization, unspecified, right eye: Secondary | ICD-10-CM | POA: Diagnosis not present

## 2015-08-20 DIAGNOSIS — H3531 Nonexudative age-related macular degeneration: Secondary | ICD-10-CM | POA: Diagnosis not present

## 2015-08-20 DIAGNOSIS — H3532 Exudative age-related macular degeneration: Secondary | ICD-10-CM | POA: Diagnosis not present

## 2015-08-26 DIAGNOSIS — L249 Irritant contact dermatitis, unspecified cause: Secondary | ICD-10-CM | POA: Diagnosis not present

## 2015-09-07 ENCOUNTER — Other Ambulatory Visit: Payer: Self-pay | Admitting: Interventional Cardiology

## 2015-10-08 ENCOUNTER — Other Ambulatory Visit: Payer: Self-pay | Admitting: Interventional Cardiology

## 2015-10-10 DIAGNOSIS — Z23 Encounter for immunization: Secondary | ICD-10-CM | POA: Diagnosis not present

## 2015-10-16 DIAGNOSIS — H353211 Exudative age-related macular degeneration, right eye, with active choroidal neovascularization: Secondary | ICD-10-CM | POA: Diagnosis not present

## 2015-10-16 DIAGNOSIS — H353122 Nonexudative age-related macular degeneration, left eye, intermediate dry stage: Secondary | ICD-10-CM | POA: Diagnosis not present

## 2015-11-06 ENCOUNTER — Other Ambulatory Visit: Payer: Self-pay | Admitting: Interventional Cardiology

## 2015-11-07 ENCOUNTER — Encounter: Payer: Self-pay | Admitting: Cardiology

## 2015-11-07 ENCOUNTER — Ambulatory Visit (INDEPENDENT_AMBULATORY_CARE_PROVIDER_SITE_OTHER): Payer: Medicare Other | Admitting: Cardiology

## 2015-11-07 VITALS — BP 112/80 | HR 86 | Ht 70.0 in | Wt 237.0 lb

## 2015-11-07 DIAGNOSIS — I25810 Atherosclerosis of coronary artery bypass graft(s) without angina pectoris: Secondary | ICD-10-CM

## 2015-11-07 NOTE — Progress Notes (Signed)
11/07/2015 SCIPIO PRESSNALL   Apr 07, 1941  CI:[id number]  Primary Physician [name], MD Primary Cardiologist: Dr. Irish Lack   Reason for Visit/CC: 6 month follow-up for CAD, PAF and HTN  HPI:  Patient is a 74 year old male, followed by Dr. Irish Lack, who presents to clinic today for routine follow-up. 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 His PCP Dr. Kenton Kingfisher follows his lipid profile. On 04/27/2013 he underwent CABG/maze procedure at [facility]. He has not had any recurrent atrial fibrillation since his maze procedure thus he has been off of anticoagulation therapy.   He presents to clinic today for routine follow-up. He was last seen by Dr. Irish Lack in March of this year and was noted to be stable from a cardiac standpoint. Since his last office visit, he reports that he has continued to do well from a cardiac standpoint. He does report that he recently lost his wife of 37 years in October. He currently resides with one of his sons and continues to grieve her loss. He denies any chest pain or dyspnea. He has experienced some brief palpitations but he seems to think this may be secondary to anxiety from his wife's death. He denies any prolonged episodes and no irregularity to his palpations. EKG today shows NSR. HR 86 bpm. BP is well controlled at 112/80.      Current Outpatient Prescriptions  Medication Sig Dispense Refill  . Ascorbic Acid (VITAMIN C) 100 MG tablet Take 100 mg by mouth daily.    Marland Kitchen aspirin EC 81 MG tablet Take 1 tablet (81 mg total) by mouth daily. 90 tablet 3  . beta carotene w/minerals (OCUVITE) tablet Take 1 tablet by mouth daily.    . Cyanocobalamin (VITAMIN B 12 PO) Take by mouth daily.    Mariane Baumgarten Calcium (STOOL SOFTENER PO) Take 100 mg by mouth daily.    Marland Kitchen ezetimibe-simvastatin (VYTORIN) 10-40 MG per tablet Take 1 tablet by mouth  daily.    . furosemide (LASIX) 40 MG tablet TAKE ONE TABLET BY MOUTH ONCE DAILY. PLEASE CALL FOR FURTHER REFILLS 30 tablet 3  . lisinopril (PRINIVIL,ZESTRIL) 10 MG tablet Take 10 mg by mouth daily.     . metFORMIN (GLUCOPHAGE) 500 MG tablet Take by mouth daily.    . Multiple Vitamins-Minerals (MULTIVITAMIN PO) Take by mouth daily.    . nitroGLYCERIN (NITROSTAT) 0.4 MG SL tablet Place 0.4 mg under the tongue every 5 (five) minutes as needed for chest pain.    . ranitidine (ZANTAC) 300 MG tablet Take 300 mg by mouth 2 (two) times daily.     Marland Kitchen tobramycin (TOBREX) 0.3 % ophthalmic solution Place 1 drop into the right eye 2 (two) times daily. Instructed to take eye drop 2 days before and 1 day after eye treatment.    . traMADol (ULTRAM) 50 MG tablet Take 50 mg by mouth every 6 (six) hours as needed for moderate pain.      No current facility-administered medications for this visit.    Allergies  Allergen Reactions  . Morphine Other (See Comments)    "becomes unresponsive" denies need for ETT  . Morphine And Related Nausea And Vomiting  . Codeine Nausea And Vomiting  . Other Other (See Comments)     apples, dust, ragweed; varies including rash, rhinitis  . Tape Other (See Comments)    Tape after last surgery caused skin breakdown    Social  History   Social History  . Marital Status: Single    Spouse Name: N/A  . Number of Children: N/A  . Years of Education: N/A   Occupational History  . Not on file.   Social History Main Topics  . Smoking status: Never Smoker   . Smokeless tobacco: Not on file  . Alcohol Use: Not on file  . Drug Use: Not on file  . Sexual Activity: Not on file   Other Topics Concern  . Not on file   Social History Narrative     Review of Systems: General: negative for chills, fever, night sweats or weight changes.  Cardiovascular: negative for chest pain, dyspnea on exertion, edema, orthopnea, palpitations, paroxysmal nocturnal dyspnea or shortness of  breath Dermatological: negative for rash Respiratory: negative for cough or wheezing Urologic: negative for hematuria Abdominal: negative for nausea, vomiting, diarrhea, bright red blood per rectum, melena, or hematemesis Neurologic: negative for visual changes, syncope, or dizziness All other systems reviewed and are otherwise negative except as noted above.    Blood pressure 112/80, pulse 86, height 5\' 10"  (1.778 m), weight 237 lb (107.502 kg).  General appearance: alert, cooperative and no distress Neck: no carotid bruit and no JVD Lungs: clear to auscultation bilaterally Heart: regular rate and rhythm, S1, S2 normal, no murmur, click, rub or gallop Extremities: no LEE Pulses: 2+ and symmetric Skin: warm and dry Neurologic: Grossly normal  EKG NSR. 86 bpm. No change from previous.  ASSESSMENT AND PLAN:  1. Atrial fibrillation   Maintaining NSR. Maze procedure successful. No AFib today. No indication for anticoagulation given no afib recurrence. Patient instructed to notify our office if any recurrence palpitations, as he may need assessment with a 30 day monitor.    2. Coronary atherosclerosis of native coronary artery/chest pain  S/p CABG 04/2013 at [facility]. Denies any recurrent anginal symptoms. EKG w/o ischemia. Continue medical theapy. We also discussed increasing physical activity for weight loss.    3. Essential hypertension, benign  BP is well controlled today.  Continue Metoprolol Tartrate Tablet, 25 mg, 1 tablet, by mouth, Twice a day and 10 mg of lisinopril daily.   4. Anticoagulant long-term use  Off of coumadin or Eliquis since maze procedure appears successful.   5. HLD: lipid profile followed by PCP. On Vytorin.           PLAN  F/u with Dr. Irish Lack in 6 months.   Lyda Jester PA-C 11/07/2015 11:56 AM

## 2015-11-07 NOTE — Patient Instructions (Signed)
Medication Instructions:  Your physician recommends that you continue on your current medications as directed. Please refer to the Current Medication list given to you today.  Labwork: NONE  Testing/Procedures: NONE  Follow-Up: Your physician wants you to follow-up in: 6 months with Dr. Irish Lack. You will receive a reminder letter in the mail two months in advance. If you don't receive a letter, please call our office to schedule the follow-up appointment.   If you need a refill on your cardiac medications before your next appointment, please call your pharmacy.

## 2015-11-17 DIAGNOSIS — H353122 Nonexudative age-related macular degeneration, left eye, intermediate dry stage: Secondary | ICD-10-CM | POA: Diagnosis not present

## 2015-11-17 DIAGNOSIS — H353211 Exudative age-related macular degeneration, right eye, with active choroidal neovascularization: Secondary | ICD-10-CM | POA: Diagnosis not present

## 2015-11-17 DIAGNOSIS — H43813 Vitreous degeneration, bilateral: Secondary | ICD-10-CM | POA: Diagnosis not present

## 2015-11-25 DIAGNOSIS — X32XXXD Exposure to sunlight, subsequent encounter: Secondary | ICD-10-CM | POA: Diagnosis not present

## 2015-11-25 DIAGNOSIS — D225 Melanocytic nevi of trunk: Secondary | ICD-10-CM | POA: Diagnosis not present

## 2015-11-25 DIAGNOSIS — Z85828 Personal history of other malignant neoplasm of skin: Secondary | ICD-10-CM | POA: Diagnosis not present

## 2015-11-25 DIAGNOSIS — L570 Actinic keratosis: Secondary | ICD-10-CM | POA: Diagnosis not present

## 2015-11-25 DIAGNOSIS — Z08 Encounter for follow-up examination after completed treatment for malignant neoplasm: Secondary | ICD-10-CM | POA: Diagnosis not present

## 2015-11-25 DIAGNOSIS — L905 Scar conditions and fibrosis of skin: Secondary | ICD-10-CM | POA: Diagnosis not present

## 2015-11-27 DIAGNOSIS — J329 Chronic sinusitis, unspecified: Secondary | ICD-10-CM | POA: Diagnosis not present

## 2015-11-27 DIAGNOSIS — R52 Pain, unspecified: Secondary | ICD-10-CM | POA: Diagnosis not present

## 2015-12-07 HISTORY — PX: CATARACT EXTRACTION, BILATERAL: SHX1313

## 2015-12-09 DIAGNOSIS — M1711 Unilateral primary osteoarthritis, right knee: Secondary | ICD-10-CM | POA: Diagnosis not present

## 2015-12-16 DIAGNOSIS — M1711 Unilateral primary osteoarthritis, right knee: Secondary | ICD-10-CM | POA: Diagnosis not present

## 2015-12-23 DIAGNOSIS — M1711 Unilateral primary osteoarthritis, right knee: Secondary | ICD-10-CM | POA: Diagnosis not present

## 2015-12-24 DIAGNOSIS — I10 Essential (primary) hypertension: Secondary | ICD-10-CM | POA: Diagnosis not present

## 2015-12-24 DIAGNOSIS — E785 Hyperlipidemia, unspecified: Secondary | ICD-10-CM | POA: Diagnosis not present

## 2015-12-24 DIAGNOSIS — Z7984 Long term (current) use of oral hypoglycemic drugs: Secondary | ICD-10-CM | POA: Diagnosis not present

## 2015-12-24 DIAGNOSIS — E1165 Type 2 diabetes mellitus with hyperglycemia: Secondary | ICD-10-CM | POA: Diagnosis not present

## 2016-02-04 DIAGNOSIS — M75102 Unspecified rotator cuff tear or rupture of left shoulder, not specified as traumatic: Secondary | ICD-10-CM | POA: Diagnosis not present

## 2016-02-04 DIAGNOSIS — M1711 Unilateral primary osteoarthritis, right knee: Secondary | ICD-10-CM | POA: Diagnosis not present

## 2016-02-08 ENCOUNTER — Telehealth: Payer: Self-pay | Admitting: Physician Assistant

## 2016-02-08 ENCOUNTER — Other Ambulatory Visit: Payer: Self-pay | Admitting: Interventional Cardiology

## 2016-02-08 MED ORDER — NITROGLYCERIN 0.4 MG SL SUBL: 0.4000 mg | SUBLINGUAL_TABLET | SUBLINGUAL | Status: AC | PRN

## 2016-02-08 NOTE — Telephone Encounter (Signed)
The patient reports he lost his nitro.  New script sent to [facility].  Tarri Fuller PAC

## 2016-02-16 DIAGNOSIS — H353211 Exudative age-related macular degeneration, right eye, with active choroidal neovascularization: Secondary | ICD-10-CM | POA: Diagnosis not present

## 2016-02-16 DIAGNOSIS — H353122 Nonexudative age-related macular degeneration, left eye, intermediate dry stage: Secondary | ICD-10-CM | POA: Diagnosis not present

## 2016-03-08 ENCOUNTER — Other Ambulatory Visit: Payer: Self-pay | Admitting: Interventional Cardiology

## 2016-05-17 DIAGNOSIS — H35431 Paving stone degeneration of retina, right eye: Secondary | ICD-10-CM | POA: Diagnosis not present

## 2016-05-17 DIAGNOSIS — H353212 Exudative age-related macular degeneration, right eye, with inactive choroidal neovascularization: Secondary | ICD-10-CM | POA: Diagnosis not present

## 2016-05-17 DIAGNOSIS — H353123 Nonexudative age-related macular degeneration, left eye, advanced atrophic without subfoveal involvement: Secondary | ICD-10-CM | POA: Diagnosis not present

## 2016-05-17 DIAGNOSIS — H43813 Vitreous degeneration, bilateral: Secondary | ICD-10-CM | POA: Diagnosis not present

## 2016-05-28 DIAGNOSIS — E785 Hyperlipidemia, unspecified: Secondary | ICD-10-CM | POA: Diagnosis not present

## 2016-05-28 DIAGNOSIS — R0789 Other chest pain: Secondary | ICD-10-CM | POA: Diagnosis not present

## 2016-05-28 DIAGNOSIS — Z125 Encounter for screening for malignant neoplasm of prostate: Secondary | ICD-10-CM | POA: Diagnosis not present

## 2016-05-28 DIAGNOSIS — H6123 Impacted cerumen, bilateral: Secondary | ICD-10-CM | POA: Diagnosis not present

## 2016-05-28 DIAGNOSIS — G4489 Other headache syndrome: Secondary | ICD-10-CM | POA: Diagnosis not present

## 2016-05-28 DIAGNOSIS — E1165 Type 2 diabetes mellitus with hyperglycemia: Secondary | ICD-10-CM | POA: Diagnosis not present

## 2016-05-28 DIAGNOSIS — I10 Essential (primary) hypertension: Secondary | ICD-10-CM | POA: Diagnosis not present

## 2016-05-31 ENCOUNTER — Other Ambulatory Visit: Payer: Self-pay | Admitting: Family Medicine

## 2016-05-31 ENCOUNTER — Ambulatory Visit
Admission: RE | Admit: 2016-05-31 | Discharge: 2016-05-31 | Disposition: A | Discharge status: Home / Self Care | Payer: Medicare Other | Source: Ambulatory Visit | Attending: Family Medicine | Admitting: Family Medicine

## 2016-05-31 DIAGNOSIS — G4489 Other headache syndrome: Secondary | ICD-10-CM

## 2016-05-31 DIAGNOSIS — R079 Chest pain, unspecified: Secondary | ICD-10-CM

## 2016-05-31 DIAGNOSIS — R0602 Shortness of breath: Secondary | ICD-10-CM | POA: Diagnosis not present

## 2016-05-31 DIAGNOSIS — R05 Cough: Secondary | ICD-10-CM | POA: Diagnosis not present

## 2016-06-04 ENCOUNTER — Ambulatory Visit
Admission: RE | Admit: 2016-06-04 | Discharge: 2016-06-04 | Disposition: A | Discharge status: Home / Self Care | Payer: Medicare Other | Source: Ambulatory Visit | Attending: Family Medicine | Admitting: Family Medicine

## 2016-06-04 DIAGNOSIS — R42 Dizziness and giddiness: Secondary | ICD-10-CM | POA: Diagnosis not present

## 2016-06-04 DIAGNOSIS — G4489 Other headache syndrome: Secondary | ICD-10-CM

## 2016-06-06 ENCOUNTER — Other Ambulatory Visit: Payer: Medicare Other

## 2016-06-23 DIAGNOSIS — M1711 Unilateral primary osteoarthritis, right knee: Secondary | ICD-10-CM | POA: Diagnosis not present

## 2016-06-30 DIAGNOSIS — M1711 Unilateral primary osteoarthritis, right knee: Secondary | ICD-10-CM | POA: Diagnosis not present

## 2016-07-07 DIAGNOSIS — M1711 Unilateral primary osteoarthritis, right knee: Secondary | ICD-10-CM | POA: Diagnosis not present

## 2016-07-28 DIAGNOSIS — H353233 Exudative age-related macular degeneration, bilateral, with inactive scar: Secondary | ICD-10-CM | POA: Diagnosis not present

## 2016-07-28 DIAGNOSIS — H25813 Combined forms of age-related cataract, bilateral: Secondary | ICD-10-CM | POA: Diagnosis not present

## 2016-07-28 DIAGNOSIS — H524 Presbyopia: Secondary | ICD-10-CM | POA: Diagnosis not present

## 2016-08-23 DIAGNOSIS — H2511 Age-related nuclear cataract, right eye: Secondary | ICD-10-CM | POA: Diagnosis not present

## 2016-08-24 DIAGNOSIS — H35431 Paving stone degeneration of retina, right eye: Secondary | ICD-10-CM | POA: Diagnosis not present

## 2016-08-24 DIAGNOSIS — H353212 Exudative age-related macular degeneration, right eye, with inactive choroidal neovascularization: Secondary | ICD-10-CM | POA: Diagnosis not present

## 2016-08-24 DIAGNOSIS — E119 Type 2 diabetes mellitus without complications: Secondary | ICD-10-CM | POA: Diagnosis not present

## 2016-08-24 DIAGNOSIS — H353123 Nonexudative age-related macular degeneration, left eye, advanced atrophic without subfoveal involvement: Secondary | ICD-10-CM | POA: Diagnosis not present

## 2016-09-08 DIAGNOSIS — H2511 Age-related nuclear cataract, right eye: Secondary | ICD-10-CM | POA: Diagnosis not present

## 2016-09-08 DIAGNOSIS — H25811 Combined forms of age-related cataract, right eye: Secondary | ICD-10-CM | POA: Diagnosis not present

## 2016-09-16 DIAGNOSIS — H2512 Age-related nuclear cataract, left eye: Secondary | ICD-10-CM | POA: Diagnosis not present

## 2016-09-27 DIAGNOSIS — H2512 Age-related nuclear cataract, left eye: Secondary | ICD-10-CM | POA: Diagnosis not present

## 2016-09-27 DIAGNOSIS — H25812 Combined forms of age-related cataract, left eye: Secondary | ICD-10-CM | POA: Diagnosis not present

## 2016-11-03 DIAGNOSIS — Z23 Encounter for immunization: Secondary | ICD-10-CM | POA: Diagnosis not present

## 2016-11-18 DIAGNOSIS — R5383 Other fatigue: Secondary | ICD-10-CM | POA: Diagnosis not present

## 2016-11-18 DIAGNOSIS — E1165 Type 2 diabetes mellitus with hyperglycemia: Secondary | ICD-10-CM | POA: Diagnosis not present

## 2016-11-18 DIAGNOSIS — E7800 Pure hypercholesterolemia, unspecified: Secondary | ICD-10-CM | POA: Diagnosis not present

## 2016-11-18 DIAGNOSIS — M65329 Trigger finger, unspecified index finger: Secondary | ICD-10-CM | POA: Diagnosis not present

## 2016-11-18 DIAGNOSIS — I10 Essential (primary) hypertension: Secondary | ICD-10-CM | POA: Diagnosis not present

## 2016-11-18 DIAGNOSIS — H9313 Tinnitus, bilateral: Secondary | ICD-10-CM | POA: Diagnosis not present

## 2016-11-18 DIAGNOSIS — Z7984 Long term (current) use of oral hypoglycemic drugs: Secondary | ICD-10-CM | POA: Diagnosis not present

## 2016-11-23 DIAGNOSIS — H353212 Exudative age-related macular degeneration, right eye, with inactive choroidal neovascularization: Secondary | ICD-10-CM | POA: Diagnosis not present

## 2016-11-23 DIAGNOSIS — H35431 Paving stone degeneration of retina, right eye: Secondary | ICD-10-CM | POA: Diagnosis not present

## 2016-11-23 DIAGNOSIS — H353123 Nonexudative age-related macular degeneration, left eye, advanced atrophic without subfoveal involvement: Secondary | ICD-10-CM | POA: Diagnosis not present

## 2016-11-23 DIAGNOSIS — E119 Type 2 diabetes mellitus without complications: Secondary | ICD-10-CM | POA: Diagnosis not present

## 2016-11-24 DIAGNOSIS — H8143 Vertigo of central origin, bilateral: Secondary | ICD-10-CM | POA: Diagnosis not present

## 2016-11-24 DIAGNOSIS — J342 Deviated nasal septum: Secondary | ICD-10-CM | POA: Diagnosis not present

## 2016-11-24 DIAGNOSIS — H6122 Impacted cerumen, left ear: Secondary | ICD-10-CM | POA: Diagnosis not present

## 2016-11-24 DIAGNOSIS — H903 Sensorineural hearing loss, bilateral: Secondary | ICD-10-CM | POA: Diagnosis not present

## 2016-11-24 DIAGNOSIS — H9313 Tinnitus, bilateral: Secondary | ICD-10-CM | POA: Diagnosis not present

## 2016-12-15 DIAGNOSIS — H903 Sensorineural hearing loss, bilateral: Secondary | ICD-10-CM | POA: Diagnosis not present

## 2016-12-15 DIAGNOSIS — J342 Deviated nasal septum: Secondary | ICD-10-CM | POA: Diagnosis not present

## 2016-12-15 DIAGNOSIS — H9313 Tinnitus, bilateral: Secondary | ICD-10-CM | POA: Diagnosis not present

## 2016-12-15 DIAGNOSIS — H8143 Vertigo of central origin, bilateral: Secondary | ICD-10-CM | POA: Diagnosis not present

## 2016-12-23 ENCOUNTER — Ambulatory Visit: Payer: Medicare Other | Admitting: Neurology

## 2016-12-24 ENCOUNTER — Telehealth: Payer: Self-pay | Admitting: *Deleted

## 2016-12-24 NOTE — Telephone Encounter (Signed)
Called and spoke to pt. R/s his appt to 01/12/17 at 1:30pm, check in 1:00pm. Placed on cancellation list per pt request. He would like to come in sooner if possible.

## 2016-12-28 DIAGNOSIS — M65331 Trigger finger, right middle finger: Secondary | ICD-10-CM | POA: Diagnosis not present

## 2016-12-28 DIAGNOSIS — M65341 Trigger finger, right ring finger: Secondary | ICD-10-CM | POA: Diagnosis not present

## 2017-01-11 DIAGNOSIS — M65342 Trigger finger, left ring finger: Secondary | ICD-10-CM | POA: Diagnosis not present

## 2017-01-11 DIAGNOSIS — M65332 Trigger finger, left middle finger: Secondary | ICD-10-CM | POA: Diagnosis not present

## 2017-01-12 ENCOUNTER — Encounter: Payer: Self-pay | Admitting: Neurology

## 2017-01-12 ENCOUNTER — Ambulatory Visit (INDEPENDENT_AMBULATORY_CARE_PROVIDER_SITE_OTHER): Payer: Medicare Other | Admitting: Neurology

## 2017-01-12 VITALS — BP 128/81 | HR 109 | Ht 70.0 in | Wt 236.2 lb

## 2017-01-12 DIAGNOSIS — G458 Other transient cerebral ischemic attacks and related syndromes: Secondary | ICD-10-CM

## 2017-01-12 DIAGNOSIS — R55 Syncope and collapse: Secondary | ICD-10-CM | POA: Diagnosis not present

## 2017-01-12 DIAGNOSIS — R42 Dizziness and giddiness: Secondary | ICD-10-CM

## 2017-01-12 DIAGNOSIS — I639 Cerebral infarction, unspecified: Secondary | ICD-10-CM

## 2017-01-12 NOTE — Patient Instructions (Signed)
Remember to drink plenty of fluid, eat healthy meals and do not skip any meals. Try to eat protein with a every meal and eat a healthy snack such as fruit or nuts in between meals. Try to keep a regular sleep-wake schedule and try to exercise daily, particularly in the form of walking, 20-30 minutes a day, if you can.   As far as diagnostic testing: Lab, CTA of the head and neck, vestibular therapy  I would like to see you back after vestibular therapy if needed, sooner if we need to. Please call us with any interim questions, concerns, problems, updates or refill requests.   Our phone number is [phone]. We also have an after hours call service for urgent matters and there is a physician on-call for urgent questions. For any emergencies you know to call 911 or go to the nearest emergency room

## 2017-01-12 NOTE — Progress Notes (Signed)
[facility]    Provider:  Dr Jaynee Eagles Referring Provider: Minna Merritts, MD Primary Care Physician:  [name], MD  CC:  vertigo  HPI:  Bryan Pratt is a 76 y.o. male here as a referral from Dr. Kenton Kingfisher for vertigo and tinnitus in both ears.Past medical history cardiac catheterization, open heart surgery and bypass, hyperlipidemia, diabetes, hemorrhoids. He started having vertigo symptoms 35 years ago and was diagnosed with Meniere's disease. Occ vertigo over the years. A few months ago he had several bad attacks, had to hold onto the table to stop from falling, severe vertigo. He was evaluated by Dr. Ernesto Rutherford who disagreed with the Meniere's disease. The worst episode lasted 60 - 120 seconds, severe, room spinning, closing his eyes and sitting still helps. Last episode 3 weeks ago. Unknown triggers. If he turns abruptly any way he can get a feeling like his equilibrium is impaired and has to grab something however vertigo can happen without sudden movements . Some slight nausea but no vomiting. He has Tinnitus starting about 3 months ago. Dizziness, vertigo and syncope. No other focal neurologic deficits, associated symptoms, inciting events or modifiable factors.  Reviewed notes, labs and imaging from outside physicians, which showed:  Reviewed and hasn't throat Dr. Jeneen Rinks Crossley's notes. Patient was seen for a chief complaint of vertigo and tinnitus in both ears. He had a heart bypass in the past. He also has arthritis and diabetes. He was told he had Mnire's disease in the past has had some vertigo spells where he was concerned about his balance and following some of this is orthostatic hypotension. He is getting up quickly and getting just a lightheaded feeling. ENT examination showed tympanic membranes look excellent and moved well, audiogram is also excellent at 100% discrimination on both ears, high-frequency mild loss, normal impedance study. Nose and throat were  completely clear. Ears were clear after removing cerumen. He has a market septal deviation. Neck is normal. Larynx is normal. Mouth exam is normal. Exam was completely normal. Neurology was consulted for other causes of vertigo and concerns for falls.  Personally reviewed images and agree with the following: MRI brain 06/2016:  Mild diffuse prominence of the CSF containing spaces is compatible with generalized cerebral atrophy, within normal limits for patient age. Minimal patchy and confluent T2/FLAIR hyperintensity present within the periventricular and deep white matter both cerebral hemispheres, nonspecific, but most like related to chronic small vessel ischemic disease, and felt to be within normal limits for patient age.  No abnormal foci of restricted diffusion to suggest acute or subacute infarction. Gray-white matter differentiation is well maintained. Major intracranial vascular flow voids are well preserved. No acute or chronic intracranial hemorrhage. No areas of chronic infarction.  No mass lesion, midline shift, or mass effect. No hydrocephalus. Major dural sinuses are grossly patent. No extra-axial fluid collection.  Craniocervical junction within normal limits. Mild degenerative disc bulging noted at C2-3 and C3-4 without significant stenosis. Remainder the visualized upper cervical spine otherwise unremarkable.  Pituitary gland within normal limits. No acute abnormality about the globes and orbits.  Minimal mucosal thickening within the inferior maxillary sinuses. Paranasal sinuses are otherwise clear. No mastoid effusion. Inner ear structures grossly unremarkable.  Bone marrow signal intensity within normal limits. No scalp soft tissue abnormality.  IMPRESSION: Normal brain MRI for patient age.  Review of Systems: Patient complains of symptoms per HPI as well as the following symptoms: no CP, no SOB. Pertinent negatives per HPI. All others  negative.  Social History   Social History  . Marital status: Widowed    Spouse name: N/A  . Number of children: 3  . Years of education: [place]   Occupational History  . Retired    Social History Main Topics  . Smoking status: Never Smoker  . Smokeless tobacco: Never Used  . Alcohol use No  . Drug use: No  . Sexual activity: Not on file   Other Topics Concern  . Not on file   Social History Narrative   Lives at home w/ his son   Right-handed   Caffeine: at least  3 cups coffee per day    Family History  Problem Relation Age of Onset  . CAD Father   . Heart attack Father   . Hypertension Father   . Stroke Father   . Cancer Mother   . Diabetes type II Sister   . Heart disease Brother     Past Medical History:  Diagnosis Date  . Atrial fibrillation ([place])   . CAD (coronary artery disease)    s/p cath, occluded LAD with collateral fillling, 2008, last cath 03/03/2009 with CTO of the LAD multivessell.  . Coronary atherosclerosis of native coronary artery   . Diabetes ([place])   . Hyperlipidemia   . Hypertension   . Macular degeneration   . Obesity   . Rotator cuff tear, left     Past Surgical History:  Procedure Laterality Date  . CATARACT EXTRACTION, BILATERAL  2017  . CORONARY ARTERY BYPASS GRAFT  2014  . CYST EXCISION     Cheek  . KNEE SURGERY    . MAZE  2014   For a fib  . TONSILLECTOMY      Current Outpatient Prescriptions  Medication Sig Dispense Refill  . Ascorbic Acid (VITAMIN C) 100 MG tablet Take 100 mg by mouth daily.    Marland Kitchen aspirin EC 81 MG tablet Take 1 tablet (81 mg total) by mouth daily. 90 tablet 3  . beta carotene w/minerals (OCUVITE) tablet Take 1 tablet by mouth daily.    . Cyanocobalamin (VITAMIN B 12 PO) Take by mouth daily.    Mariane Baumgarten Calcium (STOOL SOFTENER PO) Take 100 mg by mouth daily.    Marland Kitchen ezetimibe-simvastatin (VYTORIN) 10-40 MG per tablet Take 1 tablet by mouth daily.    . furosemide (LASIX) 40 MG tablet Take 1  tablet (40 mg total) by mouth daily. 90 tablet 2  . lisinopril (PRINIVIL,ZESTRIL) 10 MG tablet Take 10 mg by mouth daily.     . metFORMIN (GLUCOPHAGE) 500 MG tablet Take by mouth daily.    . Multiple Vitamins-Minerals (MULTIVITAMIN PO) Take by mouth daily.    . nitroGLYCERIN (NITROSTAT) 0.4 MG SL tablet Place 1 tablet (0.4 mg total) under the tongue every 5 (five) minutes as needed for chest pain. 25 tablet 11  . ranitidine (ZANTAC) 300 MG tablet Take 300 mg by mouth 2 (two) times daily.     Marland Kitchen tobramycin (TOBREX) 0.3 % ophthalmic solution Place 1 drop into the right eye 2 (two) times daily. Instructed to take eye drop 2 days before and 1 day after eye treatment.    . traMADol (ULTRAM) 50 MG tablet Take 50 mg by mouth every 6 (six) hours as needed for moderate pain.      No current facility-administered medications for this visit.     Allergies as of 01/12/2017 - Review Complete 01/12/2017  Allergen Reaction Noted  . Morphine Other (See Comments)  11/07/2015  . Morphine and related Nausea And Vomiting 03/07/2013  . Codeine Nausea And Vomiting 03/07/2013  . Other Other (See Comments) 11/07/2015  . Tape Other (See Comments) 11/07/2015    Vitals: BP 128/81 (Patient Position: Standing)   Pulse (!) 109   Ht 5\' 10"  (1.778 m)   Wt 236 lb 3.2 oz (107.1 kg)   BMI 33.89 kg/m  Last Weight:  Wt Readings from Last 1 Encounters:  01/12/17 236 lb 3.2 oz (107.1 kg)   Last Height:   Ht Readings from Last 1 Encounters:  01/12/17 5\' 10"  (1.778 m)   Physical exam: Exam: Gen: NAD, conversant, well nourised, obese, well groomed                     CV: RRR, no MRG. No Carotid Bruits. No peripheral edema, warm, nontender Eyes: Conjunctivae clear without exudates or hemorrhage  Neuro: Detailed Neurologic Exam  Speech:    Speech is normal; fluent and spontaneous with normal comprehension.  Cognition:    The patient is oriented to person, place, and time;     recent and remote memory intact;      language fluent;     normal attention, concentration,     fund of knowledge Cranial Nerves:    The pupils are equal, round, and reactive to light. The fundi are normal and spontaneous venous pulsations are present. Visual fields are full to finger confrontation. Extraocular movements are intact. Trigeminal sensation is intact and the muscles of mastication are normal. The face is symmetric. The palate elevates in the midline. Hearing intact. Voice is normal. Shoulder shrug is normal. The tongue has normal motion without fasciculations.   Coordination:    Normal finger to nose and heel to shin. Normal rapid alternating movements.   Gait:    Heel-toe and tandem gait are normal.   Motor Observation:    No asymmetry, no atrophy, and no involuntary movements noted. Tone:    Normal muscle tone.    Posture:    Posture is normal. normal erect    Strength:    Strength is V/V in the upper and lower limbs.      Sensation: intact to LT     Reflex Exam:  DTR's:    Deep tendon reflexes in the upper and lower extremities are normal bilaterally.   Toes:    The toes are downgoing bilaterally.   Clonus:    Clonus is absent.     Assessment/Plan:  76 year old with vertigo, MRI of the brain normal, ENT evaluation without etiology. Continues to have episodes. Will order CTA of the head and neck to evaluate for vascular causes of vertigo such as stenosis, causes of posterior circulation TIA or other etiologies.  CTA head and neck Vestibular therapy F/u as needed  Sarina Ill, MD  [facility] [address] [place], [place] [address]  Phone [phone] Fax [phone]

## 2017-01-13 ENCOUNTER — Telehealth: Payer: Self-pay

## 2017-01-13 LAB — BASIC METABOLIC PANEL
BUN/Creatinine Ratio: 21 (ref 10–24)
BUN: 19 mg/dL (ref 8–27)
CALCIUM: 10.3 mg/dL — AB (ref 8.6–10.2)
CHLORIDE: 99 mmol/L (ref 96–106)
CO2: 27 mmol/L (ref 18–29)
Creatinine, Ser: 0.9 mg/dL (ref 0.76–1.27)
GFR calc non Af Amer: 84 mL/min/{1.73_m2} (ref >59)
GFR, EST AFRICAN AMERICAN: 98 mL/min/{1.73_m2} (ref >59)
GLUCOSE: 167 mg/dL — AB (ref 65–99)
Potassium: 5 mmol/L (ref 3.5–5.2)
Sodium: 143 mmol/L (ref 134–144)

## 2017-01-13 NOTE — Telephone Encounter (Signed)
Called pt w/ unremarkable lab results. Verbalized understanding and appreciation for call. 

## 2017-01-13 NOTE — Telephone Encounter (Signed)
-----   Message from Melvenia Beam, MD sent at 01/13/2017  9:52 AM EST ----- Lab unremarkable thanks

## 2017-01-24 ENCOUNTER — Ambulatory Visit: Payer: Medicare Other | Attending: Neurology | Admitting: Physical Therapy

## 2017-01-24 DIAGNOSIS — R42 Dizziness and giddiness: Secondary | ICD-10-CM | POA: Insufficient documentation

## 2017-01-24 NOTE — Therapy (Signed)
[facility] [address] [place], [place], [address] Phone: [phone]   Fax:  [phone]  Physical Therapy Evaluation  Patient Details  Name: Bryan Pratt MRN: [id number] Date of Birth: [birth year] Referring Provider: Dr. Sarina Ill  Encounter Date: 01/24/2017      PT End of Session - 01/24/17 1349    Visit Number 1   Number of Visits 4   Date for PT Re-Evaluation 02/21/17   PT Start Time 1315   PT Stop Time 1346   PT Time Calculation (min) 31 min   Activity Tolerance Patient tolerated treatment well   Behavior During Therapy [facility] for tasks assessed/performed      Past Medical History:  Diagnosis Date  . Atrial fibrillation ([place])   . CAD (coronary artery disease)    s/p cath, occluded LAD with collateral fillling, 2008, last cath 03/03/2009 with CTO of the LAD multivessell.  . Coronary atherosclerosis of native coronary artery   . Diabetes ([place])   . Hyperlipidemia   . Hypertension   . Macular degeneration   . Obesity   . Rotator cuff tear, left     Past Surgical History:  Procedure Laterality Date  . CATARACT EXTRACTION, BILATERAL  2017  . CORONARY ARTERY BYPASS GRAFT  2014  . CYST EXCISION     Cheek  . KNEE SURGERY    . MAZE  2014   For a fib  . TONSILLECTOMY      There were no vitals filed for this visit.       Subjective Assessment - 01/24/17 1319    Subjective Pt is a 76 y/o male who presents to OPPT with 30 yr hx of Meniere's Disease.  Pt reports intermittent episodes of dizziness, with recent onset 2 weeks ago.  Pt went to ENT and Dr. Ernesto Rutherford stated he doesn't have Meniere's, then referred to neurologist.  Neurologist then referred to PT and plan for CT with dye.  Pt denies any symptoms in 3 weeks.   Pertinent History a fib, CAD, DM, HTN, HLD, macular degeneration, recent removal of cataracts   Limitations Standing;Walking;House hold activities   Patient Stated Goals improve  dizziness   Currently in Pain? No/denies            [facility] PT Assessment - 01/24/17 1322      Assessment   Medical Diagnosis dizziness, vertigo   Referring Provider Dr. Sarina Ill   Onset Date/Surgical Date --  6 yr hx with recent exacerbation x 3-4 weeks ago   Next MD Visit after results of CT scan   Prior Therapy none for this     Precautions   Precautions None     Restrictions   Weight Bearing Restrictions No     Balance Screen   Has the patient fallen in the past 6 months No   Has the patient had a decrease in activity level because of a fear of falling?  Yes   Is the patient reluctant to leave their home because of a fear of falling?  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   Leisure water activities; sons have boats at [employer]   Overall Cognitive Status Within Functional Limits for tasks assessed     Observation/Other Assessments   Focus on Therapeutic Outcomes (FOTO)  51 (49% limited; predicted 29% limited)   Dizziness Handicap Inventory ([place])  62 (severe)            Vestibular Assessment - 01/24/17 1328      Vestibular Assessment   General Observation no symptoms currently     Symptom Behavior   Type of Dizziness Imbalance  "dysequilibrium" with spinning sensation   Frequency of Dizziness feels he could make it happen by "doing something athletic"   Duration of Dizziness 1 min or less   Aggravating Factors No known aggravating factors   Relieving Factors Lying supine;Head stationary  "immobility"     Occulomotor Exam   Occulomotor Alignment Normal   Spontaneous  Absent   Gaze-induced Absent   Smooth Pursuits Intact   Saccades Intact   Comment Head [place] Positive to L; negative to R     Vestibulo-Occular Reflex   VOR 1 Head Only (x 1 viewing) WNL     Positional Testing   Sidelying Test Sidelying Right;Sidelying Left   Horizontal Canal Testing Horizontal Canal Right;Horizontal Canal Left     Sidelying Right   Sidelying Right Duration none   Sidelying Right Symptoms No nystagmus     Sidelying Left   Sidelying Left Duration none   Sidelying Left Symptoms No nystagmus     Horizontal Canal Right   Horizontal Canal Right Duration none   Horizontal Canal Right Symptoms Normal     Horizontal Canal Left   Horizontal Canal Left Duration none   Horizontal Canal Left Symptoms Normal                       PT Education - 01/24/17 1349    Education provided Yes   Education Details Meniere's Dx, clinical findings and POC   Person(s) Educated Patient   Methods Explanation   Comprehension Verbalized understanding             PT Long Term Goals - 01/24/17 1350      PT LONG TERM GOAL #1   Title perform SOT with goal to be written as indicated (02/21/17)   Time 4   Period Weeks   Status New     PT LONG TERM GOAL #2   Title independent with HEP as indicated (02/21/17)   Time 4   Period Weeks   Status New               Plan - 01/24/17 1350    Clinical Impression Statement Pt is a 76 y/o male who presents to OPPT for low complexity PT eval for dizziness.  Unable to provoke symptoms in clinic today, but feel additional testing may be warrented.  Will plan to see PRN to address any balance deficits or decreased vestibular input.     Rehab Potential Good   PT Frequency 1x / week   PT Duration 4 weeks   PT Treatment/Interventions ADLs/Self Care Home Management;Canalith Repostioning;Neuromuscular re-education;Balance training;Therapeutic exercise;Therapeutic activities;Functional mobility training;Stair training;Gait  training;Patient/family education;Vestibular   PT Next Visit Plan assess SOT and given results create HEP or plan to hold if no symptoms   Consulted and Agree with Plan of Care Patient      Patient will benefit from skilled therapeutic  intervention in order to improve the following deficits and impairments:  Dizziness  Visit Diagnosis: Dizziness and giddiness - Plan: PT plan of care cert/re-cert      G-Codes - [id number]    Functional Limitation Mobility: Walking and moving around  clinical judgement, DHI, asymptomatic   Mobility: Walking and Moving Around Current Status [id number]) At least 20 percent but less than 40 percent impaired, limited or restricted   Mobility: Walking and Moving Around Goal Status [phone]) At least 1 percent but less than 20 percent impaired, limited or restricted       Problem List Patient Active Problem List   Diagnosis Date Noted  . Essential hypertension, benign 07/30/2014  . Obesity, unspecified 07/30/2014  . Chest pain 04/10/2014  . Diabetes ([place]) 04/10/2014  . Dyslipidemia 04/10/2014  . 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 Coronary atherosclerosis of native coronary artery       Laureen Abrahams, PT, DPT 01/24/17 1:57 PM    [facility] [address] [place], [place], [address] Phone: [phone]   Fax:  [phone]  Name: Bryan Pratt MRN: [id number] Date of Birth: 1941/06/18

## 2017-01-31 ENCOUNTER — Ambulatory Visit: Payer: Medicare Other | Admitting: Physical Therapy

## 2017-01-31 DIAGNOSIS — R42 Dizziness and giddiness: Secondary | ICD-10-CM | POA: Diagnosis not present

## 2017-01-31 NOTE — Patient Instructions (Signed)
Feet Together (Compliant Surface) Varied Arm Positions - Eyes Closed    Stand on compliant surface: __pllow or cushion______ with feet together and arms at your side. Close eyes and stand still. Hold__10__ seconds. Repeat _5___ times per session. Do __2-3__ sessions per day.  Copyright  VHI. All rights reserved.    Feet Together (Compliant Surface) Head Motion - Eyes Open    With eyes open, standing on compliant surface: _pillow or cushion___, feet together, move head slowly: up and down 10 times and side to side 10 times. Repeat __1-2__ times per session. Do __2-3__ sessions per day.  Copyright  VHI. All rights reserved.   Feet Apart (Compliant Surface) Head Motion - Eyes Closed    Stand on compliant surface: ___pillow or cushion___ with feet shoulder width apart. Close eyes and move head slowly, up and down 10 times and side to side 10 times. Repeat __1-2__ times per session. Do __2-3__ sessions per day.  Copyright  VHI. All rights reserved.

## 2017-01-31 NOTE — Therapy (Addendum)
[facility] [address] [place], [place], [address] Phone: [phone]   Fax:  [phone]  Physical Therapy Treatment  Patient Details  Name: Bryan Pratt MRN: [id number] Date of Birth: 04-25-1941 Referring Provider: Dr. Sarina Ill  Encounter Date: 01/31/2017      PT End of Session - 01/31/17 1438    Visit Number 2   Number of Visits 4   Date for PT Re-Evaluation 02/21/17   PT Start Time [id number]   PT Stop Time 1437   PT Time Calculation (min) 39 min   Activity Tolerance Patient tolerated treatment well   Behavior During Therapy [facility] for tasks assessed/performed      Past Medical History:  Diagnosis Date  . Atrial fibrillation ([place])   . CAD (coronary artery disease)    s/p cath, occluded LAD with collateral fillling, 2008, last cath 03/03/2009 with CTO of the LAD multivessell.  . Coronary atherosclerosis of native coronary artery   . Diabetes ([place])   . Hyperlipidemia   . Hypertension   . Macular degeneration   . Obesity   . Rotator cuff tear, left     Past Surgical History:  Procedure Laterality Date  . CATARACT EXTRACTION, BILATERAL  2017  . CORONARY ARTERY BYPASS GRAFT  2014  . CYST EXCISION     Cheek  . KNEE SURGERY    . MAZE  2014   For a fib  . TONSILLECTOMY      There were no vitals filed for this visit.      Subjective Assessment - 01/31/17 1400    Subjective denies any dizziness, reports some dysequilibrium issues especially in the shower and when eyes are closed   Pertinent History a fib, CAD, DM, HTN, HLD, macular degeneration, recent removal of cataracts   Limitations Standing;Walking;House hold activities   Patient Stated Goals improve dizziness   Currently in Pain? No/denies         Neuro re-ed: sensory organization test performed with following results: Conditions: 1: above avg 2: above avg 3: above avg  4: fall trial 1, below avg trial 2, avg trial 3 5: fall x2,  above avg trial 3 6: fall x1, above avg trial 2 and 3 Composite score: 55 (avg ~ 65%) Sensory Analysis Som: above avg Vis: below avg by ~ 30% Vest: below avg by ~ 50% Pref: above avg Strategy analysis: WNL (hip dominant for conditions 5 and 4 x 2)       COG alignment: anterior        Laureen Abrahams, PT, DPT 02/07/17 4:15 PM                    [facility] Adult PT Treatment/Exercise - 01/31/17 1437      Self-Care   Self-Care Other Self-Care Comments   Other Self-Care Comments  results of SOT             Balance Exercises - 01/31/17 1437      Balance Exercises: Standing   Standing Eyes Opened Foam/compliant surface;Narrow base of support (BOS);Head turns  x10 each direction   Standing Eyes Closed Foam/compliant surface;Narrow base of support (BOS);5 reps;10 secs;Wide (BOA);Head turns  head turns x 10 each direction           PT Education - 01/31/17 1438    Education provided Yes   Education Details HEP   Person(s) Educated Patient   Methods Explanation;Demonstration;Handout   Comprehension Verbalized understanding;Returned demonstration;Need further instruction  PT Long Term Goals - 01/31/17 1438      PT LONG TERM GOAL #1   Title perform SOT with goal to be written as indicated (02/21/17)   Status Achieved     PT LONG TERM GOAL #2   Title independent with HEP as indicated (02/21/17)   Status On-going     PT LONG TERM GOAL #3   Title improve composite score to >/= 65% for improved balance and function (02/21/17)   Status New               Plan - 01/31/17 1439    Clinical Impression Statement Pt demonstrated decreased visual and vestibular input on sensory organization test with overall composite score below average.  Initiated HEP to address deficits today.  Will continue to benefit from PT to maximize function.   PT Treatment/Interventions ADLs/Self Care Home Management;Canalith Repostioning;Neuromuscular  re-education;Balance training;Therapeutic exercise;Therapeutic activities;Functional mobility training;Stair training;Gait training;Patient/family education;Vestibular   PT Next Visit Plan continue corner balance, progress dynamic gait and vestibular input   Consulted and Agree with Plan of Care Patient      Patient will benefit from skilled therapeutic intervention in order to improve the following deficits and impairments:  Dizziness  Visit Diagnosis: Dizziness and giddiness     Problem List Patient Active Problem List   Diagnosis Date Noted  . Essential hypertension, benign 07/30/2014  . Obesity, unspecified 07/30/2014  . Chest pain 04/10/2014  . Diabetes ([place]) 04/10/2014  . Dyslipidemia 04/10/2014  . 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 Coronary atherosclerosis of native coronary artery       Laureen Abrahams, PT, DPT 01/31/17 2:41 PM    [facility] [address] [place], [place], [address] Phone: [phone]   Fax:  [phone]  Name: Bryan Pratt MRN: [id number] Date of Birth: 1941/05/09

## 2017-02-07 ENCOUNTER — Ambulatory Visit: Payer: Medicare Other | Attending: Neurology | Admitting: Physical Therapy

## 2017-02-07 ENCOUNTER — Encounter: Payer: Self-pay | Admitting: Physical Therapy

## 2017-02-07 VITALS — BP 115/79 | HR 86

## 2017-02-07 DIAGNOSIS — R42 Dizziness and giddiness: Secondary | ICD-10-CM | POA: Insufficient documentation

## 2017-02-07 NOTE — Therapy (Addendum)
[facility] [address] [place], [place], [address] Phone: [phone]   Fax:  [phone]  Physical Therapy Treatment  Patient Details  Name: Bryan Pratt MRN: [id number] Date of Birth: [birth year] Referring Provider: Dr. Sarina Ill  Encounter Date: 02/07/2017      PT End of Session - 02/07/17 1425    Visit Number 3   Number of Visits 4   Date for PT Re-Evaluation 02/21/17   PT Start Time 1401   PT Stop Time 1419   PT Time Calculation (min) 18 min   Activity Tolerance Patient tolerated treatment well;Treatment limited secondary to medical complications (Comment)  see self care   Behavior During Therapy [facility] for tasks assessed/performed      Past Medical History:  Diagnosis Date  . Atrial fibrillation ([place])   . CAD (coronary artery disease)    s/p cath, occluded LAD with collateral fillling, 2008, last cath 03/03/2009 with CTO of the LAD multivessell.  . Coronary atherosclerosis of native coronary artery   . Diabetes ([place])   . Hyperlipidemia   . Hypertension   . Macular degeneration   . Obesity   . Rotator cuff tear, left     Past Surgical History:  Procedure Laterality Date  . CATARACT EXTRACTION, BILATERAL  2017  . CORONARY ARTERY BYPASS GRAFT  2014  . CYST EXCISION     Cheek  . KNEE SURGERY    . MAZE  2014   For a fib  . TONSILLECTOMY      Vitals:   02/07/17 1407 02/07/17 1414  BP: 120/69 115/79  Pulse: 86   SpO2: 94%         Subjective Assessment - 02/07/17 1402    Subjective feels like he's getting worse.  feeling more unsteady and "wobbly."  reports "it's on the edge of dizzy."   Pertinent History a fib, CAD, DM, HTN, HLD, macular degeneration, recent removal of cataracts   Limitations Standing;Walking;House hold activities   Patient Stated Goals improve dizziness   Currently in Pain? Yes   Pain Score 3    Pain Location Head   Pain Orientation Left   Pain Descriptors /  Indicators Headache;Sharp   Pain Type Chronic pain   Pain Onset More than a month ago   Pain Frequency Intermittent   Aggravating Factors  unknown, worse in mornings   Pain Relieving Factors n/a                         [facility] Adult PT Treatment/Exercise - 02/07/17 1420      Self-Care   Self-Care Other Self-Care Comments   Other Self-Care Comments  discussed current symptoms and risk factors.  Recommend pt follow up with MD to rule out more serious etiology of symptoms.  Pt in agreement and plans to schedule appt with PCP.   Pt reports L sided headaches, intermittent jaw pain and some LUE pain (mostly asymptomatic today).  Reviewed risk factors and warning signs, recommended to call 9-1-1 if symptoms develop and pt in agreement.                PT Education - 02/07/17 1425    Education provided Yes   Education Details see self care   Person(s) Educated Patient   Methods Explanation   Comprehension Verbalized understanding             PT Long Term Goals - 01/31/17 1438  PT LONG TERM GOAL #1   Title perform SOT with goal to be written as indicated (02/21/17)   Status Achieved     PT LONG TERM GOAL #2   Title independent with HEP as indicated (02/21/17)   Status On-going     PT LONG TERM GOAL #3   Title improve composite score to >/= 65% for improved balance and function (02/21/17)   Status New               Plan - 02/07/17 1425    Clinical Impression Statement Pt presents today with c/o increased insteadiness and concerns that symptoms are getting worse.  Pt reports after doing exercises feels worse and feels balance is worse.  Educated this could be due to stimulating system, but pt also reporting L sided headaches, intermittent L jaw pain and LUE pain (unsure if cardiac or due to partial RTC tear).  Vitals stable today in clinic but recommended pt follow up with MD due to new onset of these symtpoms.  Pt in agreement.  Will hold PT until  after pt sees MD.   PT Treatment/Interventions ADLs/Self Care Home Management;Canalith Repostioning;Neuromuscular re-education;Balance training;Therapeutic exercise;Therapeutic activities;Functional mobility training;Stair training;Gait training;Patient/family education;Vestibular   PT Next Visit Plan hold PT until after pt sees MD, if cleared continue balance and dynamic gait   Consulted and Agree with Plan of Care Patient      Patient will benefit from skilled therapeutic intervention in order to improve the following deficits and impairments:  Dizziness  Visit Diagnosis: Dizziness and giddiness     Problem List Patient Active Problem List   Diagnosis Date Noted  . Essential hypertension, benign 07/30/2014  . Obesity, unspecified 07/30/2014  . Chest pain 04/10/2014  . Diabetes ([place]) 04/10/2014  . Dyslipidemia 04/10/2014  . 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 Coronary atherosclerosis of native coronary artery       Laureen Abrahams, PT, DPT 02/07/17 2:29 PM    [facility] [address] [place], [place], [address] Phone: [phone]   Fax:  [phone]  Name: Bryan Pratt MRN: [id number] Date of Birth: 1941-01-23      PHYSICAL THERAPY DISCHARGE SUMMARY  Visits from Start of Care: 3  Current functional level related to goals / functional outcomes: See above; PT held due to worsening of symtpoms   Remaining deficits: unknown   Education / Equipment: HEP  Plan: Patient agrees to discharge.  Patient goals were not met. Patient is being discharged due to a change in medical status.  ?????  PT held due to worsening symptoms and recommended pt return to MD.    Laureen Abrahams, PT, DPT 05/23/17 8:25 AM  [facility] [address] [place], [place] [address]  [phone] (office) [phone] (fax)

## 2017-02-17 DIAGNOSIS — R42 Dizziness and giddiness: Secondary | ICD-10-CM | POA: Diagnosis not present

## 2017-02-17 DIAGNOSIS — Z7984 Long term (current) use of oral hypoglycemic drugs: Secondary | ICD-10-CM | POA: Diagnosis not present

## 2017-02-17 DIAGNOSIS — E1165 Type 2 diabetes mellitus with hyperglycemia: Secondary | ICD-10-CM | POA: Diagnosis not present

## 2017-02-17 DIAGNOSIS — E7800 Pure hypercholesterolemia, unspecified: Secondary | ICD-10-CM | POA: Diagnosis not present

## 2017-02-17 DIAGNOSIS — I10 Essential (primary) hypertension: Secondary | ICD-10-CM | POA: Diagnosis not present

## 2017-02-21 ENCOUNTER — Ambulatory Visit
Admission: RE | Admit: 2017-02-21 | Discharge: 2017-02-21 | Disposition: A | Discharge status: Home / Self Care | Payer: Medicare Other | Source: Ambulatory Visit | Attending: Neurology | Admitting: Neurology

## 2017-02-21 DIAGNOSIS — G458 Other transient cerebral ischemic attacks and related syndromes: Secondary | ICD-10-CM

## 2017-02-21 DIAGNOSIS — R42 Dizziness and giddiness: Secondary | ICD-10-CM

## 2017-02-21 DIAGNOSIS — R55 Syncope and collapse: Secondary | ICD-10-CM

## 2017-02-21 DIAGNOSIS — I639 Cerebral infarction, unspecified: Secondary | ICD-10-CM

## 2017-02-21 DIAGNOSIS — I6523 Occlusion and stenosis of bilateral carotid arteries: Secondary | ICD-10-CM | POA: Diagnosis not present

## 2017-02-21 MED ORDER — IOPAMIDOL (ISOVUE-370) INJECTION 76%
75.0000 mL | Freq: Once | INTRAVENOUS | Status: AC | PRN
  Administered 2017-02-21: 75 mL via INTRAVENOUS

## 2017-02-22 DIAGNOSIS — M65331 Trigger finger, right middle finger: Secondary | ICD-10-CM | POA: Diagnosis not present

## 2017-02-22 DIAGNOSIS — M65341 Trigger finger, right ring finger: Secondary | ICD-10-CM | POA: Diagnosis not present

## 2017-02-22 DIAGNOSIS — H43813 Vitreous degeneration, bilateral: Secondary | ICD-10-CM | POA: Diagnosis not present

## 2017-02-22 DIAGNOSIS — H35431 Paving stone degeneration of retina, right eye: Secondary | ICD-10-CM | POA: Diagnosis not present

## 2017-02-22 DIAGNOSIS — M65342 Trigger finger, left ring finger: Secondary | ICD-10-CM | POA: Diagnosis not present

## 2017-02-22 DIAGNOSIS — E119 Type 2 diabetes mellitus without complications: Secondary | ICD-10-CM | POA: Diagnosis not present

## 2017-02-22 DIAGNOSIS — H353212 Exudative age-related macular degeneration, right eye, with inactive choroidal neovascularization: Secondary | ICD-10-CM | POA: Diagnosis not present

## 2017-02-22 DIAGNOSIS — M65332 Trigger finger, left middle finger: Secondary | ICD-10-CM | POA: Diagnosis not present

## 2017-02-24 ENCOUNTER — Telehealth: Payer: Self-pay

## 2017-02-24 NOTE — Telephone Encounter (Signed)
Called pt w/ results. Reports that he is still extremely off-balance and would like to determine a cause for his symptoms. He does have extensive cardiac hx (cath, open heart, bypass surg), hyperlipidemia and diabetes. Did try a few sessions of vestibular therapy but was unable to tolerate. He has also been evaluated by ENT and was referred to [facility] by Dr. Ernesto Rutherford. Says that he does not want to take any meds for his symptoms, stating that he is on too many already. Follow-up appt scheduled for 4/25 @ 11:30 am.

## 2017-02-24 NOTE — Telephone Encounter (Signed)
-----   Message from Melvenia Beam, MD sent at 02/23/2017 10:13 AM EDT ----- Negative CTA for large vessel occlusion. No high-grade or correctable stenosis identified. No etiology for his vertigo. He does however have mild to moderate atheromatous disease due to his history of multiple vascular risk factors (diabetes, cholesterol, HTN) but nothing needing intervention the best thing to do is control his risk factors. thanks

## 2017-02-28 DIAGNOSIS — J0100 Acute maxillary sinusitis, unspecified: Secondary | ICD-10-CM | POA: Diagnosis not present

## 2017-03-23 DIAGNOSIS — M1711 Unilateral primary osteoarthritis, right knee: Secondary | ICD-10-CM | POA: Diagnosis not present

## 2017-03-30 ENCOUNTER — Encounter: Payer: Self-pay | Admitting: Neurology

## 2017-03-30 ENCOUNTER — Ambulatory Visit (INDEPENDENT_AMBULATORY_CARE_PROVIDER_SITE_OTHER): Payer: Medicare Other | Admitting: Neurology

## 2017-03-30 VITALS — BP 113/72 | HR 102 | Ht 70.0 in | Wt 228.4 lb

## 2017-03-30 DIAGNOSIS — R42 Dizziness and giddiness: Secondary | ICD-10-CM | POA: Diagnosis not present

## 2017-03-30 DIAGNOSIS — I639 Cerebral infarction, unspecified: Secondary | ICD-10-CM

## 2017-03-30 NOTE — Patient Instructions (Signed)
Remember to drink plenty of fluid, eat healthy meals and do not skip any meals. Try to eat protein with a every meal and eat a healthy snack such as fruit or nuts in between meals. Try to keep a regular sleep-wake schedule and try to exercise daily, particularly in the form of walking, 20-30 minutes a day, if you can.   As far as your medications are concerned, I would like to suggest: Try vestibular therapy again  I would like to see you back as needed, sooner if we need to. Please call us with any interim questions, concerns, problems, updates or refill requests.   Our phone number is [phone]. We also have an after hours call service for urgent matters and there is a physician on-call for urgent questions. For any emergencies you know to call 911 or go to the nearest emergency room

## 2017-03-30 NOTE — Progress Notes (Signed)
[facility]  Provider:  Dr Jaynee Eagles Referring Provider: Minna Merritts, MD Primary Care Physician:  [name], MD  CC:  vertigo  Interval history 03/30/2017: MRI of the brain was normal. CTA of the head and neck without etiology. Patient failed vestibular rehab. Patient continues to be "unstable/equilibrium problem".  Vestibular therapy made things worse. Unfortunately no neurologic cause found his symptoms. No history of migraines.  He has vertigo attacks. Last one was was 3 months ago.   1. Negative CTA for large vessel occlusion. No high-grade or correctable stenosis identified. 2. Atheromatous plaque within the V4 segments bilaterally with mild to moderate narrowing on the left, and mild narrowing on the right. Left vertebral artery dominant. Vertebrobasilar system otherwise widely patent. 3. Atheromatous plaque within the cavernous ICAs bilaterally with mild multifocal narrowing.  HPI:  Bryan Pratt is a 76 y.o. male here as a referral from Dr. Kenton Kingfisher for vertigo and tinnitus in both ears.Past medical history cardiac catheterization, open heart surgery and bypass, hyperlipidemia, diabetes, hemorrhoids. He started having vertigo symptoms 35 years ago and was diagnosed with Meniere's disease. Occ vertigo over the years. A few months ago he had several bad attacks, had to hold onto the table to stop from falling, severe vertigo. He was evaluated by Dr. Ernesto Rutherford who disagreed with the Meniere's disease. The worst episode lasted 60 - 120 seconds, severe, room spinning, closing his eyes and sitting still helps. Last episode 3 weeks ago. Unknown triggers. If he turns abruptly any way he can get a feeling like his equilibrium is impaired and has to grab something however vertigo can happen without sudden movements . Some slight nausea but no vomiting. He has Tinnitus starting about 3 months ago. Dizziness, vertigo and syncope. No other focal neurologic deficits, associated  symptoms, inciting events or modifiable factors.  Reviewed notes, labs and imaging from outside physicians, which showed:  Reviewed and hasn't throat Dr. Jeneen Rinks Crossley's notes. Patient was seen for a chief complaint of vertigo and tinnitus in both ears. He had a heart bypass in the past. He also has arthritis and diabetes. He was told he had Mnire's disease in the past has had some vertigo spells where he was concerned about his balance and following some of this is orthostatic hypotension. He is getting up quickly and getting just a lightheaded feeling. ENT examination showed tympanic membranes look excellent and moved well, audiogram is also excellent at 100% discrimination on both ears, high-frequency mild loss, normal impedance study. Nose and throat were completely clear. Ears were clear after removing cerumen. He has a market septal deviation. Neck is normal. Larynx is normal. Mouth exam is normal. Exam was completely normal. Neurology was consulted for other causes of vertigo and concerns for falls.  Personally reviewed images and agree with the following: MRI brain 06/2016:  Mild diffuse prominence of the CSF containing spaces is compatible with generalized cerebral atrophy, within normal limits for patient age. Minimal patchy and confluent T2/FLAIR hyperintensity present within the periventricular and deep white matter both cerebral hemispheres, nonspecific, but most like related to chronic small vessel ischemic disease, and felt to be within normal limits for patient age.  No abnormal foci of restricted diffusion to suggest acute or subacute infarction. Gray-white matter differentiation is well maintained. Major intracranial vascular flow voids are well preserved. No acute or chronic intracranial hemorrhage. No areas of chronic infarction.  No mass lesion, midline shift, or mass effect. No hydrocephalus. Major dural sinuses are grossly patent. No extra-axial  fluid collection.  Craniocervical junction within normal limits. Mild degenerative disc bulging noted at C2-3 and C3-4 without significant stenosis. Remainder the visualized upper cervical spine otherwise unremarkable.  Pituitary gland within normal limits. No acute abnormality about the globes and orbits.  Minimal mucosal thickening within the inferior maxillary sinuses. Paranasal sinuses are otherwise clear. No mastoid effusion. Inner ear structures grossly unremarkable.  Bone marrow signal intensity within normal limits. No scalp soft tissue abnormality.  IMPRESSION: Normal brain MRI for patient age.  Review of Systems: Patient complains of symptoms per HPI as well as the following symptoms: no CP, no SOB. Pertinent negatives per HPI. All others negative.   Social History   Social History  . Marital status: Widowed    Spouse name: N/A  . Number of children: 3  . Years of education: [place]   Occupational History  . Retired    Social History Main Topics  . Smoking status: Never Smoker  . Smokeless tobacco: Never Used  . Alcohol use No  . Drug use: No  . Sexual activity: Not on file   Other Topics Concern  . Not on file   Social History Narrative   Lives at home w/ his son   Right-handed   Caffeine: at least  3 cups coffee per day    Family History  Problem Relation Age of Onset  . CAD Father   . Heart attack Father   . Hypertension Father   . Stroke Father   . Cancer Mother   . Diabetes type II Sister   . Heart disease Brother     Past Medical History:  Diagnosis Date  . Atrial fibrillation ([place])   . CAD (coronary artery disease)    s/p cath, occluded LAD with collateral fillling, 2008, last cath 03/03/2009 with CTO of the LAD multivessell.  . Coronary atherosclerosis of native coronary artery   . Diabetes ([place])   . Hyperlipidemia   . Hypertension   . Macular degeneration   . Obesity   . Rotator cuff tear, left     Past Surgical  History:  Procedure Laterality Date  . CATARACT EXTRACTION, BILATERAL  2017  . CORONARY ARTERY BYPASS GRAFT  2014  . CYST EXCISION     Cheek  . KNEE SURGERY    . MAZE  2014   For a fib  . TONSILLECTOMY      Current Outpatient Prescriptions  Medication Sig Dispense Refill  . Ascorbic Acid (VITAMIN C) 100 MG tablet Take 100 mg by mouth daily.    Marland Kitchen aspirin EC 81 MG tablet Take 1 tablet (81 mg total) by mouth daily. 90 tablet 3  . atorvastatin (LIPITOR) 40 MG tablet Take 40 mg by mouth daily.    . beta carotene w/minerals (OCUVITE) tablet Take 1 tablet by mouth daily.    . cholecalciferol (VITAMIN D) 1000 units tablet Take 1,000 Units by mouth daily.    . Cyanocobalamin (VITAMIN B 12 PO) Take by mouth daily.    Mariane Baumgarten Calcium (STOOL SOFTENER PO) Take 100 mg by mouth daily.    . furosemide (LASIX) 40 MG tablet Take 1 tablet (40 mg total) by mouth daily. 90 tablet 2  . lisinopril (PRINIVIL,ZESTRIL) 10 MG tablet Take 10 mg by mouth daily.     . metFORMIN (GLUCOPHAGE) 500 MG tablet Take by mouth daily.    . Multiple Vitamins-Minerals (MULTIVITAMIN PO) Take by mouth daily.    . nitroGLYCERIN (NITROSTAT) 0.4 MG SL tablet Place  1 tablet (0.4 mg total) under the tongue every 5 (five) minutes as needed for chest pain. (Patient not taking: Reported on 01/13/2017) 25 tablet 11  . ranitidine (ZANTAC) 300 MG tablet Take 300 mg by mouth daily.     Marland Kitchen tobramycin (TOBREX) 0.3 % ophthalmic solution Place 1 drop into the right eye 2 (two) times daily. Instructed to take eye drop 2 days before and 1 day after eye treatment.    . traMADol (ULTRAM) 50 MG tablet Take 50 mg by mouth every 6 (six) hours as needed for moderate pain.     Marland Kitchen VITAMIN E PO Take 650 Units by mouth daily.     No current facility-administered medications for this visit.     Allergies as of 03/30/2017 - Review Complete 02/07/2017  Allergen Reaction Noted  . Morphine Other (See Comments) 11/07/2015  . Morphine and related Nausea And  Vomiting 03/07/2013  . Codeine Nausea And Vomiting 03/07/2013  . Other Other (See Comments) 11/07/2015  . Tape Other (See Comments) 11/07/2015    Vitals: BP 120/78   Pulse (!) 103   Ht 5\' 10"  (1.778 m)   Wt 228 lb 6.4 oz (103.6 kg)   BMI 32.77 kg/m  Last Weight:  Wt Readings from Last 1 Encounters:  03/30/17 228 lb 6.4 oz (103.6 kg)   Last Height:   Ht Readings from Last 1 Encounters:  03/30/17 5\' 10"  (1.778 m)    Neuro: Detailed Neurologic Exam  Speech:    Speech is normal; fluent and spontaneous with normal comprehension.  Cognition:    The patient is oriented to person, place, and time;     recent and remote memory intact;     language fluent;     normal attention, concentration,     fund of knowledge Cranial Nerves:    The pupils are equal, round, and reactive to light. The fundi are normal and spontaneous venous pulsations are present. Visual fields are full to finger confrontation. Extraocular movements are intact. Trigeminal sensation is intact and the muscles of mastication are normal. The face is symmetric. The palate elevates in the midline. Hearing intact. Voice is normal. Shoulder shrug is normal. The tongue has normal motion without fasciculations.   Coordination:    Normal finger to nose and heel to shin. Normal rapid alternating movements.   Gait:    Heel-toe and tandem gait are normal.   Motor Observation:    No asymmetry, no atrophy, and no involuntary movements noted. Tone:    Normal muscle tone.    Posture:    Posture is normal. normal erect    Strength:    Strength is V/V in the upper and lower limbs.      Sensation: intact to LT     Reflex Exam:  DTR's:    Deep tendon reflexes in the upper and lower extremities are normal bilaterally.   Toes:    The toes are downgoing bilaterally.   Clonus:    Clonus is absent.     Assessment/Plan:  76 year old with chronic disequilibrium and vertigo for 30 years, MRI of the brain normal,  ENT evaluation without etiology(Dxed with meniere's in the past, however), CTA of the head and neck without etiology, could not tolerate vestibular therapy. Continues to have disequilibrium daily and episodes of severe vertigo. Unfortunately nothing more to offer patient from Neurology, will refer back to pcp. F/u as needed. He stopped vestibular therapy after a few sessions, recommend return. No migraines or suggestion  of vestibular migraines.   Sarina Ill, MD  [facility] [address] [place], [place] [address]  Phone [phone] Fax [phone]  A total of 20 minutes was spent face-to-face with this patient. Over half this time was spent on counseling patient on the disequilibrium and vertigo diagnosis and different diagnostic and therapeutic options available.

## 2017-04-05 ENCOUNTER — Telehealth: Payer: Self-pay

## 2017-04-05 NOTE — Telephone Encounter (Signed)
Request for surgical clearance:  1. What type of surgery is being performed?   Left Ring Finger: Tenosynovectomy flexor tendon sheath radical palm/finger each tendon, Left Middle Finger: Tenosynovectomy flexor tendon sheath radical palm/finger each tendon   2. When is this surgery scheduled? 04/13/17   3. Are there any medications that need to be held prior to surgery and how long? Aspirin   4. Name of physician performing surgery? Dr. Caralyn Guile   5. What is your office phone and fax number? P- [phone], [phone]

## 2017-04-06 NOTE — Telephone Encounter (Signed)
Clearance faxed to [phone], Attn: Jeronimo Norma. Confirmation received that fax went through.

## 2017-04-06 NOTE — Telephone Encounter (Signed)
OK to hold aspirin 7 days prior to surgery.

## 2017-04-13 DIAGNOSIS — M65842 Other synovitis and tenosynovitis, left hand: Secondary | ICD-10-CM | POA: Diagnosis not present

## 2017-04-13 DIAGNOSIS — M65342 Trigger finger, left ring finger: Secondary | ICD-10-CM | POA: Diagnosis not present

## 2017-04-13 DIAGNOSIS — M65332 Trigger finger, left middle finger: Secondary | ICD-10-CM | POA: Diagnosis not present

## 2017-04-22 DIAGNOSIS — X32XXXD Exposure to sunlight, subsequent encounter: Secondary | ICD-10-CM | POA: Diagnosis not present

## 2017-04-22 DIAGNOSIS — L570 Actinic keratosis: Secondary | ICD-10-CM | POA: Diagnosis not present

## 2017-04-22 DIAGNOSIS — Z4789 Encounter for other orthopedic aftercare: Secondary | ICD-10-CM | POA: Diagnosis not present

## 2017-04-22 DIAGNOSIS — M65332 Trigger finger, left middle finger: Secondary | ICD-10-CM | POA: Diagnosis not present

## 2017-04-22 DIAGNOSIS — M65342 Trigger finger, left ring finger: Secondary | ICD-10-CM | POA: Diagnosis not present

## 2017-04-22 DIAGNOSIS — D225 Melanocytic nevi of trunk: Secondary | ICD-10-CM | POA: Diagnosis not present

## 2017-05-19 DIAGNOSIS — H26492 Other secondary cataract, left eye: Secondary | ICD-10-CM | POA: Diagnosis not present

## 2017-05-19 DIAGNOSIS — H353233 Exudative age-related macular degeneration, bilateral, with inactive scar: Secondary | ICD-10-CM | POA: Diagnosis not present

## 2017-05-19 DIAGNOSIS — H10413 Chronic giant papillary conjunctivitis, bilateral: Secondary | ICD-10-CM | POA: Diagnosis not present

## 2017-05-19 DIAGNOSIS — H04123 Dry eye syndrome of bilateral lacrimal glands: Secondary | ICD-10-CM | POA: Diagnosis not present

## 2017-05-20 DIAGNOSIS — M65341 Trigger finger, right ring finger: Secondary | ICD-10-CM | POA: Diagnosis not present

## 2017-05-20 DIAGNOSIS — Z4789 Encounter for other orthopedic aftercare: Secondary | ICD-10-CM | POA: Diagnosis not present

## 2017-05-20 DIAGNOSIS — M65331 Trigger finger, right middle finger: Secondary | ICD-10-CM | POA: Diagnosis not present

## 2017-05-23 DIAGNOSIS — Z7984 Long term (current) use of oral hypoglycemic drugs: Secondary | ICD-10-CM | POA: Diagnosis not present

## 2017-05-23 DIAGNOSIS — Z6833 Body mass index (BMI) 33.0-33.9, adult: Secondary | ICD-10-CM | POA: Diagnosis not present

## 2017-05-23 DIAGNOSIS — E7800 Pure hypercholesterolemia, unspecified: Secondary | ICD-10-CM | POA: Diagnosis not present

## 2017-05-23 DIAGNOSIS — E1165 Type 2 diabetes mellitus with hyperglycemia: Secondary | ICD-10-CM | POA: Diagnosis not present

## 2017-05-23 DIAGNOSIS — Z1211 Encounter for screening for malignant neoplasm of colon: Secondary | ICD-10-CM | POA: Diagnosis not present

## 2017-05-23 DIAGNOSIS — I10 Essential (primary) hypertension: Secondary | ICD-10-CM | POA: Diagnosis not present

## 2017-05-23 DIAGNOSIS — E6609 Other obesity due to excess calories: Secondary | ICD-10-CM | POA: Diagnosis not present

## 2017-06-17 DIAGNOSIS — H353123 Nonexudative age-related macular degeneration, left eye, advanced atrophic without subfoveal involvement: Secondary | ICD-10-CM | POA: Diagnosis not present

## 2017-06-17 DIAGNOSIS — H35431 Paving stone degeneration of retina, right eye: Secondary | ICD-10-CM | POA: Diagnosis not present

## 2017-06-17 DIAGNOSIS — E119 Type 2 diabetes mellitus without complications: Secondary | ICD-10-CM | POA: Diagnosis not present

## 2017-06-17 DIAGNOSIS — H353212 Exudative age-related macular degeneration, right eye, with inactive choroidal neovascularization: Secondary | ICD-10-CM | POA: Diagnosis not present

## 2017-06-20 DIAGNOSIS — M65841 Other synovitis and tenosynovitis, right hand: Secondary | ICD-10-CM | POA: Diagnosis not present

## 2017-06-20 DIAGNOSIS — M65332 Trigger finger, left middle finger: Secondary | ICD-10-CM | POA: Diagnosis not present

## 2017-06-20 DIAGNOSIS — M65331 Trigger finger, right middle finger: Secondary | ICD-10-CM | POA: Diagnosis not present

## 2017-07-05 DIAGNOSIS — M65331 Trigger finger, right middle finger: Secondary | ICD-10-CM | POA: Diagnosis not present

## 2017-07-05 DIAGNOSIS — M65341 Trigger finger, right ring finger: Secondary | ICD-10-CM | POA: Diagnosis not present

## 2017-08-02 DIAGNOSIS — M65341 Trigger finger, right ring finger: Secondary | ICD-10-CM | POA: Diagnosis not present

## 2017-08-02 DIAGNOSIS — M65331 Trigger finger, right middle finger: Secondary | ICD-10-CM | POA: Diagnosis not present

## 2017-08-10 DIAGNOSIS — H353123 Nonexudative age-related macular degeneration, left eye, advanced atrophic without subfoveal involvement: Secondary | ICD-10-CM | POA: Diagnosis not present

## 2017-08-10 DIAGNOSIS — E119 Type 2 diabetes mellitus without complications: Secondary | ICD-10-CM | POA: Diagnosis not present

## 2017-08-10 DIAGNOSIS — H35431 Paving stone degeneration of retina, right eye: Secondary | ICD-10-CM | POA: Diagnosis not present

## 2017-08-10 DIAGNOSIS — H353213 Exudative age-related macular degeneration, right eye, with inactive scar: Secondary | ICD-10-CM | POA: Diagnosis not present

## 2017-08-17 DIAGNOSIS — X32XXXD Exposure to sunlight, subsequent encounter: Secondary | ICD-10-CM | POA: Diagnosis not present

## 2017-08-17 DIAGNOSIS — C4441 Basal cell carcinoma of skin of scalp and neck: Secondary | ICD-10-CM | POA: Diagnosis not present

## 2017-08-17 DIAGNOSIS — L218 Other seborrheic dermatitis: Secondary | ICD-10-CM | POA: Diagnosis not present

## 2017-08-17 DIAGNOSIS — L570 Actinic keratosis: Secondary | ICD-10-CM | POA: Diagnosis not present

## 2017-08-25 DIAGNOSIS — H10413 Chronic giant papillary conjunctivitis, bilateral: Secondary | ICD-10-CM | POA: Diagnosis not present

## 2017-08-25 DIAGNOSIS — H353122 Nonexudative age-related macular degeneration, left eye, intermediate dry stage: Secondary | ICD-10-CM | POA: Diagnosis not present

## 2017-08-25 DIAGNOSIS — H353213 Exudative age-related macular degeneration, right eye, with inactive scar: Secondary | ICD-10-CM | POA: Diagnosis not present

## 2017-08-25 DIAGNOSIS — H524 Presbyopia: Secondary | ICD-10-CM | POA: Diagnosis not present

## 2017-08-25 DIAGNOSIS — H26492 Other secondary cataract, left eye: Secondary | ICD-10-CM | POA: Diagnosis not present

## 2017-08-26 DIAGNOSIS — I10 Essential (primary) hypertension: Secondary | ICD-10-CM | POA: Diagnosis not present

## 2017-08-26 DIAGNOSIS — I2510 Atherosclerotic heart disease of native coronary artery without angina pectoris: Secondary | ICD-10-CM | POA: Diagnosis not present

## 2017-08-26 DIAGNOSIS — E7800 Pure hypercholesterolemia, unspecified: Secondary | ICD-10-CM | POA: Diagnosis not present

## 2017-08-26 DIAGNOSIS — E1165 Type 2 diabetes mellitus with hyperglycemia: Secondary | ICD-10-CM | POA: Diagnosis not present

## 2017-09-28 DIAGNOSIS — Z85828 Personal history of other malignant neoplasm of skin: Secondary | ICD-10-CM | POA: Diagnosis not present

## 2017-09-28 DIAGNOSIS — L218 Other seborrheic dermatitis: Secondary | ICD-10-CM | POA: Diagnosis not present

## 2017-09-28 DIAGNOSIS — Z08 Encounter for follow-up examination after completed treatment for malignant neoplasm: Secondary | ICD-10-CM | POA: Diagnosis not present

## 2017-10-18 DIAGNOSIS — Z23 Encounter for immunization: Secondary | ICD-10-CM | POA: Diagnosis not present

## 2017-10-19 DIAGNOSIS — M1711 Unilateral primary osteoarthritis, right knee: Secondary | ICD-10-CM | POA: Diagnosis not present

## 2017-11-24 DIAGNOSIS — H353213 Exudative age-related macular degeneration, right eye, with inactive scar: Secondary | ICD-10-CM | POA: Diagnosis not present

## 2017-11-24 DIAGNOSIS — H524 Presbyopia: Secondary | ICD-10-CM | POA: Diagnosis not present

## 2017-11-24 DIAGNOSIS — H353122 Nonexudative age-related macular degeneration, left eye, intermediate dry stage: Secondary | ICD-10-CM | POA: Diagnosis not present

## 2017-11-24 DIAGNOSIS — H26492 Other secondary cataract, left eye: Secondary | ICD-10-CM | POA: Diagnosis not present

## 2017-11-24 DIAGNOSIS — H10413 Chronic giant papillary conjunctivitis, bilateral: Secondary | ICD-10-CM | POA: Diagnosis not present

## 2017-12-15 DIAGNOSIS — Z1211 Encounter for screening for malignant neoplasm of colon: Secondary | ICD-10-CM | POA: Diagnosis not present

## 2017-12-15 IMAGING — MR MR HEAD W/O CM
10 series · 48 of 48 positions shown · non-contrast
Comparison: None.

CLINICAL DATA: Initial evaluation for 2-3 weeks of headaches,
severe dizziness, nausea, gait disorder. History of prior bypass and
ablation for arrhythmia.

EXAM:
MRI HEAD WITHOUT CONTRAST
TECHNIQUE: Multiplanar, multiecho pulse sequences of the brain and surrounding
structures were obtained without intravenous contrast.

[Series 5: T1 · sagittal · 4.0mm · 0.75mm/px · 4 of 31 slices shown (1 of 2)]
[im 1/31]
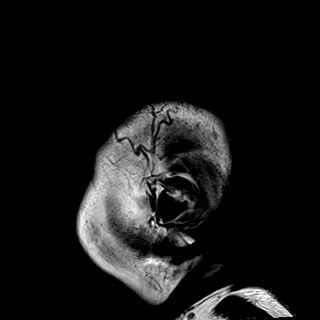
[im 11/31]
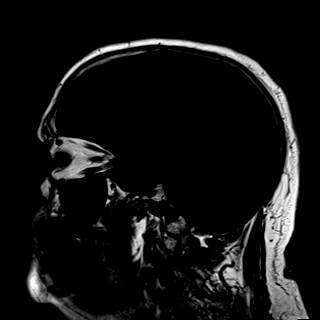
[im 21/31]
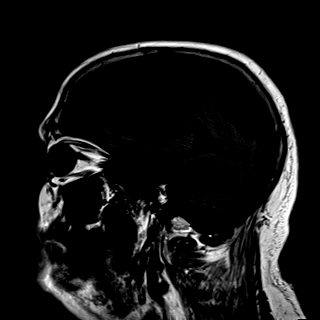
[im 31/31]
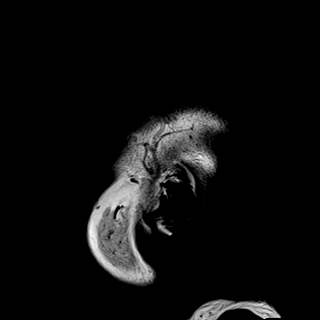

[Series 6: DWI · axial · 3.0mm · 1.44mm/px · z∈[-23,+105]mm · 8 of 82 slices shown (1 of 4)]
[im 1/82]
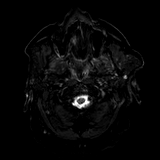
[im 12/82]
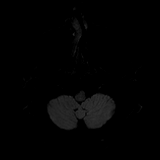
[im 24/82]
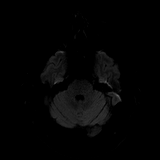
[im 35/82]
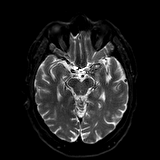
[im 47/82]
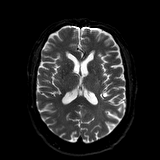
[im 58/82]
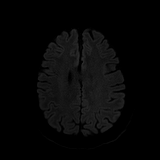
[im 70/82]
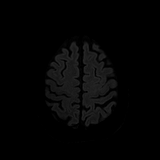
[im 82/82]
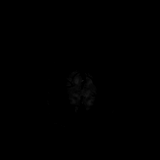

[Series 7: DWI · axial · 3.0mm · 1.44mm/px · z∈[-23,+105]mm · 3 of 40 slices shown (2 of 4)]
[im 1/40]
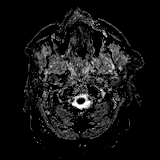
[im 20/40]
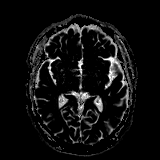
[im 40/40]
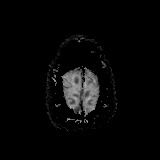

[Series 8: DWI · coronal · 5.0mm · 1.44mm/px · 5 of 58 slices shown (3 of 4)]
[im 1/58]
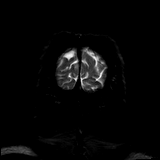
[im 15/58]
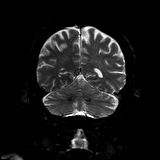
[im 29/58]
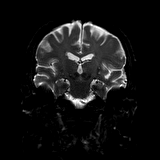
[im 43/58]
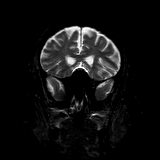
[im 58/58]
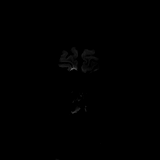

[Series 9: DWI · coronal · 5.0mm · 1.44mm/px · 2 of 29 slices shown (4 of 4)]
[im 1/29]
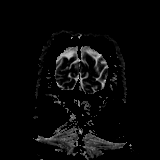
[im 29/29]
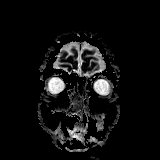

[Series 10: T2 · axial · 4.0mm · 0.36mm/px · z∈[-13,+116]mm · 2 of 27 slices shown (1 of 2)]
[im 1/27]
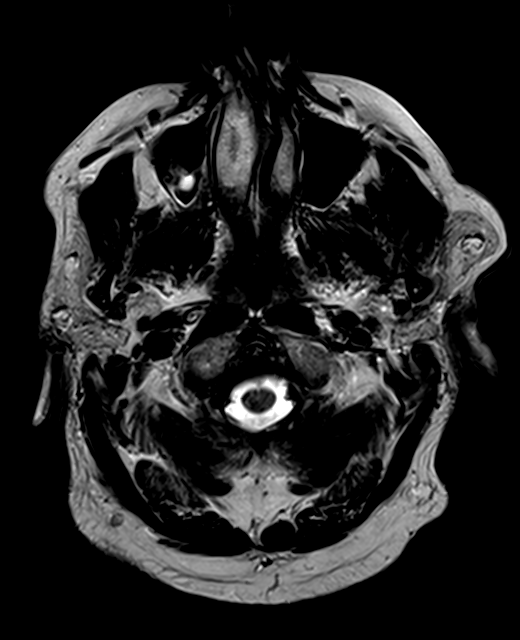
[im 27/27]
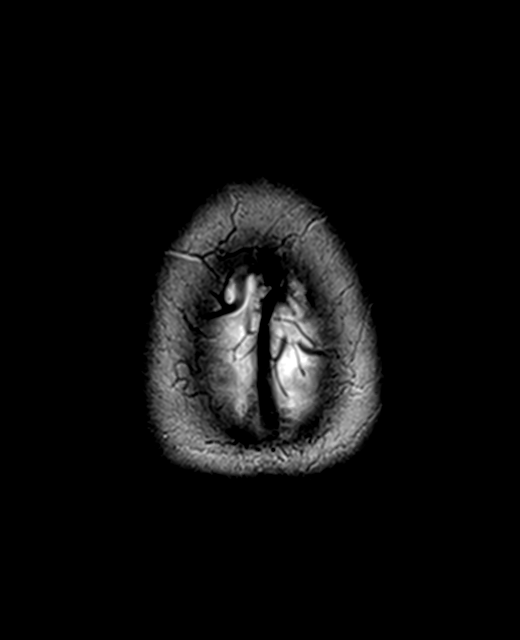

[Series 11: FLAIR · axial · 4.0mm · 0.72mm/px · z∈[-14,+115]mm · 2 of 27 slices shown]
[im 1/27]
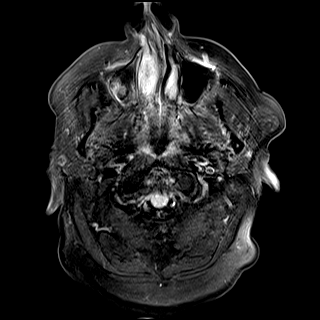
[im 27/27]
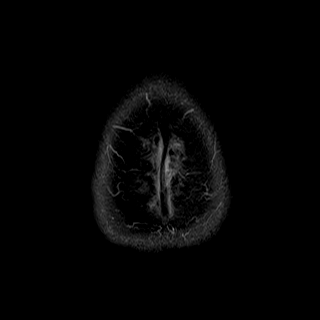

[Series 13: swi_images · axial · 1.5mm · 0.90mm/px · z∈[-16,+120]mm · 8 of 96 slices shown]
[im 1/96]
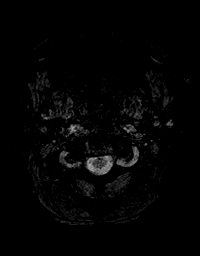
[im 14/96]
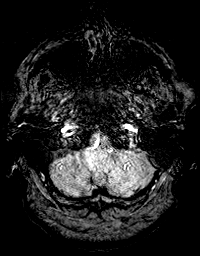
[im 28/96]
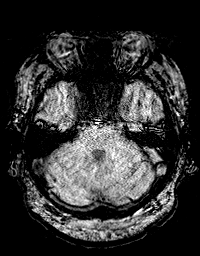
[im 41/96]
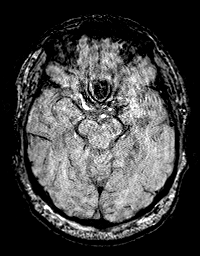
[im 55/96]
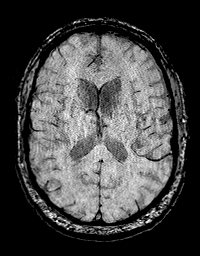
[im 68/96]
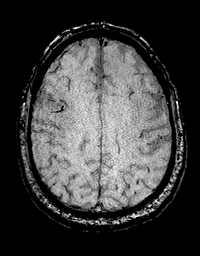
[im 82/96]
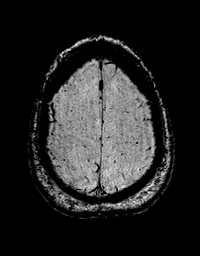
[im 96/96]
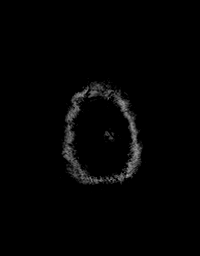

[Series 14: T1 · axial · 1.0mm · 0.90mm/px · z∈[-16,+120]mm · 12 of 144 slices shown (2 of 2)]
[im 1/144]
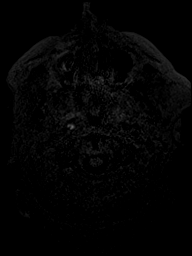
[im 14/144]
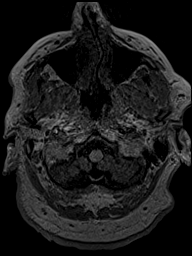
[im 27/144]
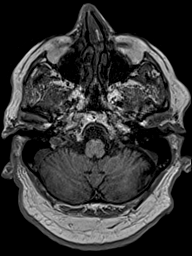
[im 40/144]
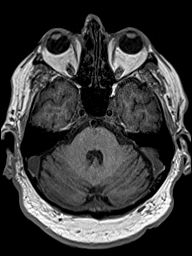
[im 53/144]
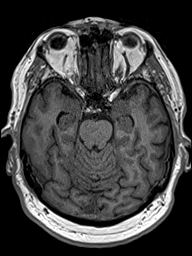
[im 66/144]
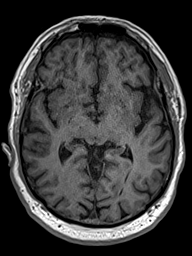
[im 79/144]
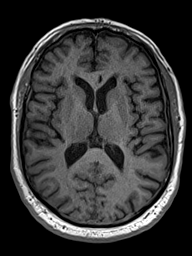
[im 92/144]
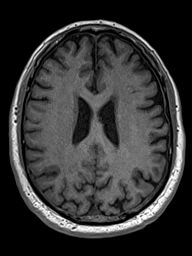
[im 105/144]
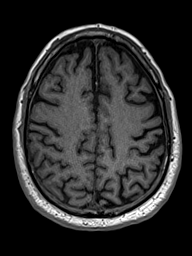
[im 118/144]
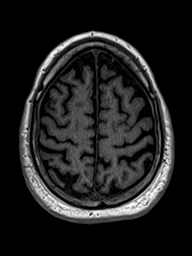
[im 131/144]
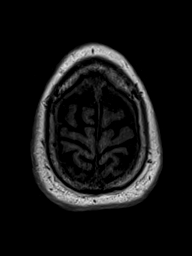
[im 144/144]
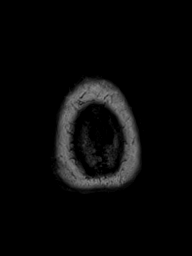

[Series 15: T2 · coronal · 4.5mm · 0.36mm/px · 2 of 27 slices shown (2 of 2)]
[im 1/27]
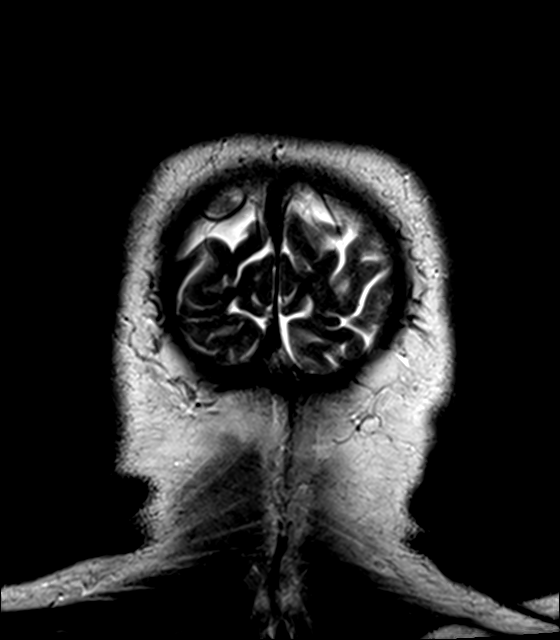
[im 27/27]
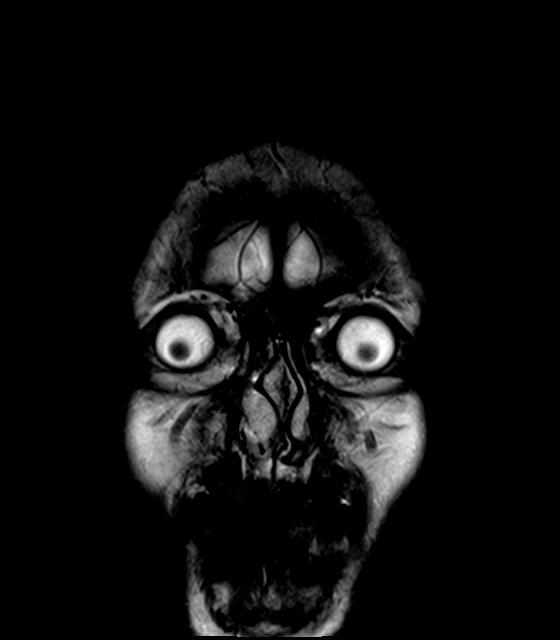

[48 of 48 positions shown; findings below may reference images not displayed]

FINDINGS: Mild diffuse prominence of the CSF containing spaces is compatible
with generalized cerebral atrophy, within normal limits for patient
age. Minimal patchy and confluent T2/FLAIR hyperintensity present
within the periventricular and deep white matter both cerebral
hemispheres, nonspecific, but most like related to chronic small
vessel ischemic disease, and felt to be within normal limits for
patient age.

No abnormal foci of restricted diffusion to suggest acute or
subacute infarction. Gray-white matter differentiation is well
maintained. Major intracranial vascular flow voids are well
preserved. No acute or chronic intracranial hemorrhage. No areas of
chronic infarction.

No mass lesion, midline shift, or mass effect. No hydrocephalus.
Major dural sinuses are grossly patent. No extra-axial fluid
collection.

Craniocervical junction within normal limits. Mild degenerative disc
bulging noted at C2-3 and C3-4 without significant stenosis.
Remainder the visualized upper cervical spine otherwise
unremarkable.

Pituitary gland within normal limits. No acute abnormality about the
globes and orbits.

Minimal mucosal thickening within the inferior maxillary sinuses.
Paranasal sinuses are otherwise clear. No mastoid effusion. Inner
ear structures grossly unremarkable.

Bone marrow signal intensity within normal limits. No scalp soft
tissue abnormality.
IMPRESSION: Normal brain MRI for patient age.

## 2017-12-21 DIAGNOSIS — H9311 Tinnitus, right ear: Secondary | ICD-10-CM | POA: Diagnosis not present

## 2017-12-21 DIAGNOSIS — H6121 Impacted cerumen, right ear: Secondary | ICD-10-CM | POA: Diagnosis not present

## 2017-12-21 DIAGNOSIS — H9041 Sensorineural hearing loss, unilateral, right ear, with unrestricted hearing on the contralateral side: Secondary | ICD-10-CM | POA: Diagnosis not present

## 2018-01-27 DIAGNOSIS — H353221 Exudative age-related macular degeneration, left eye, with active choroidal neovascularization: Secondary | ICD-10-CM | POA: Diagnosis not present

## 2018-01-27 DIAGNOSIS — H353213 Exudative age-related macular degeneration, right eye, with inactive scar: Secondary | ICD-10-CM | POA: Diagnosis not present

## 2018-01-27 DIAGNOSIS — H43813 Vitreous degeneration, bilateral: Secondary | ICD-10-CM | POA: Diagnosis not present

## 2018-01-27 DIAGNOSIS — H35431 Paving stone degeneration of retina, right eye: Secondary | ICD-10-CM | POA: Diagnosis not present

## 2018-02-12 DIAGNOSIS — J019 Acute sinusitis, unspecified: Secondary | ICD-10-CM | POA: Diagnosis not present

## 2018-02-12 DIAGNOSIS — J209 Acute bronchitis, unspecified: Secondary | ICD-10-CM | POA: Diagnosis not present

## 2018-02-12 DIAGNOSIS — J111 Influenza due to unidentified influenza virus with other respiratory manifestations: Secondary | ICD-10-CM | POA: Diagnosis not present

## 2018-02-27 DIAGNOSIS — E1165 Type 2 diabetes mellitus with hyperglycemia: Secondary | ICD-10-CM | POA: Diagnosis not present

## 2018-02-27 DIAGNOSIS — I2510 Atherosclerotic heart disease of native coronary artery without angina pectoris: Secondary | ICD-10-CM | POA: Diagnosis not present

## 2018-02-27 DIAGNOSIS — Z125 Encounter for screening for malignant neoplasm of prostate: Secondary | ICD-10-CM | POA: Diagnosis not present

## 2018-02-27 DIAGNOSIS — Z7984 Long term (current) use of oral hypoglycemic drugs: Secondary | ICD-10-CM | POA: Diagnosis not present

## 2018-02-27 DIAGNOSIS — E7800 Pure hypercholesterolemia, unspecified: Secondary | ICD-10-CM | POA: Diagnosis not present

## 2018-02-27 DIAGNOSIS — R0602 Shortness of breath: Secondary | ICD-10-CM | POA: Diagnosis not present

## 2018-02-27 DIAGNOSIS — Z1389 Encounter for screening for other disorder: Secondary | ICD-10-CM | POA: Diagnosis not present

## 2018-02-27 DIAGNOSIS — I10 Essential (primary) hypertension: Secondary | ICD-10-CM | POA: Diagnosis not present

## 2018-03-08 DIAGNOSIS — H353213 Exudative age-related macular degeneration, right eye, with inactive scar: Secondary | ICD-10-CM | POA: Diagnosis not present

## 2018-03-08 DIAGNOSIS — H353221 Exudative age-related macular degeneration, left eye, with active choroidal neovascularization: Secondary | ICD-10-CM | POA: Diagnosis not present

## 2018-03-08 DIAGNOSIS — H43813 Vitreous degeneration, bilateral: Secondary | ICD-10-CM | POA: Diagnosis not present

## 2018-04-05 DIAGNOSIS — H353221 Exudative age-related macular degeneration, left eye, with active choroidal neovascularization: Secondary | ICD-10-CM | POA: Diagnosis not present

## 2018-04-05 DIAGNOSIS — H353213 Exudative age-related macular degeneration, right eye, with inactive scar: Secondary | ICD-10-CM | POA: Diagnosis not present

## 2018-04-05 DIAGNOSIS — H35431 Paving stone degeneration of retina, right eye: Secondary | ICD-10-CM | POA: Diagnosis not present

## 2018-04-05 DIAGNOSIS — H43813 Vitreous degeneration, bilateral: Secondary | ICD-10-CM | POA: Diagnosis not present

## 2018-05-02 DIAGNOSIS — M1711 Unilateral primary osteoarthritis, right knee: Secondary | ICD-10-CM | POA: Diagnosis not present

## 2018-05-03 DIAGNOSIS — H43813 Vitreous degeneration, bilateral: Secondary | ICD-10-CM | POA: Diagnosis not present

## 2018-05-03 DIAGNOSIS — H353213 Exudative age-related macular degeneration, right eye, with inactive scar: Secondary | ICD-10-CM | POA: Diagnosis not present

## 2018-05-03 DIAGNOSIS — H35431 Paving stone degeneration of retina, right eye: Secondary | ICD-10-CM | POA: Diagnosis not present

## 2018-05-03 DIAGNOSIS — H353221 Exudative age-related macular degeneration, left eye, with active choroidal neovascularization: Secondary | ICD-10-CM | POA: Diagnosis not present

## 2018-05-25 DIAGNOSIS — H353213 Exudative age-related macular degeneration, right eye, with inactive scar: Secondary | ICD-10-CM | POA: Diagnosis not present

## 2018-05-25 DIAGNOSIS — Z961 Presence of intraocular lens: Secondary | ICD-10-CM | POA: Diagnosis not present

## 2018-05-25 DIAGNOSIS — H10413 Chronic giant papillary conjunctivitis, bilateral: Secondary | ICD-10-CM | POA: Diagnosis not present

## 2018-05-25 DIAGNOSIS — H353122 Nonexudative age-related macular degeneration, left eye, intermediate dry stage: Secondary | ICD-10-CM | POA: Diagnosis not present

## 2018-05-30 ENCOUNTER — Ambulatory Visit
Admission: RE | Admit: 2018-05-30 | Discharge: 2018-05-30 | Disposition: A | Discharge status: Home / Self Care | Payer: Medicare Other | Source: Ambulatory Visit | Attending: Family Medicine | Admitting: Family Medicine

## 2018-05-30 ENCOUNTER — Other Ambulatory Visit: Payer: Self-pay | Admitting: Family Medicine

## 2018-05-30 DIAGNOSIS — R140 Abdominal distension (gaseous): Secondary | ICD-10-CM | POA: Diagnosis not present

## 2018-05-30 DIAGNOSIS — R0602 Shortness of breath: Secondary | ICD-10-CM

## 2018-05-30 DIAGNOSIS — I10 Essential (primary) hypertension: Secondary | ICD-10-CM | POA: Diagnosis not present

## 2018-05-30 DIAGNOSIS — E1165 Type 2 diabetes mellitus with hyperglycemia: Secondary | ICD-10-CM | POA: Diagnosis not present

## 2018-05-30 DIAGNOSIS — R079 Chest pain, unspecified: Secondary | ICD-10-CM | POA: Diagnosis not present

## 2018-06-02 DIAGNOSIS — E8770 Fluid overload, unspecified: Secondary | ICD-10-CM | POA: Diagnosis not present

## 2018-06-02 DIAGNOSIS — I440 Atrioventricular block, first degree: Secondary | ICD-10-CM | POA: Diagnosis not present

## 2018-06-02 DIAGNOSIS — I2510 Atherosclerotic heart disease of native coronary artery without angina pectoris: Secondary | ICD-10-CM | POA: Diagnosis not present

## 2018-06-02 DIAGNOSIS — Z951 Presence of aortocoronary bypass graft: Secondary | ICD-10-CM | POA: Diagnosis not present

## 2018-06-02 DIAGNOSIS — Z885 Allergy status to narcotic agent status: Secondary | ICD-10-CM | POA: Diagnosis not present

## 2018-06-02 DIAGNOSIS — R0609 Other forms of dyspnea: Secondary | ICD-10-CM | POA: Diagnosis not present

## 2018-06-02 DIAGNOSIS — R000 Tachycardia, unspecified: Secondary | ICD-10-CM | POA: Diagnosis not present

## 2018-06-12 DIAGNOSIS — M1711 Unilateral primary osteoarthritis, right knee: Secondary | ICD-10-CM | POA: Diagnosis not present

## 2018-06-12 DIAGNOSIS — M1712 Unilateral primary osteoarthritis, left knee: Secondary | ICD-10-CM | POA: Diagnosis not present

## 2018-06-12 DIAGNOSIS — M170 Bilateral primary osteoarthritis of knee: Secondary | ICD-10-CM | POA: Diagnosis not present

## 2018-06-14 DIAGNOSIS — H35431 Paving stone degeneration of retina, right eye: Secondary | ICD-10-CM | POA: Diagnosis not present

## 2018-06-14 DIAGNOSIS — H43813 Vitreous degeneration, bilateral: Secondary | ICD-10-CM | POA: Diagnosis not present

## 2018-06-14 DIAGNOSIS — H353221 Exudative age-related macular degeneration, left eye, with active choroidal neovascularization: Secondary | ICD-10-CM | POA: Diagnosis not present

## 2018-06-14 DIAGNOSIS — H353213 Exudative age-related macular degeneration, right eye, with inactive scar: Secondary | ICD-10-CM | POA: Diagnosis not present

## 2018-06-16 DIAGNOSIS — R0609 Other forms of dyspnea: Secondary | ICD-10-CM | POA: Diagnosis not present

## 2018-06-30 DIAGNOSIS — Z79899 Other long term (current) drug therapy: Secondary | ICD-10-CM | POA: Diagnosis not present

## 2018-06-30 DIAGNOSIS — E119 Type 2 diabetes mellitus without complications: Secondary | ICD-10-CM | POA: Diagnosis not present

## 2018-06-30 DIAGNOSIS — Z7984 Long term (current) use of oral hypoglycemic drugs: Secondary | ICD-10-CM | POA: Diagnosis not present

## 2018-06-30 DIAGNOSIS — E785 Hyperlipidemia, unspecified: Secondary | ICD-10-CM | POA: Diagnosis not present

## 2018-06-30 DIAGNOSIS — Z9889 Other specified postprocedural states: Secondary | ICD-10-CM | POA: Diagnosis not present

## 2018-06-30 DIAGNOSIS — I2582 Chronic total occlusion of coronary artery: Secondary | ICD-10-CM | POA: Diagnosis not present

## 2018-06-30 DIAGNOSIS — R079 Chest pain, unspecified: Secondary | ICD-10-CM | POA: Diagnosis not present

## 2018-06-30 DIAGNOSIS — I2510 Atherosclerotic heart disease of native coronary artery without angina pectoris: Secondary | ICD-10-CM | POA: Diagnosis not present

## 2018-06-30 DIAGNOSIS — Z951 Presence of aortocoronary bypass graft: Secondary | ICD-10-CM | POA: Diagnosis not present

## 2018-06-30 DIAGNOSIS — I2584 Coronary atherosclerosis due to calcified coronary lesion: Secondary | ICD-10-CM | POA: Diagnosis not present

## 2018-06-30 DIAGNOSIS — I10 Essential (primary) hypertension: Secondary | ICD-10-CM | POA: Diagnosis not present

## 2018-06-30 DIAGNOSIS — R931 Abnormal findings on diagnostic imaging of heart and coronary circulation: Secondary | ICD-10-CM | POA: Diagnosis not present

## 2018-07-07 DIAGNOSIS — N5201 Erectile dysfunction due to arterial insufficiency: Secondary | ICD-10-CM | POA: Diagnosis not present

## 2018-07-07 DIAGNOSIS — E7800 Pure hypercholesterolemia, unspecified: Secondary | ICD-10-CM | POA: Diagnosis not present

## 2018-07-07 DIAGNOSIS — I10 Essential (primary) hypertension: Secondary | ICD-10-CM | POA: Diagnosis not present

## 2018-07-07 DIAGNOSIS — I2510 Atherosclerotic heart disease of native coronary artery without angina pectoris: Secondary | ICD-10-CM | POA: Diagnosis not present

## 2018-07-24 DIAGNOSIS — Z951 Presence of aortocoronary bypass graft: Secondary | ICD-10-CM | POA: Diagnosis not present

## 2018-07-24 DIAGNOSIS — I5030 Unspecified diastolic (congestive) heart failure: Secondary | ICD-10-CM | POA: Diagnosis not present

## 2018-07-24 DIAGNOSIS — R0609 Other forms of dyspnea: Secondary | ICD-10-CM | POA: Diagnosis not present

## 2018-07-24 DIAGNOSIS — I259 Chronic ischemic heart disease, unspecified: Secondary | ICD-10-CM | POA: Diagnosis not present

## 2018-07-24 DIAGNOSIS — I440 Atrioventricular block, first degree: Secondary | ICD-10-CM | POA: Diagnosis not present

## 2018-07-24 DIAGNOSIS — Z79899 Other long term (current) drug therapy: Secondary | ICD-10-CM | POA: Diagnosis not present

## 2018-08-08 DIAGNOSIS — I5032 Chronic diastolic (congestive) heart failure: Secondary | ICD-10-CM | POA: Diagnosis not present

## 2018-08-08 DIAGNOSIS — R072 Precordial pain: Secondary | ICD-10-CM | POA: Diagnosis not present

## 2018-08-08 DIAGNOSIS — I471 Supraventricular tachycardia: Secondary | ICD-10-CM | POA: Diagnosis not present

## 2018-08-08 DIAGNOSIS — R002 Palpitations: Secondary | ICD-10-CM | POA: Diagnosis not present

## 2018-08-08 DIAGNOSIS — E876 Hypokalemia: Secondary | ICD-10-CM | POA: Diagnosis present

## 2018-08-08 DIAGNOSIS — R918 Other nonspecific abnormal finding of lung field: Secondary | ICD-10-CM | POA: Diagnosis not present

## 2018-08-08 DIAGNOSIS — I255 Ischemic cardiomyopathy: Secondary | ICD-10-CM | POA: Diagnosis present

## 2018-08-08 DIAGNOSIS — E785 Hyperlipidemia, unspecified: Secondary | ICD-10-CM | POA: Diagnosis present

## 2018-08-08 DIAGNOSIS — I5030 Unspecified diastolic (congestive) heart failure: Secondary | ICD-10-CM | POA: Diagnosis not present

## 2018-08-08 DIAGNOSIS — I4891 Unspecified atrial fibrillation: Secondary | ICD-10-CM | POA: Diagnosis present

## 2018-08-08 DIAGNOSIS — I110 Hypertensive heart disease with heart failure: Secondary | ICD-10-CM | POA: Diagnosis not present

## 2018-08-08 DIAGNOSIS — R310 Gross hematuria: Secondary | ICD-10-CM | POA: Diagnosis not present

## 2018-08-08 DIAGNOSIS — R0602 Shortness of breath: Secondary | ICD-10-CM | POA: Diagnosis not present

## 2018-08-08 DIAGNOSIS — K5909 Other constipation: Secondary | ICD-10-CM | POA: Diagnosis present

## 2018-08-08 DIAGNOSIS — Z9889 Other specified postprocedural states: Secondary | ICD-10-CM | POA: Diagnosis not present

## 2018-08-08 DIAGNOSIS — I2510 Atherosclerotic heart disease of native coronary artery without angina pectoris: Secondary | ICD-10-CM | POA: Diagnosis present

## 2018-08-08 DIAGNOSIS — R9431 Abnormal electrocardiogram [ECG] [EKG]: Secondary | ICD-10-CM | POA: Diagnosis not present

## 2018-08-08 DIAGNOSIS — R0789 Other chest pain: Secondary | ICD-10-CM | POA: Diagnosis not present

## 2018-08-08 DIAGNOSIS — Z95810 Presence of automatic (implantable) cardiac defibrillator: Secondary | ICD-10-CM | POA: Diagnosis not present

## 2018-08-08 DIAGNOSIS — I472 Ventricular tachycardia: Secondary | ICD-10-CM | POA: Diagnosis not present

## 2018-08-08 DIAGNOSIS — Z7984 Long term (current) use of oral hypoglycemic drugs: Secondary | ICD-10-CM | POA: Diagnosis not present

## 2018-08-08 DIAGNOSIS — R42 Dizziness and giddiness: Secondary | ICD-10-CM | POA: Diagnosis not present

## 2018-08-08 DIAGNOSIS — I491 Atrial premature depolarization: Secondary | ICD-10-CM | POA: Diagnosis not present

## 2018-08-08 DIAGNOSIS — I493 Ventricular premature depolarization: Secondary | ICD-10-CM | POA: Diagnosis not present

## 2018-08-08 DIAGNOSIS — Z7982 Long term (current) use of aspirin: Secondary | ICD-10-CM | POA: Diagnosis not present

## 2018-08-08 DIAGNOSIS — R600 Localized edema: Secondary | ICD-10-CM | POA: Diagnosis not present

## 2018-08-08 DIAGNOSIS — R000 Tachycardia, unspecified: Secondary | ICD-10-CM | POA: Diagnosis not present

## 2018-08-08 DIAGNOSIS — I5022 Chronic systolic (congestive) heart failure: Secondary | ICD-10-CM | POA: Diagnosis present

## 2018-08-08 DIAGNOSIS — E119 Type 2 diabetes mellitus without complications: Secondary | ICD-10-CM | POA: Diagnosis present

## 2018-08-08 DIAGNOSIS — I25119 Atherosclerotic heart disease of native coronary artery with unspecified angina pectoris: Secondary | ICD-10-CM | POA: Diagnosis not present

## 2018-08-08 DIAGNOSIS — Z951 Presence of aortocoronary bypass graft: Secondary | ICD-10-CM | POA: Diagnosis not present

## 2018-08-08 DIAGNOSIS — R079 Chest pain, unspecified: Secondary | ICD-10-CM | POA: Diagnosis not present

## 2018-08-08 DIAGNOSIS — I959 Hypotension, unspecified: Secondary | ICD-10-CM | POA: Diagnosis not present

## 2018-08-08 DIAGNOSIS — E538 Deficiency of other specified B group vitamins: Secondary | ICD-10-CM | POA: Diagnosis present

## 2018-08-08 DIAGNOSIS — R300 Dysuria: Secondary | ICD-10-CM | POA: Diagnosis not present

## 2018-08-08 DIAGNOSIS — I440 Atrioventricular block, first degree: Secondary | ICD-10-CM | POA: Diagnosis present

## 2018-08-08 DIAGNOSIS — R319 Hematuria, unspecified: Secondary | ICD-10-CM | POA: Diagnosis not present

## 2018-08-22 DIAGNOSIS — M25512 Pain in left shoulder: Secondary | ICD-10-CM | POA: Diagnosis not present

## 2018-08-23 DIAGNOSIS — H35431 Paving stone degeneration of retina, right eye: Secondary | ICD-10-CM | POA: Diagnosis not present

## 2018-08-23 DIAGNOSIS — H43813 Vitreous degeneration, bilateral: Secondary | ICD-10-CM | POA: Diagnosis not present

## 2018-08-23 DIAGNOSIS — H353221 Exudative age-related macular degeneration, left eye, with active choroidal neovascularization: Secondary | ICD-10-CM | POA: Diagnosis not present

## 2018-08-23 DIAGNOSIS — H353213 Exudative age-related macular degeneration, right eye, with inactive scar: Secondary | ICD-10-CM | POA: Diagnosis not present

## 2018-08-24 DIAGNOSIS — I110 Hypertensive heart disease with heart failure: Secondary | ICD-10-CM | POA: Diagnosis present

## 2018-08-24 DIAGNOSIS — I5020 Unspecified systolic (congestive) heart failure: Secondary | ICD-10-CM | POA: Diagnosis not present

## 2018-08-24 DIAGNOSIS — M1990 Unspecified osteoarthritis, unspecified site: Secondary | ICD-10-CM | POA: Diagnosis present

## 2018-08-24 DIAGNOSIS — I472 Ventricular tachycardia: Secondary | ICD-10-CM | POA: Diagnosis not present

## 2018-08-24 DIAGNOSIS — I4891 Unspecified atrial fibrillation: Secondary | ICD-10-CM | POA: Diagnosis not present

## 2018-08-24 DIAGNOSIS — I255 Ischemic cardiomyopathy: Secondary | ICD-10-CM | POA: Diagnosis not present

## 2018-08-24 DIAGNOSIS — I5022 Chronic systolic (congestive) heart failure: Secondary | ICD-10-CM | POA: Diagnosis present

## 2018-08-24 DIAGNOSIS — I2510 Atherosclerotic heart disease of native coronary artery without angina pectoris: Secondary | ICD-10-CM | POA: Diagnosis not present

## 2018-08-24 DIAGNOSIS — Z95810 Presence of automatic (implantable) cardiac defibrillator: Secondary | ICD-10-CM | POA: Diagnosis not present

## 2018-08-24 DIAGNOSIS — I4901 Ventricular fibrillation: Secondary | ICD-10-CM | POA: Diagnosis not present

## 2018-08-24 DIAGNOSIS — Z79899 Other long term (current) drug therapy: Secondary | ICD-10-CM | POA: Diagnosis not present

## 2018-08-24 DIAGNOSIS — E785 Hyperlipidemia, unspecified: Secondary | ICD-10-CM | POA: Diagnosis not present

## 2018-08-24 DIAGNOSIS — Z951 Presence of aortocoronary bypass graft: Secondary | ICD-10-CM | POA: Diagnosis not present

## 2018-08-24 DIAGNOSIS — I440 Atrioventricular block, first degree: Secondary | ICD-10-CM | POA: Diagnosis not present

## 2018-08-24 DIAGNOSIS — Z7984 Long term (current) use of oral hypoglycemic drugs: Secondary | ICD-10-CM | POA: Diagnosis not present

## 2018-08-24 DIAGNOSIS — R9431 Abnormal electrocardiogram [ECG] [EKG]: Secondary | ICD-10-CM | POA: Diagnosis not present

## 2018-08-24 DIAGNOSIS — Z6830 Body mass index (BMI) 30.0-30.9, adult: Secondary | ICD-10-CM | POA: Diagnosis not present

## 2018-08-24 DIAGNOSIS — Z9889 Other specified postprocedural states: Secondary | ICD-10-CM | POA: Diagnosis not present

## 2018-08-24 DIAGNOSIS — Z4502 Encounter for adjustment and management of automatic implantable cardiac defibrillator: Secondary | ICD-10-CM | POA: Diagnosis not present

## 2018-08-24 DIAGNOSIS — E119 Type 2 diabetes mellitus without complications: Secondary | ICD-10-CM | POA: Diagnosis not present

## 2018-08-24 DIAGNOSIS — R0789 Other chest pain: Secondary | ICD-10-CM | POA: Diagnosis present

## 2018-08-24 DIAGNOSIS — R42 Dizziness and giddiness: Secondary | ICD-10-CM | POA: Diagnosis present

## 2018-08-24 DIAGNOSIS — E669 Obesity, unspecified: Secondary | ICD-10-CM | POA: Diagnosis present

## 2018-08-28 DIAGNOSIS — I472 Ventricular tachycardia: Secondary | ICD-10-CM | POA: Diagnosis present

## 2018-08-28 DIAGNOSIS — Z5181 Encounter for therapeutic drug level monitoring: Secondary | ICD-10-CM | POA: Diagnosis not present

## 2018-08-28 DIAGNOSIS — R9431 Abnormal electrocardiogram [ECG] [EKG]: Secondary | ICD-10-CM | POA: Diagnosis not present

## 2018-08-28 DIAGNOSIS — M1990 Unspecified osteoarthritis, unspecified site: Secondary | ICD-10-CM | POA: Diagnosis present

## 2018-08-28 DIAGNOSIS — E785 Hyperlipidemia, unspecified: Secondary | ICD-10-CM | POA: Diagnosis present

## 2018-08-28 DIAGNOSIS — Z79899 Other long term (current) drug therapy: Secondary | ICD-10-CM | POA: Diagnosis not present

## 2018-08-28 DIAGNOSIS — I5022 Chronic systolic (congestive) heart failure: Secondary | ICD-10-CM | POA: Diagnosis present

## 2018-08-28 DIAGNOSIS — I255 Ischemic cardiomyopathy: Secondary | ICD-10-CM | POA: Diagnosis present

## 2018-08-28 DIAGNOSIS — E119 Type 2 diabetes mellitus without complications: Secondary | ICD-10-CM | POA: Diagnosis present

## 2018-08-28 DIAGNOSIS — Z951 Presence of aortocoronary bypass graft: Secondary | ICD-10-CM | POA: Diagnosis not present

## 2018-08-28 DIAGNOSIS — I110 Hypertensive heart disease with heart failure: Secondary | ICD-10-CM | POA: Diagnosis present

## 2018-08-28 DIAGNOSIS — Z794 Long term (current) use of insulin: Secondary | ICD-10-CM | POA: Diagnosis not present

## 2018-08-28 DIAGNOSIS — Z9889 Other specified postprocedural states: Secondary | ICD-10-CM | POA: Diagnosis not present

## 2018-08-28 DIAGNOSIS — I2510 Atherosclerotic heart disease of native coronary artery without angina pectoris: Secondary | ICD-10-CM | POA: Diagnosis present

## 2018-08-28 DIAGNOSIS — I4891 Unspecified atrial fibrillation: Secondary | ICD-10-CM | POA: Diagnosis present

## 2018-08-28 DIAGNOSIS — I5020 Unspecified systolic (congestive) heart failure: Secondary | ICD-10-CM | POA: Diagnosis not present

## 2018-08-28 DIAGNOSIS — I440 Atrioventricular block, first degree: Secondary | ICD-10-CM | POA: Diagnosis not present

## 2018-08-28 DIAGNOSIS — Z95810 Presence of automatic (implantable) cardiac defibrillator: Secondary | ICD-10-CM | POA: Diagnosis not present

## 2018-09-18 DIAGNOSIS — I472 Ventricular tachycardia: Secondary | ICD-10-CM | POA: Diagnosis not present

## 2018-09-18 DIAGNOSIS — R0989 Other specified symptoms and signs involving the circulatory and respiratory systems: Secondary | ICD-10-CM | POA: Diagnosis not present

## 2018-11-06 DIAGNOSIS — R9431 Abnormal electrocardiogram [ECG] [EKG]: Secondary | ICD-10-CM | POA: Diagnosis not present

## 2018-11-06 DIAGNOSIS — I878 Other specified disorders of veins: Secondary | ICD-10-CM | POA: Diagnosis not present

## 2018-11-06 DIAGNOSIS — I5023 Acute on chronic systolic (congestive) heart failure: Secondary | ICD-10-CM | POA: Diagnosis not present

## 2018-11-06 DIAGNOSIS — I472 Ventricular tachycardia: Secondary | ICD-10-CM | POA: Diagnosis not present

## 2018-11-06 DIAGNOSIS — I255 Ischemic cardiomyopathy: Secondary | ICD-10-CM | POA: Diagnosis not present

## 2018-11-06 DIAGNOSIS — I110 Hypertensive heart disease with heart failure: Secondary | ICD-10-CM | POA: Diagnosis not present

## 2018-11-06 DIAGNOSIS — Z950 Presence of cardiac pacemaker: Secondary | ICD-10-CM | POA: Diagnosis not present

## 2018-11-06 DIAGNOSIS — Z79899 Other long term (current) drug therapy: Secondary | ICD-10-CM | POA: Diagnosis not present

## 2018-11-06 DIAGNOSIS — R0600 Dyspnea, unspecified: Secondary | ICD-10-CM | POA: Diagnosis not present

## 2018-11-06 DIAGNOSIS — I440 Atrioventricular block, first degree: Secondary | ICD-10-CM | POA: Diagnosis not present

## 2018-11-06 DIAGNOSIS — Z95810 Presence of automatic (implantable) cardiac defibrillator: Secondary | ICD-10-CM | POA: Diagnosis not present

## 2018-11-06 DIAGNOSIS — Z9889 Other specified postprocedural states: Secondary | ICD-10-CM | POA: Diagnosis not present

## 2018-11-08 DIAGNOSIS — H353213 Exudative age-related macular degeneration, right eye, with inactive scar: Secondary | ICD-10-CM | POA: Diagnosis not present

## 2018-11-08 DIAGNOSIS — H43813 Vitreous degeneration, bilateral: Secondary | ICD-10-CM | POA: Diagnosis not present

## 2018-11-08 DIAGNOSIS — E119 Type 2 diabetes mellitus without complications: Secondary | ICD-10-CM | POA: Diagnosis not present

## 2018-11-08 DIAGNOSIS — H353221 Exudative age-related macular degeneration, left eye, with active choroidal neovascularization: Secondary | ICD-10-CM | POA: Diagnosis not present

## 2018-11-10 DIAGNOSIS — I10 Essential (primary) hypertension: Secondary | ICD-10-CM | POA: Diagnosis not present

## 2018-11-10 DIAGNOSIS — R0602 Shortness of breath: Secondary | ICD-10-CM | POA: Diagnosis not present

## 2018-11-10 DIAGNOSIS — I255 Ischemic cardiomyopathy: Secondary | ICD-10-CM | POA: Diagnosis not present

## 2018-11-10 DIAGNOSIS — I472 Ventricular tachycardia: Secondary | ICD-10-CM | POA: Diagnosis not present

## 2018-11-10 DIAGNOSIS — E8770 Fluid overload, unspecified: Secondary | ICD-10-CM | POA: Diagnosis not present

## 2018-11-10 DIAGNOSIS — Z7984 Long term (current) use of oral hypoglycemic drugs: Secondary | ICD-10-CM | POA: Diagnosis not present

## 2018-11-10 DIAGNOSIS — Z951 Presence of aortocoronary bypass graft: Secondary | ICD-10-CM | POA: Diagnosis not present

## 2018-11-10 DIAGNOSIS — Z79899 Other long term (current) drug therapy: Secondary | ICD-10-CM | POA: Diagnosis not present

## 2018-11-10 DIAGNOSIS — I2510 Atherosclerotic heart disease of native coronary artery without angina pectoris: Secondary | ICD-10-CM | POA: Diagnosis not present

## 2018-11-10 DIAGNOSIS — R140 Abdominal distension (gaseous): Secondary | ICD-10-CM | POA: Diagnosis not present

## 2018-11-10 DIAGNOSIS — Z95810 Presence of automatic (implantable) cardiac defibrillator: Secondary | ICD-10-CM | POA: Diagnosis not present

## 2018-11-10 DIAGNOSIS — I420 Dilated cardiomyopathy: Secondary | ICD-10-CM | POA: Diagnosis not present

## 2018-11-10 DIAGNOSIS — E119 Type 2 diabetes mellitus without complications: Secondary | ICD-10-CM | POA: Diagnosis not present

## 2018-11-10 DIAGNOSIS — Z7982 Long term (current) use of aspirin: Secondary | ICD-10-CM | POA: Diagnosis not present

## 2018-11-22 DIAGNOSIS — I472 Ventricular tachycardia: Secondary | ICD-10-CM | POA: Diagnosis not present

## 2018-11-22 DIAGNOSIS — Z4502 Encounter for adjustment and management of automatic implantable cardiac defibrillator: Secondary | ICD-10-CM | POA: Diagnosis not present

## 2018-11-27 DIAGNOSIS — I420 Dilated cardiomyopathy: Secondary | ICD-10-CM | POA: Diagnosis not present

## 2018-12-01 DIAGNOSIS — M7121 Synovial cyst of popliteal space [Baker], right knee: Secondary | ICD-10-CM | POA: Diagnosis not present

## 2018-12-01 DIAGNOSIS — M1711 Unilateral primary osteoarthritis, right knee: Secondary | ICD-10-CM | POA: Diagnosis not present

## 2018-12-06 DIAGNOSIS — Z95810 Presence of automatic (implantable) cardiac defibrillator: Secondary | ICD-10-CM

## 2018-12-06 HISTORY — DX: Presence of automatic (implantable) cardiac defibrillator: Z95.810

## 2018-12-11 DIAGNOSIS — M1711 Unilateral primary osteoarthritis, right knee: Secondary | ICD-10-CM | POA: Diagnosis not present

## 2018-12-12 DIAGNOSIS — E7800 Pure hypercholesterolemia, unspecified: Secondary | ICD-10-CM | POA: Diagnosis not present

## 2018-12-12 DIAGNOSIS — N5201 Erectile dysfunction due to arterial insufficiency: Secondary | ICD-10-CM | POA: Diagnosis not present

## 2018-12-12 DIAGNOSIS — E119 Type 2 diabetes mellitus without complications: Secondary | ICD-10-CM | POA: Diagnosis not present

## 2018-12-12 DIAGNOSIS — I10 Essential (primary) hypertension: Secondary | ICD-10-CM | POA: Diagnosis not present

## 2018-12-12 DIAGNOSIS — Z7984 Long term (current) use of oral hypoglycemic drugs: Secondary | ICD-10-CM | POA: Diagnosis not present

## 2018-12-12 DIAGNOSIS — Z209 Contact with and (suspected) exposure to unspecified communicable disease: Secondary | ICD-10-CM | POA: Diagnosis not present

## 2018-12-13 DIAGNOSIS — Z961 Presence of intraocular lens: Secondary | ICD-10-CM | POA: Diagnosis not present

## 2018-12-13 DIAGNOSIS — H353213 Exudative age-related macular degeneration, right eye, with inactive scar: Secondary | ICD-10-CM | POA: Diagnosis not present

## 2018-12-13 DIAGNOSIS — H353221 Exudative age-related macular degeneration, left eye, with active choroidal neovascularization: Secondary | ICD-10-CM | POA: Diagnosis not present

## 2018-12-13 DIAGNOSIS — H10413 Chronic giant papillary conjunctivitis, bilateral: Secondary | ICD-10-CM | POA: Diagnosis not present

## 2018-12-15 DIAGNOSIS — Z95810 Presence of automatic (implantable) cardiac defibrillator: Secondary | ICD-10-CM | POA: Diagnosis not present

## 2018-12-15 DIAGNOSIS — I5021 Acute systolic (congestive) heart failure: Secondary | ICD-10-CM | POA: Diagnosis not present

## 2018-12-15 DIAGNOSIS — E8770 Fluid overload, unspecified: Secondary | ICD-10-CM | POA: Diagnosis not present

## 2018-12-15 DIAGNOSIS — R140 Abdominal distension (gaseous): Secondary | ICD-10-CM | POA: Diagnosis not present

## 2018-12-15 DIAGNOSIS — I255 Ischemic cardiomyopathy: Secondary | ICD-10-CM | POA: Diagnosis not present

## 2018-12-15 DIAGNOSIS — R0602 Shortness of breath: Secondary | ICD-10-CM | POA: Diagnosis not present

## 2018-12-15 DIAGNOSIS — Z79899 Other long term (current) drug therapy: Secondary | ICD-10-CM | POA: Diagnosis not present

## 2018-12-19 DIAGNOSIS — M1711 Unilateral primary osteoarthritis, right knee: Secondary | ICD-10-CM | POA: Diagnosis not present

## 2018-12-26 DIAGNOSIS — M1711 Unilateral primary osteoarthritis, right knee: Secondary | ICD-10-CM | POA: Diagnosis not present

## 2019-01-01 DIAGNOSIS — I255 Ischemic cardiomyopathy: Secondary | ICD-10-CM | POA: Diagnosis not present

## 2019-01-01 DIAGNOSIS — I480 Paroxysmal atrial fibrillation: Secondary | ICD-10-CM | POA: Diagnosis not present

## 2019-01-01 DIAGNOSIS — I472 Ventricular tachycardia: Secondary | ICD-10-CM | POA: Diagnosis not present

## 2019-01-01 DIAGNOSIS — E119 Type 2 diabetes mellitus without complications: Secondary | ICD-10-CM | POA: Diagnosis not present

## 2019-01-01 DIAGNOSIS — Z951 Presence of aortocoronary bypass graft: Secondary | ICD-10-CM | POA: Diagnosis not present

## 2019-01-01 DIAGNOSIS — Z95810 Presence of automatic (implantable) cardiac defibrillator: Secondary | ICD-10-CM | POA: Diagnosis not present

## 2019-01-01 DIAGNOSIS — I440 Atrioventricular block, first degree: Secondary | ICD-10-CM | POA: Diagnosis not present

## 2019-01-01 DIAGNOSIS — Z885 Allergy status to narcotic agent status: Secondary | ICD-10-CM | POA: Diagnosis not present

## 2019-01-01 DIAGNOSIS — Z79899 Other long term (current) drug therapy: Secondary | ICD-10-CM | POA: Diagnosis not present

## 2019-01-01 DIAGNOSIS — E785 Hyperlipidemia, unspecified: Secondary | ICD-10-CM | POA: Diagnosis not present

## 2019-01-01 DIAGNOSIS — Z7982 Long term (current) use of aspirin: Secondary | ICD-10-CM | POA: Diagnosis not present

## 2019-01-01 DIAGNOSIS — I444 Left anterior fascicular block: Secondary | ICD-10-CM | POA: Diagnosis not present

## 2019-01-01 DIAGNOSIS — I4891 Unspecified atrial fibrillation: Secondary | ICD-10-CM | POA: Diagnosis not present

## 2019-01-01 DIAGNOSIS — R140 Abdominal distension (gaseous): Secondary | ICD-10-CM | POA: Diagnosis not present

## 2019-01-01 DIAGNOSIS — Z9889 Other specified postprocedural states: Secondary | ICD-10-CM | POA: Diagnosis not present

## 2019-01-01 DIAGNOSIS — I110 Hypertensive heart disease with heart failure: Secondary | ICD-10-CM | POA: Diagnosis not present

## 2019-01-01 DIAGNOSIS — R9431 Abnormal electrocardiogram [ECG] [EKG]: Secondary | ICD-10-CM | POA: Diagnosis not present

## 2019-01-01 DIAGNOSIS — I10 Essential (primary) hypertension: Secondary | ICD-10-CM | POA: Diagnosis not present

## 2019-01-01 DIAGNOSIS — I2510 Atherosclerotic heart disease of native coronary artery without angina pectoris: Secondary | ICD-10-CM | POA: Diagnosis not present

## 2019-01-01 DIAGNOSIS — I5020 Unspecified systolic (congestive) heart failure: Secondary | ICD-10-CM | POA: Diagnosis not present

## 2019-01-02 DIAGNOSIS — M1711 Unilateral primary osteoarthritis, right knee: Secondary | ICD-10-CM | POA: Diagnosis not present

## 2019-01-04 DIAGNOSIS — R188 Other ascites: Secondary | ICD-10-CM | POA: Diagnosis not present

## 2019-01-31 DIAGNOSIS — H353213 Exudative age-related macular degeneration, right eye, with inactive scar: Secondary | ICD-10-CM | POA: Diagnosis not present

## 2019-01-31 DIAGNOSIS — H353221 Exudative age-related macular degeneration, left eye, with active choroidal neovascularization: Secondary | ICD-10-CM | POA: Diagnosis not present

## 2019-01-31 DIAGNOSIS — H43813 Vitreous degeneration, bilateral: Secondary | ICD-10-CM | POA: Diagnosis not present

## 2019-01-31 DIAGNOSIS — H35431 Paving stone degeneration of retina, right eye: Secondary | ICD-10-CM | POA: Diagnosis not present

## 2019-02-14 DIAGNOSIS — R05 Cough: Secondary | ICD-10-CM | POA: Diagnosis not present

## 2019-02-14 DIAGNOSIS — J069 Acute upper respiratory infection, unspecified: Secondary | ICD-10-CM | POA: Diagnosis not present

## 2019-02-19 DIAGNOSIS — J069 Acute upper respiratory infection, unspecified: Secondary | ICD-10-CM | POA: Diagnosis not present

## 2019-02-19 DIAGNOSIS — R05 Cough: Secondary | ICD-10-CM | POA: Diagnosis not present

## 2019-02-21 DIAGNOSIS — Z4502 Encounter for adjustment and management of automatic implantable cardiac defibrillator: Secondary | ICD-10-CM | POA: Diagnosis not present

## 2019-02-21 DIAGNOSIS — I472 Ventricular tachycardia: Secondary | ICD-10-CM | POA: Diagnosis not present

## 2019-03-27 DIAGNOSIS — X32XXXD Exposure to sunlight, subsequent encounter: Secondary | ICD-10-CM | POA: Diagnosis not present

## 2019-03-27 DIAGNOSIS — D044 Carcinoma in situ of skin of scalp and neck: Secondary | ICD-10-CM | POA: Diagnosis not present

## 2019-03-27 DIAGNOSIS — L570 Actinic keratosis: Secondary | ICD-10-CM | POA: Diagnosis not present

## 2019-03-27 DIAGNOSIS — D0422 Carcinoma in situ of skin of left ear and external auricular canal: Secondary | ICD-10-CM | POA: Diagnosis not present

## 2019-03-30 DIAGNOSIS — Z2009 Contact with and (suspected) exposure to other intestinal infectious diseases: Secondary | ICD-10-CM | POA: Diagnosis not present

## 2019-03-30 DIAGNOSIS — R140 Abdominal distension (gaseous): Secondary | ICD-10-CM | POA: Diagnosis not present

## 2019-03-30 DIAGNOSIS — R0609 Other forms of dyspnea: Secondary | ICD-10-CM | POA: Diagnosis not present

## 2019-03-30 DIAGNOSIS — E669 Obesity, unspecified: Secondary | ICD-10-CM | POA: Diagnosis not present

## 2019-03-30 DIAGNOSIS — R143 Flatulence: Secondary | ICD-10-CM | POA: Diagnosis not present

## 2019-04-13 DIAGNOSIS — I5030 Unspecified diastolic (congestive) heart failure: Secondary | ICD-10-CM | POA: Diagnosis not present

## 2019-04-13 DIAGNOSIS — I255 Ischemic cardiomyopathy: Secondary | ICD-10-CM | POA: Diagnosis not present

## 2019-04-13 DIAGNOSIS — R140 Abdominal distension (gaseous): Secondary | ICD-10-CM | POA: Diagnosis not present

## 2019-04-13 DIAGNOSIS — Z951 Presence of aortocoronary bypass graft: Secondary | ICD-10-CM | POA: Diagnosis not present

## 2019-04-13 DIAGNOSIS — Z79899 Other long term (current) drug therapy: Secondary | ICD-10-CM | POA: Diagnosis not present

## 2019-04-13 DIAGNOSIS — Z95810 Presence of automatic (implantable) cardiac defibrillator: Secondary | ICD-10-CM | POA: Diagnosis not present

## 2019-04-13 DIAGNOSIS — I2510 Atherosclerotic heart disease of native coronary artery without angina pectoris: Secondary | ICD-10-CM | POA: Diagnosis not present

## 2019-05-09 DIAGNOSIS — H353213 Exudative age-related macular degeneration, right eye, with inactive scar: Secondary | ICD-10-CM | POA: Diagnosis not present

## 2019-05-09 DIAGNOSIS — H353221 Exudative age-related macular degeneration, left eye, with active choroidal neovascularization: Secondary | ICD-10-CM | POA: Diagnosis not present

## 2019-06-14 DIAGNOSIS — N5201 Erectile dysfunction due to arterial insufficiency: Secondary | ICD-10-CM | POA: Diagnosis not present

## 2019-06-14 DIAGNOSIS — Z1211 Encounter for screening for malignant neoplasm of colon: Secondary | ICD-10-CM | POA: Diagnosis not present

## 2019-06-14 DIAGNOSIS — R0602 Shortness of breath: Secondary | ICD-10-CM | POA: Diagnosis not present

## 2019-06-14 DIAGNOSIS — Z209 Contact with and (suspected) exposure to unspecified communicable disease: Secondary | ICD-10-CM | POA: Diagnosis not present

## 2019-06-14 DIAGNOSIS — E7800 Pure hypercholesterolemia, unspecified: Secondary | ICD-10-CM | POA: Diagnosis not present

## 2019-06-14 DIAGNOSIS — Z125 Encounter for screening for malignant neoplasm of prostate: Secondary | ICD-10-CM | POA: Diagnosis not present

## 2019-06-14 DIAGNOSIS — I2510 Atherosclerotic heart disease of native coronary artery without angina pectoris: Secondary | ICD-10-CM | POA: Diagnosis not present

## 2019-06-14 DIAGNOSIS — Z0000 Encounter for general adult medical examination without abnormal findings: Secondary | ICD-10-CM | POA: Diagnosis not present

## 2019-06-14 DIAGNOSIS — I10 Essential (primary) hypertension: Secondary | ICD-10-CM | POA: Diagnosis not present

## 2019-06-14 DIAGNOSIS — E119 Type 2 diabetes mellitus without complications: Secondary | ICD-10-CM | POA: Diagnosis not present

## 2019-06-29 DIAGNOSIS — L570 Actinic keratosis: Secondary | ICD-10-CM | POA: Diagnosis not present

## 2019-06-29 DIAGNOSIS — Z85828 Personal history of other malignant neoplasm of skin: Secondary | ICD-10-CM | POA: Diagnosis not present

## 2019-06-29 DIAGNOSIS — Z08 Encounter for follow-up examination after completed treatment for malignant neoplasm: Secondary | ICD-10-CM | POA: Diagnosis not present

## 2019-06-29 DIAGNOSIS — D2239 Melanocytic nevi of other parts of face: Secondary | ICD-10-CM | POA: Diagnosis not present

## 2019-06-29 DIAGNOSIS — X32XXXD Exposure to sunlight, subsequent encounter: Secondary | ICD-10-CM | POA: Diagnosis not present

## 2019-07-02 DIAGNOSIS — I440 Atrioventricular block, first degree: Secondary | ICD-10-CM | POA: Diagnosis not present

## 2019-07-02 DIAGNOSIS — I472 Ventricular tachycardia: Secondary | ICD-10-CM | POA: Diagnosis not present

## 2019-07-02 DIAGNOSIS — R601 Generalized edema: Secondary | ICD-10-CM | POA: Diagnosis not present

## 2019-07-02 DIAGNOSIS — I4891 Unspecified atrial fibrillation: Secondary | ICD-10-CM | POA: Diagnosis not present

## 2019-07-02 DIAGNOSIS — Z79899 Other long term (current) drug therapy: Secondary | ICD-10-CM | POA: Diagnosis not present

## 2019-07-02 DIAGNOSIS — I10 Essential (primary) hypertension: Secondary | ICD-10-CM | POA: Diagnosis not present

## 2019-07-02 DIAGNOSIS — R600 Localized edema: Secondary | ICD-10-CM | POA: Diagnosis not present

## 2019-07-02 DIAGNOSIS — Z951 Presence of aortocoronary bypass graft: Secondary | ICD-10-CM | POA: Diagnosis not present

## 2019-07-02 DIAGNOSIS — I493 Ventricular premature depolarization: Secondary | ICD-10-CM | POA: Diagnosis not present

## 2019-07-02 DIAGNOSIS — Z95810 Presence of automatic (implantable) cardiac defibrillator: Secondary | ICD-10-CM | POA: Diagnosis not present

## 2019-07-02 DIAGNOSIS — I255 Ischemic cardiomyopathy: Secondary | ICD-10-CM | POA: Diagnosis not present

## 2019-07-10 DIAGNOSIS — I472 Ventricular tachycardia: Secondary | ICD-10-CM | POA: Diagnosis not present

## 2019-07-10 DIAGNOSIS — R140 Abdominal distension (gaseous): Secondary | ICD-10-CM | POA: Diagnosis not present

## 2019-07-10 DIAGNOSIS — I5023 Acute on chronic systolic (congestive) heart failure: Secondary | ICD-10-CM | POA: Diagnosis not present

## 2019-07-10 DIAGNOSIS — E8770 Fluid overload, unspecified: Secondary | ICD-10-CM | POA: Diagnosis not present

## 2019-07-10 DIAGNOSIS — R0602 Shortness of breath: Secondary | ICD-10-CM | POA: Diagnosis not present

## 2019-07-10 DIAGNOSIS — Z95810 Presence of automatic (implantable) cardiac defibrillator: Secondary | ICD-10-CM | POA: Diagnosis not present

## 2019-07-15 DIAGNOSIS — Z1211 Encounter for screening for malignant neoplasm of colon: Secondary | ICD-10-CM | POA: Diagnosis not present

## 2019-07-16 DIAGNOSIS — M1711 Unilateral primary osteoarthritis, right knee: Secondary | ICD-10-CM | POA: Diagnosis not present

## 2019-07-20 DIAGNOSIS — I493 Ventricular premature depolarization: Secondary | ICD-10-CM | POA: Diagnosis not present

## 2019-07-23 DIAGNOSIS — I493 Ventricular premature depolarization: Secondary | ICD-10-CM | POA: Diagnosis not present

## 2019-07-23 DIAGNOSIS — I441 Atrioventricular block, second degree: Secondary | ICD-10-CM | POA: Diagnosis not present

## 2019-07-23 DIAGNOSIS — I440 Atrioventricular block, first degree: Secondary | ICD-10-CM | POA: Diagnosis not present

## 2019-07-24 DIAGNOSIS — M1711 Unilateral primary osteoarthritis, right knee: Secondary | ICD-10-CM | POA: Diagnosis not present

## 2019-07-31 DIAGNOSIS — M1711 Unilateral primary osteoarthritis, right knee: Secondary | ICD-10-CM | POA: Diagnosis not present

## 2019-08-17 DIAGNOSIS — Z79899 Other long term (current) drug therapy: Secondary | ICD-10-CM | POA: Diagnosis not present

## 2019-08-17 DIAGNOSIS — R140 Abdominal distension (gaseous): Secondary | ICD-10-CM | POA: Diagnosis not present

## 2019-08-17 DIAGNOSIS — Z95810 Presence of automatic (implantable) cardiac defibrillator: Secondary | ICD-10-CM | POA: Diagnosis not present

## 2019-08-17 DIAGNOSIS — R0602 Shortness of breath: Secondary | ICD-10-CM | POA: Diagnosis not present

## 2019-08-17 DIAGNOSIS — I255 Ischemic cardiomyopathy: Secondary | ICD-10-CM | POA: Diagnosis not present

## 2019-08-17 DIAGNOSIS — I472 Ventricular tachycardia: Secondary | ICD-10-CM | POA: Diagnosis not present

## 2019-08-20 DIAGNOSIS — I472 Ventricular tachycardia: Secondary | ICD-10-CM | POA: Diagnosis not present

## 2019-08-20 DIAGNOSIS — I493 Ventricular premature depolarization: Secondary | ICD-10-CM | POA: Diagnosis not present

## 2019-08-20 DIAGNOSIS — Z95810 Presence of automatic (implantable) cardiac defibrillator: Secondary | ICD-10-CM | POA: Diagnosis not present

## 2019-08-20 DIAGNOSIS — Z9889 Other specified postprocedural states: Secondary | ICD-10-CM | POA: Diagnosis not present

## 2019-08-21 DIAGNOSIS — H353221 Exudative age-related macular degeneration, left eye, with active choroidal neovascularization: Secondary | ICD-10-CM | POA: Diagnosis not present

## 2019-08-21 DIAGNOSIS — H353213 Exudative age-related macular degeneration, right eye, with inactive scar: Secondary | ICD-10-CM | POA: Diagnosis not present

## 2019-08-21 DIAGNOSIS — H35373 Puckering of macula, bilateral: Secondary | ICD-10-CM | POA: Diagnosis not present

## 2019-09-03 DIAGNOSIS — Z95810 Presence of automatic (implantable) cardiac defibrillator: Secondary | ICD-10-CM | POA: Diagnosis not present

## 2019-09-03 DIAGNOSIS — Z4502 Encounter for adjustment and management of automatic implantable cardiac defibrillator: Secondary | ICD-10-CM | POA: Diagnosis not present

## 2019-09-03 DIAGNOSIS — I472 Ventricular tachycardia: Secondary | ICD-10-CM | POA: Diagnosis not present

## 2019-09-17 DIAGNOSIS — I493 Ventricular premature depolarization: Secondary | ICD-10-CM | POA: Diagnosis not present

## 2019-09-17 DIAGNOSIS — Z20828 Contact with and (suspected) exposure to other viral communicable diseases: Secondary | ICD-10-CM | POA: Diagnosis not present

## 2019-09-17 DIAGNOSIS — Z01812 Encounter for preprocedural laboratory examination: Secondary | ICD-10-CM | POA: Diagnosis not present

## 2019-09-24 DIAGNOSIS — Z7984 Long term (current) use of oral hypoglycemic drugs: Secondary | ICD-10-CM | POA: Diagnosis not present

## 2019-09-24 DIAGNOSIS — R140 Abdominal distension (gaseous): Secondary | ICD-10-CM | POA: Diagnosis not present

## 2019-09-24 DIAGNOSIS — E119 Type 2 diabetes mellitus without complications: Secondary | ICD-10-CM | POA: Diagnosis not present

## 2019-09-24 DIAGNOSIS — K5900 Constipation, unspecified: Secondary | ICD-10-CM | POA: Diagnosis not present

## 2019-09-24 DIAGNOSIS — A049 Bacterial intestinal infection, unspecified: Secondary | ICD-10-CM | POA: Diagnosis not present

## 2019-09-24 DIAGNOSIS — I509 Heart failure, unspecified: Secondary | ICD-10-CM | POA: Diagnosis not present

## 2019-09-24 DIAGNOSIS — I110 Hypertensive heart disease with heart failure: Secondary | ICD-10-CM | POA: Diagnosis not present

## 2019-09-24 DIAGNOSIS — R109 Unspecified abdominal pain: Secondary | ICD-10-CM | POA: Diagnosis not present

## 2019-10-01 DIAGNOSIS — E119 Type 2 diabetes mellitus without complications: Secondary | ICD-10-CM | POA: Diagnosis not present

## 2019-10-01 DIAGNOSIS — I472 Ventricular tachycardia: Secondary | ICD-10-CM | POA: Diagnosis not present

## 2019-10-01 DIAGNOSIS — I255 Ischemic cardiomyopathy: Secondary | ICD-10-CM | POA: Diagnosis not present

## 2019-10-01 DIAGNOSIS — Z01818 Encounter for other preprocedural examination: Secondary | ICD-10-CM | POA: Diagnosis not present

## 2019-10-01 DIAGNOSIS — I5020 Unspecified systolic (congestive) heart failure: Secondary | ICD-10-CM | POA: Diagnosis not present

## 2019-10-01 DIAGNOSIS — Z7984 Long term (current) use of oral hypoglycemic drugs: Secondary | ICD-10-CM | POA: Diagnosis not present

## 2019-10-01 DIAGNOSIS — I110 Hypertensive heart disease with heart failure: Secondary | ICD-10-CM | POA: Diagnosis not present

## 2019-10-03 DIAGNOSIS — Z01818 Encounter for other preprocedural examination: Secondary | ICD-10-CM | POA: Diagnosis not present

## 2019-10-05 DIAGNOSIS — I493 Ventricular premature depolarization: Secondary | ICD-10-CM | POA: Diagnosis not present

## 2019-10-05 DIAGNOSIS — R140 Abdominal distension (gaseous): Secondary | ICD-10-CM | POA: Diagnosis not present

## 2019-10-05 DIAGNOSIS — Z538 Procedure and treatment not carried out for other reasons: Secondary | ICD-10-CM | POA: Diagnosis not present

## 2019-10-05 DIAGNOSIS — R0602 Shortness of breath: Secondary | ICD-10-CM | POA: Diagnosis not present

## 2019-11-09 DIAGNOSIS — Z20828 Contact with and (suspected) exposure to other viral communicable diseases: Secondary | ICD-10-CM | POA: Diagnosis not present

## 2019-11-09 DIAGNOSIS — Z01812 Encounter for preprocedural laboratory examination: Secondary | ICD-10-CM | POA: Diagnosis not present

## 2019-11-09 DIAGNOSIS — I472 Ventricular tachycardia: Secondary | ICD-10-CM | POA: Diagnosis not present

## 2019-11-09 DIAGNOSIS — I493 Ventricular premature depolarization: Secondary | ICD-10-CM | POA: Diagnosis not present

## 2019-11-15 DIAGNOSIS — E669 Obesity, unspecified: Secondary | ICD-10-CM | POA: Diagnosis present

## 2019-11-15 DIAGNOSIS — I952 Hypotension due to drugs: Secondary | ICD-10-CM | POA: Diagnosis not present

## 2019-11-15 DIAGNOSIS — T4145XA Adverse effect of unspecified anesthetic, initial encounter: Secondary | ICD-10-CM | POA: Diagnosis not present

## 2019-11-15 DIAGNOSIS — I517 Cardiomegaly: Secondary | ICD-10-CM | POA: Diagnosis not present

## 2019-11-15 DIAGNOSIS — Z9889 Other specified postprocedural states: Secondary | ICD-10-CM | POA: Diagnosis not present

## 2019-11-15 DIAGNOSIS — E785 Hyperlipidemia, unspecified: Secondary | ICD-10-CM | POA: Diagnosis present

## 2019-11-15 DIAGNOSIS — I440 Atrioventricular block, first degree: Secondary | ICD-10-CM | POA: Diagnosis not present

## 2019-11-15 DIAGNOSIS — I5043 Acute on chronic combined systolic (congestive) and diastolic (congestive) heart failure: Secondary | ICD-10-CM | POA: Diagnosis present

## 2019-11-15 DIAGNOSIS — I110 Hypertensive heart disease with heart failure: Secondary | ICD-10-CM | POA: Diagnosis present

## 2019-11-15 DIAGNOSIS — Z7984 Long term (current) use of oral hypoglycemic drugs: Secondary | ICD-10-CM | POA: Diagnosis not present

## 2019-11-15 DIAGNOSIS — Z951 Presence of aortocoronary bypass graft: Secondary | ICD-10-CM | POA: Diagnosis not present

## 2019-11-15 DIAGNOSIS — I472 Ventricular tachycardia: Secondary | ICD-10-CM | POA: Diagnosis present

## 2019-11-15 DIAGNOSIS — I9581 Postprocedural hypotension: Secondary | ICD-10-CM | POA: Diagnosis not present

## 2019-11-15 DIAGNOSIS — G4730 Sleep apnea, unspecified: Secondary | ICD-10-CM | POA: Diagnosis present

## 2019-11-15 DIAGNOSIS — Z6832 Body mass index (BMI) 32.0-32.9, adult: Secondary | ICD-10-CM | POA: Diagnosis not present

## 2019-11-15 DIAGNOSIS — E119 Type 2 diabetes mellitus without complications: Secondary | ICD-10-CM | POA: Diagnosis present

## 2019-11-15 DIAGNOSIS — R079 Chest pain, unspecified: Secondary | ICD-10-CM | POA: Diagnosis not present

## 2019-11-15 DIAGNOSIS — I493 Ventricular premature depolarization: Secondary | ICD-10-CM | POA: Diagnosis present

## 2019-11-15 DIAGNOSIS — I491 Atrial premature depolarization: Secondary | ICD-10-CM | POA: Diagnosis not present

## 2019-11-15 DIAGNOSIS — I255 Ischemic cardiomyopathy: Secondary | ICD-10-CM | POA: Diagnosis present

## 2019-11-15 DIAGNOSIS — I959 Hypotension, unspecified: Secondary | ICD-10-CM | POA: Diagnosis present

## 2019-11-15 DIAGNOSIS — I2510 Atherosclerotic heart disease of native coronary artery without angina pectoris: Secondary | ICD-10-CM | POA: Diagnosis present

## 2019-11-15 DIAGNOSIS — Z539 Procedure and treatment not carried out, unspecified reason: Secondary | ICD-10-CM | POA: Diagnosis present

## 2019-11-15 DIAGNOSIS — I5031 Acute diastolic (congestive) heart failure: Secondary | ICD-10-CM | POA: Diagnosis not present

## 2019-11-15 DIAGNOSIS — I5042 Chronic combined systolic (congestive) and diastolic (congestive) heart failure: Secondary | ICD-10-CM | POA: Diagnosis not present

## 2019-11-15 DIAGNOSIS — N528 Other male erectile dysfunction: Secondary | ICD-10-CM | POA: Diagnosis present

## 2019-11-15 DIAGNOSIS — Z95810 Presence of automatic (implantable) cardiac defibrillator: Secondary | ICD-10-CM | POA: Diagnosis not present

## 2019-11-26 DIAGNOSIS — I472 Ventricular tachycardia: Secondary | ICD-10-CM | POA: Diagnosis not present

## 2019-11-26 DIAGNOSIS — Z4502 Encounter for adjustment and management of automatic implantable cardiac defibrillator: Secondary | ICD-10-CM | POA: Diagnosis not present

## 2019-12-09 DIAGNOSIS — Z95810 Presence of automatic (implantable) cardiac defibrillator: Secondary | ICD-10-CM | POA: Diagnosis not present

## 2019-12-09 DIAGNOSIS — I472 Ventricular tachycardia: Secondary | ICD-10-CM | POA: Diagnosis not present

## 2019-12-17 DIAGNOSIS — M170 Bilateral primary osteoarthritis of knee: Secondary | ICD-10-CM | POA: Diagnosis not present

## 2019-12-17 DIAGNOSIS — I480 Paroxysmal atrial fibrillation: Secondary | ICD-10-CM | POA: Diagnosis not present

## 2019-12-17 DIAGNOSIS — Z7984 Long term (current) use of oral hypoglycemic drugs: Secondary | ICD-10-CM | POA: Diagnosis not present

## 2019-12-17 DIAGNOSIS — I5022 Chronic systolic (congestive) heart failure: Secondary | ICD-10-CM | POA: Diagnosis not present

## 2019-12-17 DIAGNOSIS — E7800 Pure hypercholesterolemia, unspecified: Secondary | ICD-10-CM | POA: Diagnosis not present

## 2019-12-17 DIAGNOSIS — I2510 Atherosclerotic heart disease of native coronary artery without angina pectoris: Secondary | ICD-10-CM | POA: Diagnosis not present

## 2019-12-17 DIAGNOSIS — E1165 Type 2 diabetes mellitus with hyperglycemia: Secondary | ICD-10-CM | POA: Diagnosis not present

## 2019-12-17 DIAGNOSIS — I10 Essential (primary) hypertension: Secondary | ICD-10-CM | POA: Diagnosis not present

## 2019-12-18 DIAGNOSIS — I493 Ventricular premature depolarization: Secondary | ICD-10-CM | POA: Diagnosis not present

## 2019-12-18 DIAGNOSIS — R140 Abdominal distension (gaseous): Secondary | ICD-10-CM | POA: Diagnosis not present

## 2019-12-21 DIAGNOSIS — I110 Hypertensive heart disease with heart failure: Secondary | ICD-10-CM | POA: Diagnosis not present

## 2019-12-21 DIAGNOSIS — I493 Ventricular premature depolarization: Secondary | ICD-10-CM | POA: Diagnosis not present

## 2019-12-21 DIAGNOSIS — I2510 Atherosclerotic heart disease of native coronary artery without angina pectoris: Secondary | ICD-10-CM | POA: Diagnosis not present

## 2019-12-21 DIAGNOSIS — I5020 Unspecified systolic (congestive) heart failure: Secondary | ICD-10-CM | POA: Diagnosis not present

## 2019-12-21 DIAGNOSIS — Z79899 Other long term (current) drug therapy: Secondary | ICD-10-CM | POA: Diagnosis not present

## 2019-12-21 DIAGNOSIS — Z7982 Long term (current) use of aspirin: Secondary | ICD-10-CM | POA: Diagnosis not present

## 2019-12-21 DIAGNOSIS — R140 Abdominal distension (gaseous): Secondary | ICD-10-CM | POA: Diagnosis not present

## 2019-12-21 DIAGNOSIS — R0609 Other forms of dyspnea: Secondary | ICD-10-CM | POA: Diagnosis not present

## 2019-12-28 ENCOUNTER — Other Ambulatory Visit: Payer: Self-pay | Admitting: Adult Health

## 2019-12-28 DIAGNOSIS — B349 Viral infection, unspecified: Secondary | ICD-10-CM | POA: Diagnosis not present

## 2019-12-28 DIAGNOSIS — U071 COVID-19: Secondary | ICD-10-CM

## 2019-12-28 NOTE — Progress Notes (Signed)
  I connected by phone with Bryan Pratt on 12/28/2019 at 5:46 PM to discuss the potential use of an new treatment for mild to moderate COVID-19 viral infection in non-hospitalized patients.  This patient is a 79 y.o. male that meets the FDA criteria for Emergency Use Authorization of bamlanivimab or casirivimab\imdevimab.  Has a (+) direct SARS-CoV-2 viral test result  Has mild or moderate COVID-19   Is ? 79 years of age and weighs ? 40 kg  Is NOT hospitalized due to COVID-19  Is NOT requiring oxygen therapy or requiring an increase in baseline oxygen flow rate due to COVID-19  Is within 10 days of symptom onset  Has at least one of the high risk factor(s) for progression to severe COVID-19 and/or hospitalization as defined in EUA.  Specific high risk criteria : >/= 77 yo   I have spoken and communicated the following to the patient or parent/caregiver:  1. FDA has authorized the emergency use of bamlanivimab and casirivimab\imdevimab for the treatment of mild to moderate COVID-19 in adults and pediatric patients with positive results of direct SARS-CoV-2 viral testing who are 18 years of age and older weighing at least 40 kg, and who are at high risk for progressing to severe COVID-19 and/or hospitalization.  2. The significant known and potential risks and benefits of bamlanivimab and casirivimab\imdevimab, and the extent to which such potential risks and benefits are unknown.  3. Information on available alternative treatments and the risks and benefits of those alternatives, including clinical trials.  4. Patients treated with bamlanivimab and casirivimab\imdevimab should continue to self-isolate and use infection control measures (e.g., wear mask, isolate, social distance, avoid sharing personal items, clean and disinfect "high touch" surfaces, and frequent handwashing) according to CDC guidelines.   5. The patient or parent/caregiver has the option to accept or refuse  bamlanivimab or casirivimab\imdevimab .  After reviewing this information with the patient, The patient agreed to proceed with receiving the bamlanimivab infusion and will be provided a copy of the Fact sheet prior to receiving the infusion.  [name] 12/28/2019 5:46 PM

## 2020-01-01 ENCOUNTER — Ambulatory Visit (HOSPITAL_COMMUNITY)
Admission: RE | Admit: 2020-01-01 | Discharge: 2020-01-01 | Disposition: A | Discharge status: Home / Self Care | Payer: Medicare Other | Source: Ambulatory Visit | Attending: Pulmonary Disease | Admitting: Pulmonary Disease

## 2020-01-01 DIAGNOSIS — U071 COVID-19: Secondary | ICD-10-CM | POA: Diagnosis not present

## 2020-01-01 DIAGNOSIS — Z23 Encounter for immunization: Secondary | ICD-10-CM | POA: Insufficient documentation

## 2020-01-01 MED ORDER — SODIUM CHLORIDE 0.9 % IV SOLN
700.0000 mg | Freq: Once | INTRAVENOUS | Status: AC
  Administered 2020-01-01: 15:00:00 700 mg via INTRAVENOUS
  Filled 2020-01-01: qty 20

## 2020-01-01 MED ORDER — SODIUM CHLORIDE 0.9 % IV SOLN
INTRAVENOUS | Status: DC | PRN
  Administered 2020-01-01: 14:00:00 250 mL via INTRAVENOUS

## 2020-01-01 MED ORDER — EPINEPHRINE 0.3 MG/0.3ML IJ SOAJ: 0.3000 mg | Freq: Once | INTRAMUSCULAR | Status: DC | PRN

## 2020-01-01 MED ORDER — FAMOTIDINE IN NACL 20-0.9 MG/50ML-% IV SOLN: 20.0000 mg | Freq: Once | INTRAVENOUS | Status: DC | PRN

## 2020-01-01 MED ORDER — DIPHENHYDRAMINE HCL 50 MG/ML IJ SOLN: 50.0000 mg | Freq: Once | INTRAMUSCULAR | Status: DC | PRN

## 2020-01-01 MED ORDER — METHYLPREDNISOLONE SODIUM SUCC 125 MG IJ SOLR: 125.0000 mg | Freq: Once | INTRAMUSCULAR | Status: DC | PRN

## 2020-01-01 MED ORDER — ALBUTEROL SULFATE HFA 108 (90 BASE) MCG/ACT IN AERS: 2.0000 | INHALATION_SPRAY | Freq: Once | RESPIRATORY_TRACT | Status: DC | PRN

## 2020-01-01 NOTE — Progress Notes (Signed)
  Diagnosis: COVID-19  Physician: Dr Joya Gaskins   Procedure: [facility] Med: bamlanivimab infusion - Provided patient with bamlanimivab fact sheet for patients, parents and caregivers prior to infusion.  Complications: No immediate complications noted.  Discharge: Discharged home   Bryan Pratt 01/01/2020

## 2020-01-01 NOTE — Discharge Instructions (Signed)

## 2020-01-14 DIAGNOSIS — I4891 Unspecified atrial fibrillation: Secondary | ICD-10-CM | POA: Diagnosis not present

## 2020-01-14 DIAGNOSIS — I440 Atrioventricular block, first degree: Secondary | ICD-10-CM | POA: Diagnosis not present

## 2020-01-14 DIAGNOSIS — Z4502 Encounter for adjustment and management of automatic implantable cardiac defibrillator: Secondary | ICD-10-CM | POA: Diagnosis not present

## 2020-01-14 DIAGNOSIS — R0600 Dyspnea, unspecified: Secondary | ICD-10-CM | POA: Diagnosis not present

## 2020-01-14 DIAGNOSIS — Z6832 Body mass index (BMI) 32.0-32.9, adult: Secondary | ICD-10-CM | POA: Diagnosis not present

## 2020-01-14 DIAGNOSIS — Z9889 Other specified postprocedural states: Secondary | ICD-10-CM | POA: Diagnosis not present

## 2020-01-14 DIAGNOSIS — R140 Abdominal distension (gaseous): Secondary | ICD-10-CM | POA: Diagnosis not present

## 2020-01-14 DIAGNOSIS — I493 Ventricular premature depolarization: Secondary | ICD-10-CM | POA: Diagnosis not present

## 2020-01-14 DIAGNOSIS — I255 Ischemic cardiomyopathy: Secondary | ICD-10-CM | POA: Diagnosis not present

## 2020-01-14 DIAGNOSIS — I10 Essential (primary) hypertension: Secondary | ICD-10-CM | POA: Diagnosis not present

## 2020-01-14 DIAGNOSIS — Z79899 Other long term (current) drug therapy: Secondary | ICD-10-CM | POA: Diagnosis not present

## 2020-01-15 DIAGNOSIS — H353222 Exudative age-related macular degeneration, left eye, with inactive choroidal neovascularization: Secondary | ICD-10-CM | POA: Diagnosis not present

## 2020-01-15 DIAGNOSIS — H353213 Exudative age-related macular degeneration, right eye, with inactive scar: Secondary | ICD-10-CM | POA: Diagnosis not present

## 2020-01-20 DIAGNOSIS — I493 Ventricular premature depolarization: Secondary | ICD-10-CM | POA: Diagnosis not present

## 2020-01-20 DIAGNOSIS — I440 Atrioventricular block, first degree: Secondary | ICD-10-CM | POA: Diagnosis not present

## 2020-03-05 DIAGNOSIS — I10 Essential (primary) hypertension: Secondary | ICD-10-CM | POA: Diagnosis not present

## 2020-03-05 DIAGNOSIS — E119 Type 2 diabetes mellitus without complications: Secondary | ICD-10-CM | POA: Diagnosis not present

## 2020-03-05 DIAGNOSIS — I5022 Chronic systolic (congestive) heart failure: Secondary | ICD-10-CM | POA: Diagnosis not present

## 2020-03-05 DIAGNOSIS — E7800 Pure hypercholesterolemia, unspecified: Secondary | ICD-10-CM | POA: Diagnosis not present

## 2020-03-05 DIAGNOSIS — I2510 Atherosclerotic heart disease of native coronary artery without angina pectoris: Secondary | ICD-10-CM | POA: Diagnosis not present

## 2020-03-05 DIAGNOSIS — E1165 Type 2 diabetes mellitus with hyperglycemia: Secondary | ICD-10-CM | POA: Diagnosis not present

## 2020-03-05 DIAGNOSIS — I480 Paroxysmal atrial fibrillation: Secondary | ICD-10-CM | POA: Diagnosis not present

## 2020-03-05 DIAGNOSIS — E785 Hyperlipidemia, unspecified: Secondary | ICD-10-CM | POA: Diagnosis not present

## 2020-03-05 DIAGNOSIS — M170 Bilateral primary osteoarthritis of knee: Secondary | ICD-10-CM | POA: Diagnosis not present

## 2020-03-11 DIAGNOSIS — H35373 Puckering of macula, bilateral: Secondary | ICD-10-CM | POA: Diagnosis not present

## 2020-03-11 DIAGNOSIS — H353222 Exudative age-related macular degeneration, left eye, with inactive choroidal neovascularization: Secondary | ICD-10-CM | POA: Diagnosis not present

## 2020-03-11 DIAGNOSIS — H353213 Exudative age-related macular degeneration, right eye, with inactive scar: Secondary | ICD-10-CM | POA: Diagnosis not present

## 2020-03-11 DIAGNOSIS — E113292 Type 2 diabetes mellitus with mild nonproliferative diabetic retinopathy without macular edema, left eye: Secondary | ICD-10-CM | POA: Diagnosis not present

## 2020-03-20 DIAGNOSIS — E119 Type 2 diabetes mellitus without complications: Secondary | ICD-10-CM | POA: Diagnosis not present

## 2020-04-01 DIAGNOSIS — I480 Paroxysmal atrial fibrillation: Secondary | ICD-10-CM | POA: Diagnosis not present

## 2020-04-01 DIAGNOSIS — I10 Essential (primary) hypertension: Secondary | ICD-10-CM | POA: Diagnosis not present

## 2020-04-01 DIAGNOSIS — E119 Type 2 diabetes mellitus without complications: Secondary | ICD-10-CM | POA: Diagnosis not present

## 2020-04-01 DIAGNOSIS — E1165 Type 2 diabetes mellitus with hyperglycemia: Secondary | ICD-10-CM | POA: Diagnosis not present

## 2020-04-01 DIAGNOSIS — I2510 Atherosclerotic heart disease of native coronary artery without angina pectoris: Secondary | ICD-10-CM | POA: Diagnosis not present

## 2020-04-01 DIAGNOSIS — M170 Bilateral primary osteoarthritis of knee: Secondary | ICD-10-CM | POA: Diagnosis not present

## 2020-04-01 DIAGNOSIS — E785 Hyperlipidemia, unspecified: Secondary | ICD-10-CM | POA: Diagnosis not present

## 2020-04-01 DIAGNOSIS — I5022 Chronic systolic (congestive) heart failure: Secondary | ICD-10-CM | POA: Diagnosis not present

## 2020-04-01 DIAGNOSIS — E7800 Pure hypercholesterolemia, unspecified: Secondary | ICD-10-CM | POA: Diagnosis not present

## 2020-05-09 DIAGNOSIS — R0602 Shortness of breath: Secondary | ICD-10-CM | POA: Diagnosis not present

## 2020-05-09 DIAGNOSIS — Z03818 Encounter for observation for suspected exposure to other biological agents ruled out: Secondary | ICD-10-CM | POA: Diagnosis not present

## 2020-05-09 DIAGNOSIS — R0982 Postnasal drip: Secondary | ICD-10-CM | POA: Diagnosis not present

## 2020-05-09 DIAGNOSIS — R509 Fever, unspecified: Secondary | ICD-10-CM | POA: Diagnosis not present

## 2020-05-12 DIAGNOSIS — Z79899 Other long term (current) drug therapy: Secondary | ICD-10-CM | POA: Diagnosis not present

## 2020-05-12 DIAGNOSIS — Z9889 Other specified postprocedural states: Secondary | ICD-10-CM | POA: Diagnosis not present

## 2020-05-12 DIAGNOSIS — Z95810 Presence of automatic (implantable) cardiac defibrillator: Secondary | ICD-10-CM | POA: Diagnosis not present

## 2020-05-12 DIAGNOSIS — I472 Ventricular tachycardia: Secondary | ICD-10-CM | POA: Diagnosis not present

## 2020-05-12 DIAGNOSIS — I255 Ischemic cardiomyopathy: Secondary | ICD-10-CM | POA: Diagnosis not present

## 2020-05-20 DIAGNOSIS — I493 Ventricular premature depolarization: Secondary | ICD-10-CM | POA: Diagnosis not present

## 2020-05-20 DIAGNOSIS — I440 Atrioventricular block, first degree: Secondary | ICD-10-CM | POA: Diagnosis not present

## 2020-06-16 DIAGNOSIS — I10 Essential (primary) hypertension: Secondary | ICD-10-CM | POA: Diagnosis not present

## 2020-06-16 DIAGNOSIS — E1165 Type 2 diabetes mellitus with hyperglycemia: Secondary | ICD-10-CM | POA: Diagnosis not present

## 2020-06-16 DIAGNOSIS — E7800 Pure hypercholesterolemia, unspecified: Secondary | ICD-10-CM | POA: Diagnosis not present

## 2020-06-16 DIAGNOSIS — M545 Low back pain: Secondary | ICD-10-CM | POA: Diagnosis not present

## 2020-06-16 DIAGNOSIS — G8929 Other chronic pain: Secondary | ICD-10-CM | POA: Diagnosis not present

## 2020-06-16 DIAGNOSIS — I2510 Atherosclerotic heart disease of native coronary artery without angina pectoris: Secondary | ICD-10-CM | POA: Diagnosis not present

## 2020-06-16 DIAGNOSIS — I480 Paroxysmal atrial fibrillation: Secondary | ICD-10-CM | POA: Diagnosis not present

## 2020-06-16 DIAGNOSIS — Z125 Encounter for screening for malignant neoplasm of prostate: Secondary | ICD-10-CM | POA: Diagnosis not present

## 2020-06-16 DIAGNOSIS — I5022 Chronic systolic (congestive) heart failure: Secondary | ICD-10-CM | POA: Diagnosis not present

## 2020-06-19 ENCOUNTER — Other Ambulatory Visit: Payer: Self-pay

## 2020-06-19 ENCOUNTER — Ambulatory Visit
Admission: RE | Admit: 2020-06-19 | Discharge: 2020-06-19 | Disposition: A | Discharge status: Home / Self Care | Payer: Medicare Other | Source: Ambulatory Visit | Attending: Family Medicine | Admitting: Family Medicine

## 2020-06-19 ENCOUNTER — Other Ambulatory Visit: Payer: Self-pay | Admitting: Family Medicine

## 2020-06-19 DIAGNOSIS — M47816 Spondylosis without myelopathy or radiculopathy, lumbar region: Secondary | ICD-10-CM | POA: Diagnosis not present

## 2020-06-19 DIAGNOSIS — M549 Dorsalgia, unspecified: Secondary | ICD-10-CM | POA: Diagnosis not present

## 2020-06-19 DIAGNOSIS — I700 Atherosclerosis of aorta: Secondary | ICD-10-CM | POA: Diagnosis not present

## 2020-06-19 DIAGNOSIS — M545 Low back pain, unspecified: Secondary | ICD-10-CM

## 2020-06-19 DIAGNOSIS — Z950 Presence of cardiac pacemaker: Secondary | ICD-10-CM | POA: Diagnosis not present

## 2020-06-24 DIAGNOSIS — I493 Ventricular premature depolarization: Secondary | ICD-10-CM | POA: Diagnosis not present

## 2020-06-24 DIAGNOSIS — I4891 Unspecified atrial fibrillation: Secondary | ICD-10-CM | POA: Diagnosis not present

## 2020-06-24 DIAGNOSIS — I471 Supraventricular tachycardia: Secondary | ICD-10-CM | POA: Diagnosis not present

## 2020-06-24 DIAGNOSIS — R140 Abdominal distension (gaseous): Secondary | ICD-10-CM | POA: Diagnosis not present

## 2020-07-02 DIAGNOSIS — M1711 Unilateral primary osteoarthritis, right knee: Secondary | ICD-10-CM | POA: Diagnosis not present

## 2020-07-09 DIAGNOSIS — M1711 Unilateral primary osteoarthritis, right knee: Secondary | ICD-10-CM | POA: Diagnosis not present

## 2020-07-16 DIAGNOSIS — M1711 Unilateral primary osteoarthritis, right knee: Secondary | ICD-10-CM | POA: Diagnosis not present

## 2020-07-16 DIAGNOSIS — E113292 Type 2 diabetes mellitus with mild nonproliferative diabetic retinopathy without macular edema, left eye: Secondary | ICD-10-CM | POA: Diagnosis not present

## 2020-07-16 DIAGNOSIS — H353213 Exudative age-related macular degeneration, right eye, with inactive scar: Secondary | ICD-10-CM | POA: Diagnosis not present

## 2020-07-16 DIAGNOSIS — H35373 Puckering of macula, bilateral: Secondary | ICD-10-CM | POA: Diagnosis not present

## 2020-07-16 DIAGNOSIS — H353222 Exudative age-related macular degeneration, left eye, with inactive choroidal neovascularization: Secondary | ICD-10-CM | POA: Diagnosis not present

## 2020-08-12 DIAGNOSIS — I472 Ventricular tachycardia: Secondary | ICD-10-CM | POA: Diagnosis not present

## 2020-08-12 DIAGNOSIS — Z95810 Presence of automatic (implantable) cardiac defibrillator: Secondary | ICD-10-CM | POA: Diagnosis not present

## 2020-08-12 DIAGNOSIS — Z4502 Encounter for adjustment and management of automatic implantable cardiac defibrillator: Secondary | ICD-10-CM | POA: Diagnosis not present

## 2020-08-28 DIAGNOSIS — M5431 Sciatica, right side: Secondary | ICD-10-CM | POA: Diagnosis not present

## 2020-08-28 DIAGNOSIS — M9902 Segmental and somatic dysfunction of thoracic region: Secondary | ICD-10-CM | POA: Diagnosis not present

## 2020-08-28 DIAGNOSIS — M9903 Segmental and somatic dysfunction of lumbar region: Secondary | ICD-10-CM | POA: Diagnosis not present

## 2020-08-28 DIAGNOSIS — M9905 Segmental and somatic dysfunction of pelvic region: Secondary | ICD-10-CM | POA: Diagnosis not present

## 2020-09-01 DIAGNOSIS — M5431 Sciatica, right side: Secondary | ICD-10-CM | POA: Diagnosis not present

## 2020-09-01 DIAGNOSIS — M9902 Segmental and somatic dysfunction of thoracic region: Secondary | ICD-10-CM | POA: Diagnosis not present

## 2020-09-01 DIAGNOSIS — M9903 Segmental and somatic dysfunction of lumbar region: Secondary | ICD-10-CM | POA: Diagnosis not present

## 2020-09-01 DIAGNOSIS — M9905 Segmental and somatic dysfunction of pelvic region: Secondary | ICD-10-CM | POA: Diagnosis not present

## 2020-09-04 DIAGNOSIS — M9905 Segmental and somatic dysfunction of pelvic region: Secondary | ICD-10-CM | POA: Diagnosis not present

## 2020-09-04 DIAGNOSIS — M9902 Segmental and somatic dysfunction of thoracic region: Secondary | ICD-10-CM | POA: Diagnosis not present

## 2020-09-04 DIAGNOSIS — M9903 Segmental and somatic dysfunction of lumbar region: Secondary | ICD-10-CM | POA: Diagnosis not present

## 2020-09-04 DIAGNOSIS — M5431 Sciatica, right side: Secondary | ICD-10-CM | POA: Diagnosis not present

## 2020-09-08 DIAGNOSIS — M9902 Segmental and somatic dysfunction of thoracic region: Secondary | ICD-10-CM | POA: Diagnosis not present

## 2020-09-08 DIAGNOSIS — M5431 Sciatica, right side: Secondary | ICD-10-CM | POA: Diagnosis not present

## 2020-09-08 DIAGNOSIS — M9905 Segmental and somatic dysfunction of pelvic region: Secondary | ICD-10-CM | POA: Diagnosis not present

## 2020-09-08 DIAGNOSIS — M9903 Segmental and somatic dysfunction of lumbar region: Secondary | ICD-10-CM | POA: Diagnosis not present

## 2020-09-10 DIAGNOSIS — H35373 Puckering of macula, bilateral: Secondary | ICD-10-CM | POA: Diagnosis not present

## 2020-09-10 DIAGNOSIS — H353222 Exudative age-related macular degeneration, left eye, with inactive choroidal neovascularization: Secondary | ICD-10-CM | POA: Diagnosis not present

## 2020-09-10 DIAGNOSIS — H353213 Exudative age-related macular degeneration, right eye, with inactive scar: Secondary | ICD-10-CM | POA: Diagnosis not present

## 2020-09-10 DIAGNOSIS — E113292 Type 2 diabetes mellitus with mild nonproliferative diabetic retinopathy without macular edema, left eye: Secondary | ICD-10-CM | POA: Diagnosis not present

## 2020-09-11 DIAGNOSIS — M9905 Segmental and somatic dysfunction of pelvic region: Secondary | ICD-10-CM | POA: Diagnosis not present

## 2020-09-11 DIAGNOSIS — M9902 Segmental and somatic dysfunction of thoracic region: Secondary | ICD-10-CM | POA: Diagnosis not present

## 2020-09-11 DIAGNOSIS — M9903 Segmental and somatic dysfunction of lumbar region: Secondary | ICD-10-CM | POA: Diagnosis not present

## 2020-09-11 DIAGNOSIS — M5431 Sciatica, right side: Secondary | ICD-10-CM | POA: Diagnosis not present

## 2020-09-15 DIAGNOSIS — M9903 Segmental and somatic dysfunction of lumbar region: Secondary | ICD-10-CM | POA: Diagnosis not present

## 2020-09-15 DIAGNOSIS — M9902 Segmental and somatic dysfunction of thoracic region: Secondary | ICD-10-CM | POA: Diagnosis not present

## 2020-09-15 DIAGNOSIS — M9905 Segmental and somatic dysfunction of pelvic region: Secondary | ICD-10-CM | POA: Diagnosis not present

## 2020-09-15 DIAGNOSIS — M5431 Sciatica, right side: Secondary | ICD-10-CM | POA: Diagnosis not present

## 2020-09-18 DIAGNOSIS — M9905 Segmental and somatic dysfunction of pelvic region: Secondary | ICD-10-CM | POA: Diagnosis not present

## 2020-09-18 DIAGNOSIS — M5431 Sciatica, right side: Secondary | ICD-10-CM | POA: Diagnosis not present

## 2020-09-18 DIAGNOSIS — M9903 Segmental and somatic dysfunction of lumbar region: Secondary | ICD-10-CM | POA: Diagnosis not present

## 2020-09-18 DIAGNOSIS — M9902 Segmental and somatic dysfunction of thoracic region: Secondary | ICD-10-CM | POA: Diagnosis not present

## 2020-09-22 DIAGNOSIS — M5431 Sciatica, right side: Secondary | ICD-10-CM | POA: Diagnosis not present

## 2020-09-22 DIAGNOSIS — M9903 Segmental and somatic dysfunction of lumbar region: Secondary | ICD-10-CM | POA: Diagnosis not present

## 2020-09-22 DIAGNOSIS — M9905 Segmental and somatic dysfunction of pelvic region: Secondary | ICD-10-CM | POA: Diagnosis not present

## 2020-09-22 DIAGNOSIS — M9902 Segmental and somatic dysfunction of thoracic region: Secondary | ICD-10-CM | POA: Diagnosis not present

## 2020-09-23 DIAGNOSIS — I10 Essential (primary) hypertension: Secondary | ICD-10-CM | POA: Diagnosis not present

## 2020-09-23 DIAGNOSIS — Z23 Encounter for immunization: Secondary | ICD-10-CM | POA: Diagnosis not present

## 2020-09-23 DIAGNOSIS — E7800 Pure hypercholesterolemia, unspecified: Secondary | ICD-10-CM | POA: Diagnosis not present

## 2020-09-23 DIAGNOSIS — E1165 Type 2 diabetes mellitus with hyperglycemia: Secondary | ICD-10-CM | POA: Diagnosis not present

## 2020-09-23 DIAGNOSIS — I2510 Atherosclerotic heart disease of native coronary artery without angina pectoris: Secondary | ICD-10-CM | POA: Diagnosis not present

## 2020-09-25 DIAGNOSIS — M9903 Segmental and somatic dysfunction of lumbar region: Secondary | ICD-10-CM | POA: Diagnosis not present

## 2020-09-25 DIAGNOSIS — M9902 Segmental and somatic dysfunction of thoracic region: Secondary | ICD-10-CM | POA: Diagnosis not present

## 2020-09-25 DIAGNOSIS — M5431 Sciatica, right side: Secondary | ICD-10-CM | POA: Diagnosis not present

## 2020-09-25 DIAGNOSIS — M9905 Segmental and somatic dysfunction of pelvic region: Secondary | ICD-10-CM | POA: Diagnosis not present

## 2020-09-29 DIAGNOSIS — M9905 Segmental and somatic dysfunction of pelvic region: Secondary | ICD-10-CM | POA: Diagnosis not present

## 2020-09-29 DIAGNOSIS — M5431 Sciatica, right side: Secondary | ICD-10-CM | POA: Diagnosis not present

## 2020-09-29 DIAGNOSIS — M9903 Segmental and somatic dysfunction of lumbar region: Secondary | ICD-10-CM | POA: Diagnosis not present

## 2020-09-29 DIAGNOSIS — M9902 Segmental and somatic dysfunction of thoracic region: Secondary | ICD-10-CM | POA: Diagnosis not present

## 2020-10-02 DIAGNOSIS — M5431 Sciatica, right side: Secondary | ICD-10-CM | POA: Diagnosis not present

## 2020-10-02 DIAGNOSIS — M9902 Segmental and somatic dysfunction of thoracic region: Secondary | ICD-10-CM | POA: Diagnosis not present

## 2020-10-02 DIAGNOSIS — M546 Pain in thoracic spine: Secondary | ICD-10-CM | POA: Diagnosis not present

## 2020-10-02 DIAGNOSIS — M9903 Segmental and somatic dysfunction of lumbar region: Secondary | ICD-10-CM | POA: Diagnosis not present

## 2020-10-02 DIAGNOSIS — M9905 Segmental and somatic dysfunction of pelvic region: Secondary | ICD-10-CM | POA: Diagnosis not present

## 2020-10-02 DIAGNOSIS — M5459 Other low back pain: Secondary | ICD-10-CM | POA: Diagnosis not present

## 2020-10-06 DIAGNOSIS — M9903 Segmental and somatic dysfunction of lumbar region: Secondary | ICD-10-CM | POA: Diagnosis not present

## 2020-10-06 DIAGNOSIS — M546 Pain in thoracic spine: Secondary | ICD-10-CM | POA: Diagnosis not present

## 2020-10-06 DIAGNOSIS — M5459 Other low back pain: Secondary | ICD-10-CM | POA: Diagnosis not present

## 2020-10-06 DIAGNOSIS — M5431 Sciatica, right side: Secondary | ICD-10-CM | POA: Diagnosis not present

## 2020-10-06 DIAGNOSIS — M9902 Segmental and somatic dysfunction of thoracic region: Secondary | ICD-10-CM | POA: Diagnosis not present

## 2020-10-06 DIAGNOSIS — M9905 Segmental and somatic dysfunction of pelvic region: Secondary | ICD-10-CM | POA: Diagnosis not present

## 2020-10-13 DIAGNOSIS — I5020 Unspecified systolic (congestive) heart failure: Secondary | ICD-10-CM | POA: Diagnosis not present

## 2020-10-13 DIAGNOSIS — I493 Ventricular premature depolarization: Secondary | ICD-10-CM | POA: Diagnosis not present

## 2020-10-13 DIAGNOSIS — I472 Ventricular tachycardia: Secondary | ICD-10-CM | POA: Diagnosis not present

## 2020-10-13 DIAGNOSIS — I440 Atrioventricular block, first degree: Secondary | ICD-10-CM | POA: Diagnosis not present

## 2020-10-13 DIAGNOSIS — I4891 Unspecified atrial fibrillation: Secondary | ICD-10-CM | POA: Diagnosis not present

## 2020-10-13 DIAGNOSIS — Z4502 Encounter for adjustment and management of automatic implantable cardiac defibrillator: Secondary | ICD-10-CM | POA: Diagnosis not present

## 2020-10-13 DIAGNOSIS — I255 Ischemic cardiomyopathy: Secondary | ICD-10-CM | POA: Diagnosis not present

## 2020-10-13 DIAGNOSIS — Z79899 Other long term (current) drug therapy: Secondary | ICD-10-CM | POA: Diagnosis not present

## 2020-10-13 DIAGNOSIS — R0600 Dyspnea, unspecified: Secondary | ICD-10-CM | POA: Diagnosis not present

## 2020-10-13 DIAGNOSIS — I10 Essential (primary) hypertension: Secondary | ICD-10-CM | POA: Diagnosis not present

## 2020-10-13 DIAGNOSIS — Z95810 Presence of automatic (implantable) cardiac defibrillator: Secondary | ICD-10-CM | POA: Diagnosis not present

## 2020-10-15 DIAGNOSIS — M5431 Sciatica, right side: Secondary | ICD-10-CM | POA: Diagnosis not present

## 2020-10-15 DIAGNOSIS — M9902 Segmental and somatic dysfunction of thoracic region: Secondary | ICD-10-CM | POA: Diagnosis not present

## 2020-10-15 DIAGNOSIS — M9905 Segmental and somatic dysfunction of pelvic region: Secondary | ICD-10-CM | POA: Diagnosis not present

## 2020-10-15 DIAGNOSIS — M9903 Segmental and somatic dysfunction of lumbar region: Secondary | ICD-10-CM | POA: Diagnosis not present

## 2020-10-15 DIAGNOSIS — M5459 Other low back pain: Secondary | ICD-10-CM | POA: Diagnosis not present

## 2020-10-15 DIAGNOSIS — M546 Pain in thoracic spine: Secondary | ICD-10-CM | POA: Diagnosis not present

## 2020-10-17 DIAGNOSIS — I440 Atrioventricular block, first degree: Secondary | ICD-10-CM | POA: Diagnosis not present

## 2020-10-17 DIAGNOSIS — I493 Ventricular premature depolarization: Secondary | ICD-10-CM | POA: Diagnosis not present

## 2020-10-17 DIAGNOSIS — I517 Cardiomegaly: Secondary | ICD-10-CM | POA: Diagnosis not present

## 2020-10-27 DIAGNOSIS — M9905 Segmental and somatic dysfunction of pelvic region: Secondary | ICD-10-CM | POA: Diagnosis not present

## 2020-10-27 DIAGNOSIS — M9903 Segmental and somatic dysfunction of lumbar region: Secondary | ICD-10-CM | POA: Diagnosis not present

## 2020-10-27 DIAGNOSIS — M546 Pain in thoracic spine: Secondary | ICD-10-CM | POA: Diagnosis not present

## 2020-10-27 DIAGNOSIS — M9902 Segmental and somatic dysfunction of thoracic region: Secondary | ICD-10-CM | POA: Diagnosis not present

## 2020-10-27 DIAGNOSIS — M5431 Sciatica, right side: Secondary | ICD-10-CM | POA: Diagnosis not present

## 2020-10-27 DIAGNOSIS — M5459 Other low back pain: Secondary | ICD-10-CM | POA: Diagnosis not present

## 2020-11-05 DIAGNOSIS — H35373 Puckering of macula, bilateral: Secondary | ICD-10-CM | POA: Diagnosis not present

## 2020-11-05 DIAGNOSIS — E113292 Type 2 diabetes mellitus with mild nonproliferative diabetic retinopathy without macular edema, left eye: Secondary | ICD-10-CM | POA: Diagnosis not present

## 2020-11-05 DIAGNOSIS — H353213 Exudative age-related macular degeneration, right eye, with inactive scar: Secondary | ICD-10-CM | POA: Diagnosis not present

## 2020-11-05 DIAGNOSIS — H353222 Exudative age-related macular degeneration, left eye, with inactive choroidal neovascularization: Secondary | ICD-10-CM | POA: Diagnosis not present

## 2020-11-11 DIAGNOSIS — I472 Ventricular tachycardia: Secondary | ICD-10-CM | POA: Diagnosis not present

## 2020-11-11 DIAGNOSIS — Z4502 Encounter for adjustment and management of automatic implantable cardiac defibrillator: Secondary | ICD-10-CM | POA: Diagnosis not present

## 2020-11-24 DIAGNOSIS — M5459 Other low back pain: Secondary | ICD-10-CM | POA: Diagnosis not present

## 2020-11-24 DIAGNOSIS — M9903 Segmental and somatic dysfunction of lumbar region: Secondary | ICD-10-CM | POA: Diagnosis not present

## 2020-11-24 DIAGNOSIS — M9902 Segmental and somatic dysfunction of thoracic region: Secondary | ICD-10-CM | POA: Diagnosis not present

## 2020-11-24 DIAGNOSIS — M546 Pain in thoracic spine: Secondary | ICD-10-CM | POA: Diagnosis not present

## 2020-11-24 DIAGNOSIS — M5431 Sciatica, right side: Secondary | ICD-10-CM | POA: Diagnosis not present

## 2020-11-24 DIAGNOSIS — M9905 Segmental and somatic dysfunction of pelvic region: Secondary | ICD-10-CM | POA: Diagnosis not present

## 2020-12-23 DIAGNOSIS — I519 Heart disease, unspecified: Secondary | ICD-10-CM | POA: Diagnosis not present

## 2020-12-23 DIAGNOSIS — I5030 Unspecified diastolic (congestive) heart failure: Secondary | ICD-10-CM | POA: Diagnosis not present

## 2020-12-23 DIAGNOSIS — I517 Cardiomegaly: Secondary | ICD-10-CM | POA: Diagnosis not present

## 2020-12-29 DIAGNOSIS — M9905 Segmental and somatic dysfunction of pelvic region: Secondary | ICD-10-CM | POA: Diagnosis not present

## 2020-12-29 DIAGNOSIS — M5431 Sciatica, right side: Secondary | ICD-10-CM | POA: Diagnosis not present

## 2020-12-29 DIAGNOSIS — M5459 Other low back pain: Secondary | ICD-10-CM | POA: Diagnosis not present

## 2020-12-29 DIAGNOSIS — M546 Pain in thoracic spine: Secondary | ICD-10-CM | POA: Diagnosis not present

## 2020-12-29 DIAGNOSIS — M9903 Segmental and somatic dysfunction of lumbar region: Secondary | ICD-10-CM | POA: Diagnosis not present

## 2020-12-29 DIAGNOSIS — M9902 Segmental and somatic dysfunction of thoracic region: Secondary | ICD-10-CM | POA: Diagnosis not present

## 2021-01-07 DIAGNOSIS — H353213 Exudative age-related macular degeneration, right eye, with inactive scar: Secondary | ICD-10-CM | POA: Diagnosis not present

## 2021-01-07 DIAGNOSIS — H353222 Exudative age-related macular degeneration, left eye, with inactive choroidal neovascularization: Secondary | ICD-10-CM | POA: Diagnosis not present

## 2021-01-28 DIAGNOSIS — M5431 Sciatica, right side: Secondary | ICD-10-CM | POA: Diagnosis not present

## 2021-01-28 DIAGNOSIS — M546 Pain in thoracic spine: Secondary | ICD-10-CM | POA: Diagnosis not present

## 2021-01-28 DIAGNOSIS — M9902 Segmental and somatic dysfunction of thoracic region: Secondary | ICD-10-CM | POA: Diagnosis not present

## 2021-01-28 DIAGNOSIS — M9905 Segmental and somatic dysfunction of pelvic region: Secondary | ICD-10-CM | POA: Diagnosis not present

## 2021-01-28 DIAGNOSIS — M9903 Segmental and somatic dysfunction of lumbar region: Secondary | ICD-10-CM | POA: Diagnosis not present

## 2021-01-28 DIAGNOSIS — M5459 Other low back pain: Secondary | ICD-10-CM | POA: Diagnosis not present

## 2021-02-10 DIAGNOSIS — I472 Ventricular tachycardia: Secondary | ICD-10-CM | POA: Diagnosis not present

## 2021-02-10 DIAGNOSIS — Z45010 Encounter for checking and testing of cardiac pacemaker pulse generator [battery]: Secondary | ICD-10-CM | POA: Diagnosis not present

## 2021-02-19 DIAGNOSIS — M5431 Sciatica, right side: Secondary | ICD-10-CM | POA: Diagnosis not present

## 2021-02-19 DIAGNOSIS — M546 Pain in thoracic spine: Secondary | ICD-10-CM | POA: Diagnosis not present

## 2021-02-19 DIAGNOSIS — M9905 Segmental and somatic dysfunction of pelvic region: Secondary | ICD-10-CM | POA: Diagnosis not present

## 2021-02-19 DIAGNOSIS — M9902 Segmental and somatic dysfunction of thoracic region: Secondary | ICD-10-CM | POA: Diagnosis not present

## 2021-02-19 DIAGNOSIS — M9903 Segmental and somatic dysfunction of lumbar region: Secondary | ICD-10-CM | POA: Diagnosis not present

## 2021-02-19 DIAGNOSIS — M5459 Other low back pain: Secondary | ICD-10-CM | POA: Diagnosis not present

## 2021-02-20 DIAGNOSIS — I493 Ventricular premature depolarization: Secondary | ICD-10-CM | POA: Diagnosis not present

## 2021-02-20 DIAGNOSIS — I5020 Unspecified systolic (congestive) heart failure: Secondary | ICD-10-CM | POA: Diagnosis not present

## 2021-02-20 DIAGNOSIS — I2510 Atherosclerotic heart disease of native coronary artery without angina pectoris: Secondary | ICD-10-CM | POA: Diagnosis not present

## 2021-02-20 DIAGNOSIS — Z9889 Other specified postprocedural states: Secondary | ICD-10-CM | POA: Diagnosis not present

## 2021-02-20 DIAGNOSIS — Z136 Encounter for screening for cardiovascular disorders: Secondary | ICD-10-CM | POA: Diagnosis not present

## 2021-02-20 DIAGNOSIS — R0600 Dyspnea, unspecified: Secondary | ICD-10-CM | POA: Diagnosis not present

## 2021-02-20 DIAGNOSIS — Z79899 Other long term (current) drug therapy: Secondary | ICD-10-CM | POA: Diagnosis not present

## 2021-02-20 DIAGNOSIS — Z7982 Long term (current) use of aspirin: Secondary | ICD-10-CM | POA: Diagnosis not present

## 2021-02-23 DIAGNOSIS — M546 Pain in thoracic spine: Secondary | ICD-10-CM | POA: Diagnosis not present

## 2021-02-23 DIAGNOSIS — M9902 Segmental and somatic dysfunction of thoracic region: Secondary | ICD-10-CM | POA: Diagnosis not present

## 2021-02-23 DIAGNOSIS — M9905 Segmental and somatic dysfunction of pelvic region: Secondary | ICD-10-CM | POA: Diagnosis not present

## 2021-02-23 DIAGNOSIS — M9903 Segmental and somatic dysfunction of lumbar region: Secondary | ICD-10-CM | POA: Diagnosis not present

## 2021-02-23 DIAGNOSIS — M5431 Sciatica, right side: Secondary | ICD-10-CM | POA: Diagnosis not present

## 2021-02-23 DIAGNOSIS — M5459 Other low back pain: Secondary | ICD-10-CM | POA: Diagnosis not present

## 2021-03-02 DIAGNOSIS — R0600 Dyspnea, unspecified: Secondary | ICD-10-CM | POA: Diagnosis not present

## 2021-03-02 DIAGNOSIS — Z136 Encounter for screening for cardiovascular disorders: Secondary | ICD-10-CM | POA: Diagnosis not present

## 2021-03-04 DIAGNOSIS — H353213 Exudative age-related macular degeneration, right eye, with inactive scar: Secondary | ICD-10-CM | POA: Diagnosis not present

## 2021-03-04 DIAGNOSIS — H353112 Nonexudative age-related macular degeneration, right eye, intermediate dry stage: Secondary | ICD-10-CM | POA: Diagnosis not present

## 2021-03-04 DIAGNOSIS — H353221 Exudative age-related macular degeneration, left eye, with active choroidal neovascularization: Secondary | ICD-10-CM | POA: Diagnosis not present

## 2021-03-04 DIAGNOSIS — H353222 Exudative age-related macular degeneration, left eye, with inactive choroidal neovascularization: Secondary | ICD-10-CM | POA: Diagnosis not present

## 2021-03-04 DIAGNOSIS — E113292 Type 2 diabetes mellitus with mild nonproliferative diabetic retinopathy without macular edema, left eye: Secondary | ICD-10-CM | POA: Diagnosis not present

## 2021-03-04 DIAGNOSIS — H43813 Vitreous degeneration, bilateral: Secondary | ICD-10-CM | POA: Diagnosis not present

## 2021-03-04 DIAGNOSIS — H35373 Puckering of macula, bilateral: Secondary | ICD-10-CM | POA: Diagnosis not present

## 2021-03-09 DIAGNOSIS — R0609 Other forms of dyspnea: Secondary | ICD-10-CM | POA: Diagnosis not present

## 2021-03-10 DIAGNOSIS — R942 Abnormal results of pulmonary function studies: Secondary | ICD-10-CM | POA: Diagnosis not present

## 2021-03-23 DIAGNOSIS — M9902 Segmental and somatic dysfunction of thoracic region: Secondary | ICD-10-CM | POA: Diagnosis not present

## 2021-03-23 DIAGNOSIS — M546 Pain in thoracic spine: Secondary | ICD-10-CM | POA: Diagnosis not present

## 2021-03-23 DIAGNOSIS — M5459 Other low back pain: Secondary | ICD-10-CM | POA: Diagnosis not present

## 2021-03-23 DIAGNOSIS — M9905 Segmental and somatic dysfunction of pelvic region: Secondary | ICD-10-CM | POA: Diagnosis not present

## 2021-03-23 DIAGNOSIS — M5431 Sciatica, right side: Secondary | ICD-10-CM | POA: Diagnosis not present

## 2021-03-23 DIAGNOSIS — M9903 Segmental and somatic dysfunction of lumbar region: Secondary | ICD-10-CM | POA: Diagnosis not present

## 2021-03-24 DIAGNOSIS — E1165 Type 2 diabetes mellitus with hyperglycemia: Secondary | ICD-10-CM | POA: Diagnosis not present

## 2021-03-24 DIAGNOSIS — R5383 Other fatigue: Secondary | ICD-10-CM | POA: Diagnosis not present

## 2021-03-24 DIAGNOSIS — G8929 Other chronic pain: Secondary | ICD-10-CM | POA: Diagnosis not present

## 2021-03-24 DIAGNOSIS — I10 Essential (primary) hypertension: Secondary | ICD-10-CM | POA: Diagnosis not present

## 2021-03-24 DIAGNOSIS — I480 Paroxysmal atrial fibrillation: Secondary | ICD-10-CM | POA: Diagnosis not present

## 2021-03-24 DIAGNOSIS — I2510 Atherosclerotic heart disease of native coronary artery without angina pectoris: Secondary | ICD-10-CM | POA: Diagnosis not present

## 2021-03-24 DIAGNOSIS — I5022 Chronic systolic (congestive) heart failure: Secondary | ICD-10-CM | POA: Diagnosis not present

## 2021-03-24 DIAGNOSIS — E7800 Pure hypercholesterolemia, unspecified: Secondary | ICD-10-CM | POA: Diagnosis not present

## 2021-04-13 DIAGNOSIS — Z4502 Encounter for adjustment and management of automatic implantable cardiac defibrillator: Secondary | ICD-10-CM | POA: Diagnosis not present

## 2021-04-13 DIAGNOSIS — I440 Atrioventricular block, first degree: Secondary | ICD-10-CM | POA: Diagnosis not present

## 2021-04-13 DIAGNOSIS — I472 Ventricular tachycardia: Secondary | ICD-10-CM | POA: Diagnosis not present

## 2021-04-13 DIAGNOSIS — I5020 Unspecified systolic (congestive) heart failure: Secondary | ICD-10-CM | POA: Diagnosis not present

## 2021-04-13 DIAGNOSIS — Z885 Allergy status to narcotic agent status: Secondary | ICD-10-CM | POA: Diagnosis not present

## 2021-04-13 DIAGNOSIS — I493 Ventricular premature depolarization: Secondary | ICD-10-CM | POA: Diagnosis not present

## 2021-04-13 DIAGNOSIS — Z95810 Presence of automatic (implantable) cardiac defibrillator: Secondary | ICD-10-CM | POA: Diagnosis not present

## 2021-04-13 DIAGNOSIS — I4891 Unspecified atrial fibrillation: Secondary | ICD-10-CM | POA: Diagnosis not present

## 2021-04-13 DIAGNOSIS — Z951 Presence of aortocoronary bypass graft: Secondary | ICD-10-CM | POA: Diagnosis not present

## 2021-04-13 DIAGNOSIS — Z79899 Other long term (current) drug therapy: Secondary | ICD-10-CM | POA: Diagnosis not present

## 2021-04-20 DIAGNOSIS — M5431 Sciatica, right side: Secondary | ICD-10-CM | POA: Diagnosis not present

## 2021-04-20 DIAGNOSIS — M9902 Segmental and somatic dysfunction of thoracic region: Secondary | ICD-10-CM | POA: Diagnosis not present

## 2021-04-20 DIAGNOSIS — M546 Pain in thoracic spine: Secondary | ICD-10-CM | POA: Diagnosis not present

## 2021-04-20 DIAGNOSIS — M5459 Other low back pain: Secondary | ICD-10-CM | POA: Diagnosis not present

## 2021-04-20 DIAGNOSIS — M9905 Segmental and somatic dysfunction of pelvic region: Secondary | ICD-10-CM | POA: Diagnosis not present

## 2021-04-20 DIAGNOSIS — M9903 Segmental and somatic dysfunction of lumbar region: Secondary | ICD-10-CM | POA: Diagnosis not present

## 2021-04-27 DIAGNOSIS — M9905 Segmental and somatic dysfunction of pelvic region: Secondary | ICD-10-CM | POA: Diagnosis not present

## 2021-04-27 DIAGNOSIS — M546 Pain in thoracic spine: Secondary | ICD-10-CM | POA: Diagnosis not present

## 2021-04-27 DIAGNOSIS — M9902 Segmental and somatic dysfunction of thoracic region: Secondary | ICD-10-CM | POA: Diagnosis not present

## 2021-04-27 DIAGNOSIS — M5431 Sciatica, right side: Secondary | ICD-10-CM | POA: Diagnosis not present

## 2021-04-27 DIAGNOSIS — M5459 Other low back pain: Secondary | ICD-10-CM | POA: Diagnosis not present

## 2021-04-27 DIAGNOSIS — M9903 Segmental and somatic dysfunction of lumbar region: Secondary | ICD-10-CM | POA: Diagnosis not present

## 2021-04-29 DIAGNOSIS — H353222 Exudative age-related macular degeneration, left eye, with inactive choroidal neovascularization: Secondary | ICD-10-CM | POA: Diagnosis not present

## 2021-04-29 DIAGNOSIS — H353213 Exudative age-related macular degeneration, right eye, with inactive scar: Secondary | ICD-10-CM | POA: Diagnosis not present

## 2021-04-29 DIAGNOSIS — H43813 Vitreous degeneration, bilateral: Secondary | ICD-10-CM | POA: Diagnosis not present

## 2021-05-02 DIAGNOSIS — R42 Dizziness and giddiness: Secondary | ICD-10-CM | POA: Diagnosis not present

## 2021-05-02 DIAGNOSIS — R0600 Dyspnea, unspecified: Secondary | ICD-10-CM | POA: Diagnosis not present

## 2021-05-02 DIAGNOSIS — I440 Atrioventricular block, first degree: Secondary | ICD-10-CM | POA: Diagnosis not present

## 2021-05-02 DIAGNOSIS — R0602 Shortness of breath: Secondary | ICD-10-CM | POA: Diagnosis not present

## 2021-05-02 DIAGNOSIS — Z20822 Contact with and (suspected) exposure to covid-19: Secondary | ICD-10-CM | POA: Diagnosis not present

## 2021-05-02 DIAGNOSIS — R0989 Other specified symptoms and signs involving the circulatory and respiratory systems: Secondary | ICD-10-CM | POA: Diagnosis not present

## 2021-05-02 DIAGNOSIS — I517 Cardiomegaly: Secondary | ICD-10-CM | POA: Diagnosis not present

## 2021-05-02 DIAGNOSIS — R001 Bradycardia, unspecified: Secondary | ICD-10-CM | POA: Diagnosis not present

## 2021-05-02 DIAGNOSIS — R051 Acute cough: Secondary | ICD-10-CM | POA: Diagnosis not present

## 2021-05-02 DIAGNOSIS — J811 Chronic pulmonary edema: Secondary | ICD-10-CM | POA: Diagnosis not present

## 2021-05-02 DIAGNOSIS — R531 Weakness: Secondary | ICD-10-CM | POA: Diagnosis not present

## 2021-05-02 DIAGNOSIS — R0902 Hypoxemia: Secondary | ICD-10-CM | POA: Diagnosis not present

## 2021-05-02 DIAGNOSIS — R600 Localized edema: Secondary | ICD-10-CM | POA: Diagnosis not present

## 2021-05-02 DIAGNOSIS — J069 Acute upper respiratory infection, unspecified: Secondary | ICD-10-CM | POA: Diagnosis not present

## 2021-05-02 DIAGNOSIS — R778 Other specified abnormalities of plasma proteins: Secondary | ICD-10-CM | POA: Diagnosis not present

## 2021-05-02 DIAGNOSIS — R791 Abnormal coagulation profile: Secondary | ICD-10-CM | POA: Diagnosis not present

## 2021-05-03 DIAGNOSIS — I440 Atrioventricular block, first degree: Secondary | ICD-10-CM | POA: Diagnosis not present

## 2021-05-03 DIAGNOSIS — I493 Ventricular premature depolarization: Secondary | ICD-10-CM | POA: Diagnosis not present

## 2021-05-06 DIAGNOSIS — M9905 Segmental and somatic dysfunction of pelvic region: Secondary | ICD-10-CM | POA: Diagnosis not present

## 2021-05-06 DIAGNOSIS — M9901 Segmental and somatic dysfunction of cervical region: Secondary | ICD-10-CM | POA: Diagnosis not present

## 2021-05-06 DIAGNOSIS — M531 Cervicobrachial syndrome: Secondary | ICD-10-CM | POA: Diagnosis not present

## 2021-05-06 DIAGNOSIS — M9903 Segmental and somatic dysfunction of lumbar region: Secondary | ICD-10-CM | POA: Diagnosis not present

## 2021-05-07 DIAGNOSIS — M9901 Segmental and somatic dysfunction of cervical region: Secondary | ICD-10-CM | POA: Diagnosis not present

## 2021-05-07 DIAGNOSIS — M531 Cervicobrachial syndrome: Secondary | ICD-10-CM | POA: Diagnosis not present

## 2021-05-07 DIAGNOSIS — M9903 Segmental and somatic dysfunction of lumbar region: Secondary | ICD-10-CM | POA: Diagnosis not present

## 2021-05-07 DIAGNOSIS — M9905 Segmental and somatic dysfunction of pelvic region: Secondary | ICD-10-CM | POA: Diagnosis not present

## 2021-05-08 DIAGNOSIS — M9903 Segmental and somatic dysfunction of lumbar region: Secondary | ICD-10-CM | POA: Diagnosis not present

## 2021-05-08 DIAGNOSIS — M531 Cervicobrachial syndrome: Secondary | ICD-10-CM | POA: Diagnosis not present

## 2021-05-08 DIAGNOSIS — M9901 Segmental and somatic dysfunction of cervical region: Secondary | ICD-10-CM | POA: Diagnosis not present

## 2021-05-08 DIAGNOSIS — M9905 Segmental and somatic dysfunction of pelvic region: Secondary | ICD-10-CM | POA: Diagnosis not present

## 2021-05-11 DIAGNOSIS — M9905 Segmental and somatic dysfunction of pelvic region: Secondary | ICD-10-CM | POA: Diagnosis not present

## 2021-05-11 DIAGNOSIS — M9901 Segmental and somatic dysfunction of cervical region: Secondary | ICD-10-CM | POA: Diagnosis not present

## 2021-05-11 DIAGNOSIS — M531 Cervicobrachial syndrome: Secondary | ICD-10-CM | POA: Diagnosis not present

## 2021-05-11 DIAGNOSIS — M9903 Segmental and somatic dysfunction of lumbar region: Secondary | ICD-10-CM | POA: Diagnosis not present

## 2021-05-12 DIAGNOSIS — Z95810 Presence of automatic (implantable) cardiac defibrillator: Secondary | ICD-10-CM | POA: Diagnosis not present

## 2021-05-12 DIAGNOSIS — Z7984 Long term (current) use of oral hypoglycemic drugs: Secondary | ICD-10-CM | POA: Diagnosis not present

## 2021-05-12 DIAGNOSIS — I2510 Atherosclerotic heart disease of native coronary artery without angina pectoris: Secondary | ICD-10-CM | POA: Diagnosis not present

## 2021-05-12 DIAGNOSIS — E669 Obesity, unspecified: Secondary | ICD-10-CM | POA: Diagnosis not present

## 2021-05-12 DIAGNOSIS — Z6832 Body mass index (BMI) 32.0-32.9, adult: Secondary | ICD-10-CM | POA: Diagnosis not present

## 2021-05-12 DIAGNOSIS — E119 Type 2 diabetes mellitus without complications: Secondary | ICD-10-CM | POA: Diagnosis not present

## 2021-05-12 DIAGNOSIS — Z79899 Other long term (current) drug therapy: Secondary | ICD-10-CM | POA: Diagnosis not present

## 2021-05-12 DIAGNOSIS — R5383 Other fatigue: Secondary | ICD-10-CM | POA: Diagnosis not present

## 2021-05-12 DIAGNOSIS — E6609 Other obesity due to excess calories: Secondary | ICD-10-CM | POA: Diagnosis not present

## 2021-05-12 DIAGNOSIS — I5020 Unspecified systolic (congestive) heart failure: Secondary | ICD-10-CM | POA: Diagnosis not present

## 2021-05-12 DIAGNOSIS — Z7982 Long term (current) use of aspirin: Secondary | ICD-10-CM | POA: Diagnosis not present

## 2021-05-12 DIAGNOSIS — I493 Ventricular premature depolarization: Secondary | ICD-10-CM | POA: Diagnosis not present

## 2021-05-12 DIAGNOSIS — I472 Ventricular tachycardia: Secondary | ICD-10-CM | POA: Diagnosis not present

## 2021-05-12 DIAGNOSIS — Z6833 Body mass index (BMI) 33.0-33.9, adult: Secondary | ICD-10-CM | POA: Diagnosis not present

## 2021-05-12 DIAGNOSIS — Z4502 Encounter for adjustment and management of automatic implantable cardiac defibrillator: Secondary | ICD-10-CM | POA: Diagnosis not present

## 2021-05-14 DIAGNOSIS — M531 Cervicobrachial syndrome: Secondary | ICD-10-CM | POA: Diagnosis not present

## 2021-05-14 DIAGNOSIS — M9903 Segmental and somatic dysfunction of lumbar region: Secondary | ICD-10-CM | POA: Diagnosis not present

## 2021-05-14 DIAGNOSIS — M9901 Segmental and somatic dysfunction of cervical region: Secondary | ICD-10-CM | POA: Diagnosis not present

## 2021-05-14 DIAGNOSIS — M9905 Segmental and somatic dysfunction of pelvic region: Secondary | ICD-10-CM | POA: Diagnosis not present

## 2021-05-14 DIAGNOSIS — I472 Ventricular tachycardia: Secondary | ICD-10-CM | POA: Diagnosis not present

## 2021-05-14 DIAGNOSIS — Z4502 Encounter for adjustment and management of automatic implantable cardiac defibrillator: Secondary | ICD-10-CM | POA: Diagnosis not present

## 2021-05-15 DIAGNOSIS — E1165 Type 2 diabetes mellitus with hyperglycemia: Secondary | ICD-10-CM | POA: Diagnosis not present

## 2021-05-15 DIAGNOSIS — I10 Essential (primary) hypertension: Secondary | ICD-10-CM | POA: Diagnosis not present

## 2021-05-15 DIAGNOSIS — I480 Paroxysmal atrial fibrillation: Secondary | ICD-10-CM | POA: Diagnosis not present

## 2021-05-18 DIAGNOSIS — M9901 Segmental and somatic dysfunction of cervical region: Secondary | ICD-10-CM | POA: Diagnosis not present

## 2021-05-18 DIAGNOSIS — M9903 Segmental and somatic dysfunction of lumbar region: Secondary | ICD-10-CM | POA: Diagnosis not present

## 2021-05-18 DIAGNOSIS — M9905 Segmental and somatic dysfunction of pelvic region: Secondary | ICD-10-CM | POA: Diagnosis not present

## 2021-05-18 DIAGNOSIS — M531 Cervicobrachial syndrome: Secondary | ICD-10-CM | POA: Diagnosis not present

## 2021-05-21 DIAGNOSIS — M9901 Segmental and somatic dysfunction of cervical region: Secondary | ICD-10-CM | POA: Diagnosis not present

## 2021-05-21 DIAGNOSIS — M531 Cervicobrachial syndrome: Secondary | ICD-10-CM | POA: Diagnosis not present

## 2021-05-21 DIAGNOSIS — M9903 Segmental and somatic dysfunction of lumbar region: Secondary | ICD-10-CM | POA: Diagnosis not present

## 2021-05-21 DIAGNOSIS — M9905 Segmental and somatic dysfunction of pelvic region: Secondary | ICD-10-CM | POA: Diagnosis not present

## 2021-05-25 DIAGNOSIS — M9903 Segmental and somatic dysfunction of lumbar region: Secondary | ICD-10-CM | POA: Diagnosis not present

## 2021-05-25 DIAGNOSIS — M531 Cervicobrachial syndrome: Secondary | ICD-10-CM | POA: Diagnosis not present

## 2021-05-25 DIAGNOSIS — M9901 Segmental and somatic dysfunction of cervical region: Secondary | ICD-10-CM | POA: Diagnosis not present

## 2021-05-25 DIAGNOSIS — M9905 Segmental and somatic dysfunction of pelvic region: Secondary | ICD-10-CM | POA: Diagnosis not present

## 2021-05-28 DIAGNOSIS — M531 Cervicobrachial syndrome: Secondary | ICD-10-CM | POA: Diagnosis not present

## 2021-05-28 DIAGNOSIS — M9901 Segmental and somatic dysfunction of cervical region: Secondary | ICD-10-CM | POA: Diagnosis not present

## 2021-05-28 DIAGNOSIS — M9903 Segmental and somatic dysfunction of lumbar region: Secondary | ICD-10-CM | POA: Diagnosis not present

## 2021-05-28 DIAGNOSIS — M9905 Segmental and somatic dysfunction of pelvic region: Secondary | ICD-10-CM | POA: Diagnosis not present

## 2021-06-01 DIAGNOSIS — M531 Cervicobrachial syndrome: Secondary | ICD-10-CM | POA: Diagnosis not present

## 2021-06-01 DIAGNOSIS — M9901 Segmental and somatic dysfunction of cervical region: Secondary | ICD-10-CM | POA: Diagnosis not present

## 2021-06-01 DIAGNOSIS — M9905 Segmental and somatic dysfunction of pelvic region: Secondary | ICD-10-CM | POA: Diagnosis not present

## 2021-06-01 DIAGNOSIS — M9903 Segmental and somatic dysfunction of lumbar region: Secondary | ICD-10-CM | POA: Diagnosis not present

## 2021-06-05 DIAGNOSIS — I493 Ventricular premature depolarization: Secondary | ICD-10-CM | POA: Diagnosis not present

## 2021-06-10 DIAGNOSIS — M9905 Segmental and somatic dysfunction of pelvic region: Secondary | ICD-10-CM | POA: Diagnosis not present

## 2021-06-10 DIAGNOSIS — M9903 Segmental and somatic dysfunction of lumbar region: Secondary | ICD-10-CM | POA: Diagnosis not present

## 2021-06-10 DIAGNOSIS — M531 Cervicobrachial syndrome: Secondary | ICD-10-CM | POA: Diagnosis not present

## 2021-06-10 DIAGNOSIS — M9901 Segmental and somatic dysfunction of cervical region: Secondary | ICD-10-CM | POA: Diagnosis not present

## 2021-06-13 DIAGNOSIS — I493 Ventricular premature depolarization: Secondary | ICD-10-CM | POA: Diagnosis not present

## 2021-06-13 DIAGNOSIS — I472 Ventricular tachycardia: Secondary | ICD-10-CM | POA: Diagnosis not present

## 2021-06-16 DIAGNOSIS — U071 COVID-19: Secondary | ICD-10-CM | POA: Diagnosis not present

## 2021-06-22 DIAGNOSIS — M531 Cervicobrachial syndrome: Secondary | ICD-10-CM | POA: Diagnosis not present

## 2021-06-22 DIAGNOSIS — M9901 Segmental and somatic dysfunction of cervical region: Secondary | ICD-10-CM | POA: Diagnosis not present

## 2021-06-22 DIAGNOSIS — M9905 Segmental and somatic dysfunction of pelvic region: Secondary | ICD-10-CM | POA: Diagnosis not present

## 2021-06-22 DIAGNOSIS — M9903 Segmental and somatic dysfunction of lumbar region: Secondary | ICD-10-CM | POA: Diagnosis not present

## 2021-06-23 DIAGNOSIS — I10 Essential (primary) hypertension: Secondary | ICD-10-CM | POA: Diagnosis not present

## 2021-06-23 DIAGNOSIS — E1165 Type 2 diabetes mellitus with hyperglycemia: Secondary | ICD-10-CM | POA: Diagnosis not present

## 2021-06-23 DIAGNOSIS — E7800 Pure hypercholesterolemia, unspecified: Secondary | ICD-10-CM | POA: Diagnosis not present

## 2021-06-23 DIAGNOSIS — U071 COVID-19: Secondary | ICD-10-CM | POA: Diagnosis not present

## 2021-07-01 DIAGNOSIS — H353213 Exudative age-related macular degeneration, right eye, with inactive scar: Secondary | ICD-10-CM | POA: Diagnosis not present

## 2021-07-01 DIAGNOSIS — E113292 Type 2 diabetes mellitus with mild nonproliferative diabetic retinopathy without macular edema, left eye: Secondary | ICD-10-CM | POA: Diagnosis not present

## 2021-07-01 DIAGNOSIS — H353222 Exudative age-related macular degeneration, left eye, with inactive choroidal neovascularization: Secondary | ICD-10-CM | POA: Diagnosis not present

## 2021-07-01 DIAGNOSIS — H35431 Paving stone degeneration of retina, right eye: Secondary | ICD-10-CM | POA: Diagnosis not present

## 2021-07-02 DIAGNOSIS — H26491 Other secondary cataract, right eye: Secondary | ICD-10-CM | POA: Diagnosis not present

## 2021-07-02 DIAGNOSIS — H353213 Exudative age-related macular degeneration, right eye, with inactive scar: Secondary | ICD-10-CM | POA: Diagnosis not present

## 2021-07-02 DIAGNOSIS — Z961 Presence of intraocular lens: Secondary | ICD-10-CM | POA: Diagnosis not present

## 2021-07-02 DIAGNOSIS — H353221 Exudative age-related macular degeneration, left eye, with active choroidal neovascularization: Secondary | ICD-10-CM | POA: Diagnosis not present

## 2021-07-02 DIAGNOSIS — E119 Type 2 diabetes mellitus without complications: Secondary | ICD-10-CM | POA: Diagnosis not present

## 2021-07-06 DIAGNOSIS — M531 Cervicobrachial syndrome: Secondary | ICD-10-CM | POA: Diagnosis not present

## 2021-07-06 DIAGNOSIS — M9903 Segmental and somatic dysfunction of lumbar region: Secondary | ICD-10-CM | POA: Diagnosis not present

## 2021-07-06 DIAGNOSIS — M9901 Segmental and somatic dysfunction of cervical region: Secondary | ICD-10-CM | POA: Diagnosis not present

## 2021-07-06 DIAGNOSIS — M9905 Segmental and somatic dysfunction of pelvic region: Secondary | ICD-10-CM | POA: Diagnosis not present

## 2021-07-20 DIAGNOSIS — M531 Cervicobrachial syndrome: Secondary | ICD-10-CM | POA: Diagnosis not present

## 2021-07-20 DIAGNOSIS — M9901 Segmental and somatic dysfunction of cervical region: Secondary | ICD-10-CM | POA: Diagnosis not present

## 2021-07-20 DIAGNOSIS — M9903 Segmental and somatic dysfunction of lumbar region: Secondary | ICD-10-CM | POA: Diagnosis not present

## 2021-07-20 DIAGNOSIS — M9905 Segmental and somatic dysfunction of pelvic region: Secondary | ICD-10-CM | POA: Diagnosis not present

## 2021-07-27 DIAGNOSIS — M9903 Segmental and somatic dysfunction of lumbar region: Secondary | ICD-10-CM | POA: Diagnosis not present

## 2021-07-27 DIAGNOSIS — M9905 Segmental and somatic dysfunction of pelvic region: Secondary | ICD-10-CM | POA: Diagnosis not present

## 2021-07-27 DIAGNOSIS — M531 Cervicobrachial syndrome: Secondary | ICD-10-CM | POA: Diagnosis not present

## 2021-07-27 DIAGNOSIS — M9901 Segmental and somatic dysfunction of cervical region: Secondary | ICD-10-CM | POA: Diagnosis not present

## 2021-08-03 DIAGNOSIS — M9905 Segmental and somatic dysfunction of pelvic region: Secondary | ICD-10-CM | POA: Diagnosis not present

## 2021-08-03 DIAGNOSIS — M9901 Segmental and somatic dysfunction of cervical region: Secondary | ICD-10-CM | POA: Diagnosis not present

## 2021-08-03 DIAGNOSIS — M9903 Segmental and somatic dysfunction of lumbar region: Secondary | ICD-10-CM | POA: Diagnosis not present

## 2021-08-03 DIAGNOSIS — M531 Cervicobrachial syndrome: Secondary | ICD-10-CM | POA: Diagnosis not present

## 2021-08-11 DIAGNOSIS — I5020 Unspecified systolic (congestive) heart failure: Secondary | ICD-10-CM | POA: Diagnosis not present

## 2021-08-13 DIAGNOSIS — I472 Ventricular tachycardia: Secondary | ICD-10-CM | POA: Diagnosis not present

## 2021-08-13 DIAGNOSIS — Z4502 Encounter for adjustment and management of automatic implantable cardiac defibrillator: Secondary | ICD-10-CM | POA: Diagnosis not present

## 2021-08-17 DIAGNOSIS — M1711 Unilateral primary osteoarthritis, right knee: Secondary | ICD-10-CM | POA: Diagnosis not present

## 2021-08-26 DIAGNOSIS — M9901 Segmental and somatic dysfunction of cervical region: Secondary | ICD-10-CM | POA: Diagnosis not present

## 2021-08-26 DIAGNOSIS — E1165 Type 2 diabetes mellitus with hyperglycemia: Secondary | ICD-10-CM | POA: Diagnosis not present

## 2021-08-26 DIAGNOSIS — M9905 Segmental and somatic dysfunction of pelvic region: Secondary | ICD-10-CM | POA: Diagnosis not present

## 2021-08-26 DIAGNOSIS — M531 Cervicobrachial syndrome: Secondary | ICD-10-CM | POA: Diagnosis not present

## 2021-08-26 DIAGNOSIS — M9903 Segmental and somatic dysfunction of lumbar region: Secondary | ICD-10-CM | POA: Diagnosis not present

## 2021-09-01 DIAGNOSIS — I5020 Unspecified systolic (congestive) heart failure: Secondary | ICD-10-CM | POA: Diagnosis not present

## 2021-09-01 DIAGNOSIS — E782 Mixed hyperlipidemia: Secondary | ICD-10-CM | POA: Diagnosis not present

## 2021-09-01 DIAGNOSIS — I2510 Atherosclerotic heart disease of native coronary artery without angina pectoris: Secondary | ICD-10-CM | POA: Diagnosis not present

## 2021-09-01 DIAGNOSIS — Z951 Presence of aortocoronary bypass graft: Secondary | ICD-10-CM | POA: Diagnosis not present

## 2021-09-01 DIAGNOSIS — Z79899 Other long term (current) drug therapy: Secondary | ICD-10-CM | POA: Diagnosis not present

## 2021-09-15 DIAGNOSIS — R0981 Nasal congestion: Secondary | ICD-10-CM | POA: Diagnosis not present

## 2021-09-15 DIAGNOSIS — R5383 Other fatigue: Secondary | ICD-10-CM | POA: Diagnosis not present

## 2021-09-15 DIAGNOSIS — J029 Acute pharyngitis, unspecified: Secondary | ICD-10-CM | POA: Diagnosis not present

## 2021-09-15 DIAGNOSIS — R059 Cough, unspecified: Secondary | ICD-10-CM | POA: Diagnosis not present

## 2021-09-15 DIAGNOSIS — B349 Viral infection, unspecified: Secondary | ICD-10-CM | POA: Diagnosis not present

## 2021-09-15 DIAGNOSIS — R509 Fever, unspecified: Secondary | ICD-10-CM | POA: Diagnosis not present

## 2021-09-15 DIAGNOSIS — Z20822 Contact with and (suspected) exposure to covid-19: Secondary | ICD-10-CM | POA: Diagnosis not present

## 2021-09-16 NOTE — Progress Notes (Signed)
Please enter orders for PAT visit scheduled for 09-29-21

## 2021-09-18 DIAGNOSIS — R52 Pain, unspecified: Secondary | ICD-10-CM | POA: Diagnosis not present

## 2021-09-18 DIAGNOSIS — J029 Acute pharyngitis, unspecified: Secondary | ICD-10-CM | POA: Diagnosis not present

## 2021-09-18 DIAGNOSIS — Z03818 Encounter for observation for suspected exposure to other biological agents ruled out: Secondary | ICD-10-CM | POA: Diagnosis not present

## 2021-09-18 DIAGNOSIS — R509 Fever, unspecified: Secondary | ICD-10-CM | POA: Diagnosis not present

## 2021-09-18 DIAGNOSIS — R0981 Nasal congestion: Secondary | ICD-10-CM | POA: Diagnosis not present

## 2021-09-21 DIAGNOSIS — R5383 Other fatigue: Secondary | ICD-10-CM | POA: Diagnosis not present

## 2021-09-21 DIAGNOSIS — R051 Acute cough: Secondary | ICD-10-CM | POA: Diagnosis not present

## 2021-09-21 DIAGNOSIS — R509 Fever, unspecified: Secondary | ICD-10-CM | POA: Diagnosis not present

## 2021-09-21 DIAGNOSIS — I252 Old myocardial infarction: Secondary | ICD-10-CM | POA: Diagnosis not present

## 2021-09-21 DIAGNOSIS — I493 Ventricular premature depolarization: Secondary | ICD-10-CM | POA: Diagnosis not present

## 2021-09-21 DIAGNOSIS — J90 Pleural effusion, not elsewhere classified: Secondary | ICD-10-CM | POA: Diagnosis not present

## 2021-09-21 DIAGNOSIS — I440 Atrioventricular block, first degree: Secondary | ICD-10-CM | POA: Diagnosis not present

## 2021-09-21 DIAGNOSIS — R1084 Generalized abdominal pain: Secondary | ICD-10-CM | POA: Diagnosis not present

## 2021-09-21 DIAGNOSIS — R7989 Other specified abnormal findings of blood chemistry: Secondary | ICD-10-CM | POA: Diagnosis not present

## 2021-09-21 DIAGNOSIS — Z20822 Contact with and (suspected) exposure to covid-19: Secondary | ICD-10-CM | POA: Diagnosis not present

## 2021-09-21 DIAGNOSIS — M7989 Other specified soft tissue disorders: Secondary | ICD-10-CM | POA: Diagnosis not present

## 2021-09-21 DIAGNOSIS — I517 Cardiomegaly: Secondary | ICD-10-CM | POA: Diagnosis not present

## 2021-09-21 DIAGNOSIS — I959 Hypotension, unspecified: Secondary | ICD-10-CM | POA: Diagnosis not present

## 2021-09-21 DIAGNOSIS — J029 Acute pharyngitis, unspecified: Secondary | ICD-10-CM | POA: Diagnosis not present

## 2021-09-21 DIAGNOSIS — R059 Cough, unspecified: Secondary | ICD-10-CM | POA: Diagnosis not present

## 2021-09-21 DIAGNOSIS — R0602 Shortness of breath: Secondary | ICD-10-CM | POA: Diagnosis not present

## 2021-09-24 DIAGNOSIS — M7989 Other specified soft tissue disorders: Secondary | ICD-10-CM | POA: Diagnosis not present

## 2021-09-27 DIAGNOSIS — I493 Ventricular premature depolarization: Secondary | ICD-10-CM | POA: Diagnosis not present

## 2021-09-27 DIAGNOSIS — I440 Atrioventricular block, first degree: Secondary | ICD-10-CM | POA: Diagnosis not present

## 2021-09-29 ENCOUNTER — Encounter (HOSPITAL_COMMUNITY): Admission: RE | Admit: 2021-09-29 | Payer: Medicare Other | Source: Ambulatory Visit

## 2021-10-08 DIAGNOSIS — M9905 Segmental and somatic dysfunction of pelvic region: Secondary | ICD-10-CM | POA: Diagnosis not present

## 2021-10-08 DIAGNOSIS — M9901 Segmental and somatic dysfunction of cervical region: Secondary | ICD-10-CM | POA: Diagnosis not present

## 2021-10-08 DIAGNOSIS — M531 Cervicobrachial syndrome: Secondary | ICD-10-CM | POA: Diagnosis not present

## 2021-10-08 DIAGNOSIS — M9903 Segmental and somatic dysfunction of lumbar region: Secondary | ICD-10-CM | POA: Diagnosis not present

## 2021-10-15 NOTE — Progress Notes (Addendum)
Anesthesia Review:  PCP: Cardiologist : Chest x-ray : EKG : Echo : Stress test: Cardiac Cath :  Activity level:  Sleep Study/ CPAP : Fasting Blood Sugar :      / Checks Blood Sugar -- times a day:   DM- type  Hgba1c-10/16/21-  Blood Thinner/ Instructions /Last Dose: ASA / Instructions/ Last Dose :   81 MG Aspirin  In ED on 10/17 with cough.  Covid negative.

## 2021-10-15 NOTE — Progress Notes (Signed)
DUE TO COVID-19 ONLY ONE VISITOR IS ALLOWED TO COME WITH YOU AND STAY IN THE WAITING ROOM ONLY DURING PRE OP AND PROCEDURE DAY OF SURGERY.  2 VISITOR  MAY VISIT WITH YOU AFTER SURGERY IN YOUR PRIVATE ROOM DURING VISITING HOURS ONLY!  YOU NEED TO HAVE A COVID 19 TEST ON_  10/26/21 @_  @_from  8am-3pm _____, THIS TEST MUST BE DONE BEFORE SURGERY,  Covid test is done at [place], [place] [address].  This is a drive thru.  No appt required. Please see map.                 Your procedure is scheduled on:  10/28/21   Report to [facility] Main  Entrance   Report to admitting at     0515am      Call this number if you have problems the morning of surgery [phone]    REMEMBER: NO  SOLID FOOD CANDY OR GUM AFTER MIDNIGHT. CLEAR LIQUIDS UNTIL    0430am         . NOTHING BY MOUTH EXCEPT CLEAR LIQUIDS UNTIL  0430am   . PLEASE FINISH  G2 Lower Sugar  DRINK PER SURGEON ORDER  WHICH NEEDS TO BE COMPLETED AT  0430am     .      CLEAR LIQUID DIET   Foods Allowed                                                                    Coffee and tea, regular and decaf                            Fruit ices (not with fruit pulp)                                      Iced Popsicles                                    Carbonated beverages, regular and diet                                    Cranberry, grape and apple juices Sports drinks like Gatorade Lightly seasoned clear broth or consume(fat free) Sugar,  ___________________________________________________________________      BRUSH YOUR TEETH MORNING OF SURGERY AND RINSE YOUR MOUTH OUT, NO CHEWING GUM CANDY OR MINTS.     Take these medicines the morning of surgery with A SIP OF WATER:  lipitor, zantac, eye drops as usual   DO NOT TAKE ANY DIABETIC MEDICATIONS DAY OF YOUR SURGERY                               You may not have any metal on your body including hair pins and              piercings  Do not wear  jewelry, make-up,  lotions, powders or perfumes, deodorant             Do not wear nail polish on your fingernails.  Do not shave  48 hours prior to surgery.              Men may shave face and neck.   Do not bring valuables to the hospital. [place].  Contacts, dentures or bridgework may not be worn into surgery.  Leave suitcase in the car. After surgery it may be brought to your room.     Patients discharged the day of surgery will not be allowed to drive home. IF YOU ARE HAVING SURGERY AND GOING HOME THE SAME DAY, YOU MUST HAVE AN ADULT TO DRIVE YOU HOME AND BE WITH YOU FOR 24 HOURS. YOU MAY GO HOME BY TAXI OR UBER OR ORTHERWISE, BUT AN ADULT MUST ACCOMPANY YOU HOME AND STAY WITH YOU FOR 24 HOURS.  Name and phone number of your driver:  Special Instructions: N/A              Please read over the following fact sheets you were given: _____________________________________________________________________  [facility] - Preparing for Surgery Before surgery, you can play an important role.  Because skin is not sterile, your skin needs to be as free of germs as possible.  You can reduce the number of germs on your skin by washing with CHG (chlorahexidine gluconate) soap before surgery.  CHG is an antiseptic cleaner which kills germs and bonds with the skin to continue killing germs even after washing. Please DO NOT use if you have an allergy to CHG or antibacterial soaps.  If your skin becomes reddened/irritated stop using the CHG and inform your nurse when you arrive at Short Stay. Do not shave (including legs and underarms) for at least 48 hours prior to the first CHG shower.  You may shave your face/neck. Please follow these instructions carefully:  1.  Shower with CHG Soap the night before surgery and the  morning of Surgery.  2.  If you choose to wash your hair, wash your hair first as usual with your  normal  shampoo.  3.  After you shampoo,  rinse your hair and body thoroughly to remove the  shampoo.                           4.  Use CHG as you would any other liquid soap.  You can apply chg directly  to the skin and wash                       Gently with a scrungie or clean washcloth.  5.  Apply the CHG Soap to your body ONLY FROM THE NECK DOWN.   Do not use on face/ open                           Wound or open sores. Avoid contact with eyes, ears mouth and genitals (private parts).                       Wash face,  Genitals (private parts) with your normal soap.             6.  Wash thoroughly, paying special attention to  the area where your surgery  will be performed.  7.  Thoroughly rinse your body with warm water from the neck down.  8.  DO NOT shower/wash with your normal soap after using and rinsing off  the CHG Soap.                9.  Pat yourself dry with a clean towel.            10.  Wear clean pajamas.            11.  Place clean sheets on your bed the night of your first shower and do not  sleep with pets. Day of Surgery : Do not apply any lotions/deodorants the morning of surgery.  Please wear clean clothes to the hospital/surgery center.  FAILURE TO FOLLOW THESE INSTRUCTIONS MAY RESULT IN THE CANCELLATION OF YOUR SURGERY PATIENT SIGNATURE_________________________________  NURSE SIGNATURE__________________________________  ________________________________________________________________________

## 2021-10-16 ENCOUNTER — Other Ambulatory Visit: Payer: Self-pay

## 2021-10-16 ENCOUNTER — Encounter (HOSPITAL_COMMUNITY)
Admission: RE | Admit: 2021-10-16 | Discharge: 2021-10-16 | Disposition: A | Discharge status: Home / Self Care | Payer: Medicare Other | Source: Ambulatory Visit | Attending: Specialist | Admitting: Specialist

## 2021-10-16 ENCOUNTER — Encounter (HOSPITAL_COMMUNITY): Payer: Self-pay

## 2021-10-16 VITALS — BP 99/68 | HR 74 | Temp 98.6°F | Resp 18 | Ht 69.0 in | Wt 218.0 lb

## 2021-10-16 DIAGNOSIS — E139 Other specified diabetes mellitus without complications: Secondary | ICD-10-CM | POA: Diagnosis not present

## 2021-10-16 DIAGNOSIS — Z95810 Presence of automatic (implantable) cardiac defibrillator: Secondary | ICD-10-CM | POA: Insufficient documentation

## 2021-10-16 DIAGNOSIS — Z951 Presence of aortocoronary bypass graft: Secondary | ICD-10-CM | POA: Diagnosis not present

## 2021-10-16 DIAGNOSIS — I2510 Atherosclerotic heart disease of native coronary artery without angina pectoris: Secondary | ICD-10-CM | POA: Insufficient documentation

## 2021-10-16 DIAGNOSIS — M1711 Unilateral primary osteoarthritis, right knee: Secondary | ICD-10-CM | POA: Diagnosis not present

## 2021-10-16 DIAGNOSIS — Z01818 Encounter for other preprocedural examination: Secondary | ICD-10-CM | POA: Insufficient documentation

## 2021-10-16 DIAGNOSIS — K219 Gastro-esophageal reflux disease without esophagitis: Secondary | ICD-10-CM | POA: Insufficient documentation

## 2021-10-16 DIAGNOSIS — I509 Heart failure, unspecified: Secondary | ICD-10-CM | POA: Insufficient documentation

## 2021-10-16 DIAGNOSIS — I4891 Unspecified atrial fibrillation: Secondary | ICD-10-CM | POA: Insufficient documentation

## 2021-10-16 DIAGNOSIS — I110 Hypertensive heart disease with heart failure: Secondary | ICD-10-CM | POA: Insufficient documentation

## 2021-10-16 HISTORY — DX: Heart failure, unspecified: I50.9

## 2021-10-16 HISTORY — DX: Unspecified osteoarthritis, unspecified site: M19.90

## 2021-10-16 HISTORY — DX: Other complications of anesthesia, initial encounter: T88.59XA

## 2021-10-16 HISTORY — DX: Gastro-esophageal reflux disease without esophagitis: K21.9

## 2021-10-16 LAB — COMPREHENSIVE METABOLIC PANEL
ALT: 41 U/L (ref 0–44)
AST: 42 U/L — ABNORMAL HIGH (ref 15–41)
Albumin: 3.7 g/dL (ref 3.5–5.0)
Alkaline Phosphatase: 104 U/L (ref 38–126)
Anion gap: 7 (ref 5–15)
BUN: 16 mg/dL (ref 8–23)
CO2: 32 mmol/L (ref 22–32)
Calcium: 9.2 mg/dL (ref 8.9–10.3)
Chloride: 100 mmol/L (ref 98–111)
Creatinine, Ser: 0.98 mg/dL (ref 0.61–1.24)
GFR, Estimated: >60 mL/min (ref >60)
Glucose, Bld: 150 mg/dL — ABNORMAL HIGH (ref 70–99)
Potassium: 3.9 mmol/L (ref 3.5–5.1)
Sodium: 139 mmol/L (ref 135–145)
Total Bilirubin: 1 mg/dL (ref 0.3–1.2)
Total Protein: 7.2 g/dL (ref 6.5–8.1)

## 2021-10-16 LAB — CBC
HCT: 44.9 % (ref 39.0–52.0)
Hemoglobin: 15 g/dL (ref 13.0–17.0)
MCH: 31.8 pg (ref 26.0–34.0)
MCHC: 33.4 g/dL (ref 30.0–36.0)
MCV: 95.1 fL (ref 80.0–100.0)
Platelets: 182 10*3/uL (ref 150–400)
RBC: 4.72 MIL/uL (ref 4.22–5.81)
RDW: 14.6 % (ref 11.5–15.5)
WBC: 6.6 10*3/uL (ref 4.0–10.5)
nRBC: 0 % (ref 0.0–0.2)

## 2021-10-16 LAB — HEMOGLOBIN A1C
Hgb A1c MFr Bld: 7.2 % — ABNORMAL HIGH (ref 4.8–5.6)
Mean Plasma Glucose: 159.94 mg/dL

## 2021-10-16 LAB — GLUCOSE, CAPILLARY: Glucose-Capillary: 158 mg/dL — ABNORMAL HIGH (ref 70–99)

## 2021-10-16 NOTE — Progress Notes (Addendum)
COVID test- 10/26/21   PCP - Dr. Rogers Blocker Cardiologist - Dr. Delrae Rend 09/01/21  Chest x-ray - 09/21/21-CE EKG - 10/16/21-chart Stress Test - no ECHO - no Cardiac Cath - 2014 CABG x3 2014 Pacemaker/ICD device last checked:08/15/21  Sleep Study - no CPAP - no  Fasting Blood Sugar - Pt doesn't check Checks Blood Sugar _____ times a day  Blood Thinner Instructions:NA Aspirin Instructions: Last Dose:  Anesthesia review: yes  Patient denies shortness of breath, fever, cough and chest pain at PAT appointment Pt had SOB and swelling in his legs in mid Oct. His Dr.s know and it is resolving. Today he has no SOB with walking. Pt reports that he isn't able to lay on his back or Lt side because he will stop breathing.  Patient verbalized understanding of instructions that were given to them at the PAT appointment. Patient was also instructed that they will need to review over the PAT instructions again at home before surgery. Yes Pt has macular degeneration and will have his significant other read it to him again at home.

## 2021-10-17 LAB — SURGICAL PCR SCREEN
MRSA, PCR: NEGATIVE
Staphylococcus aureus: POSITIVE — AB

## 2021-10-19 DIAGNOSIS — Z6832 Body mass index (BMI) 32.0-32.9, adult: Secondary | ICD-10-CM | POA: Diagnosis not present

## 2021-10-19 DIAGNOSIS — E669 Obesity, unspecified: Secondary | ICD-10-CM | POA: Diagnosis not present

## 2021-10-19 DIAGNOSIS — I5020 Unspecified systolic (congestive) heart failure: Secondary | ICD-10-CM | POA: Diagnosis not present

## 2021-10-19 DIAGNOSIS — Z95810 Presence of automatic (implantable) cardiac defibrillator: Secondary | ICD-10-CM | POA: Diagnosis not present

## 2021-10-19 DIAGNOSIS — I493 Ventricular premature depolarization: Secondary | ICD-10-CM | POA: Diagnosis not present

## 2021-10-19 DIAGNOSIS — R0609 Other forms of dyspnea: Secondary | ICD-10-CM | POA: Diagnosis not present

## 2021-10-19 DIAGNOSIS — I440 Atrioventricular block, first degree: Secondary | ICD-10-CM | POA: Diagnosis not present

## 2021-10-19 DIAGNOSIS — I252 Old myocardial infarction: Secondary | ICD-10-CM | POA: Diagnosis not present

## 2021-10-19 DIAGNOSIS — I4891 Unspecified atrial fibrillation: Secondary | ICD-10-CM | POA: Diagnosis not present

## 2021-10-19 DIAGNOSIS — Z9889 Other specified postprocedural states: Secondary | ICD-10-CM | POA: Diagnosis not present

## 2021-10-19 DIAGNOSIS — I255 Ischemic cardiomyopathy: Secondary | ICD-10-CM | POA: Diagnosis not present

## 2021-10-21 DIAGNOSIS — I4891 Unspecified atrial fibrillation: Secondary | ICD-10-CM | POA: Diagnosis not present

## 2021-10-21 DIAGNOSIS — I440 Atrioventricular block, first degree: Secondary | ICD-10-CM | POA: Diagnosis not present

## 2021-10-21 NOTE — H&P (Signed)
TOTAL KNEE ADMISSION H&P  Patient is being admitted for right total knee arthroplasty.  Subjective:  Chief Complaint:right knee pain.  HPI: Bryan Pratt, 80 y.o. male, has a history of pain and functional disability in the right knee due to arthritis and has failed non-surgical conservative treatments for greater than 12 weeks to includeNSAID's and/or analgesics, corticosteriod injections, viscosupplementation injections, and activity modification.  Onset of symptoms was gradual, starting 5 years ago with gradually worsening course since that time. The patient noted prior procedures on the knee to include  arthroscopy on the right knee(s).  Patient currently rates pain in the right knee(s) at 5 out of 10 with activity. Patient has night pain, worsening of pain with activity and weight bearing, pain that interferes with activities of daily living, crepitus, and joint swelling.  Patient has evidence of subchondral sclerosis, periarticular osteophytes, and joint space narrowing by imaging studies. This patient has had  No  previous knee altering surgery . There is no active infection.  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   CHF (congestive heart failure) (HCC)    Complication of anesthesia    blood pressure drop   Coronary atherosclerosis of native coronary artery    Diabetes ([place])    GERD  (gastroesophageal reflux disease)    Hyperlipidemia    Hypertension    Pt denies   Macular degeneration    Obesity    Rotator cuff tear, left     Past Surgical History:  Procedure Laterality Date   CATARACT EXTRACTION, BILATERAL  2017   CORONARY ARTERY BYPASS GRAFT  2014   CYST EXCISION     Cheek   KNEE SURGERY     MAZE  2014   For a fib   TONSILLECTOMY      No current facility-administered medications for this encounter.   Current Outpatient Medications  Medication Sig Dispense Refill Last Dose   albuterol (VENTOLIN HFA) 108 (90 Base) MCG/ACT inhaler Inhale 1-2 puffs into the lungs every 4 (four) hours as needed for wheezing or shortness of breath.      amiodarone (PACERONE) 200 MG tablet Take 100 mg by mouth daily.      Ascorbic Acid (VITAMIN C) 100 MG tablet Take 100 mg by mouth daily.      atorvastatin (LIPITOR) 40 MG tablet Take 40 mg by mouth daily.      beta carotene w/minerals (OCUVITE) tablet Take 1 tablet by mouth 2 (two) times daily.      cholecalciferol (VITAMIN D) 1000 units tablet Take 1,000 Units by mouth daily.      Cyanocobalamin (VITAMIN B 12) 500 MCG TABS Take 500 mcg by mouth daily.      Docusate Calcium (STOOL SOFTENER PO) Take 100 mg by mouth daily as needed (constipation).      furosemide (LASIX) 40 MG tablet Take 1 tablet (40 mg total) by mouth daily. (Patient taking  differently: Take 80 mg by mouth 2 (two) times daily.) 90 tablet 2    glimepiride (AMARYL) 2 MG tablet Take 2 mg by mouth daily with breakfast.      Multiple Vitamins-Minerals (MULTIVITAMIN PO) Take 1 tablet by mouth daily.      nitroGLYCERIN (NITROSTAT) 0.4 MG SL tablet Place 1 tablet (0.4 mg total) under the tongue every 5 (five) minutes as needed for chest pain. 25 tablet 11    potassium chloride (KLOR-CON) 10 MEQ tablet Take 1 tablet by mouth daily as needed (when he feels like he needs it).      quiniDINE gluconate 324 MG CR tablet Take 324 mg by mouth daily.      spironolactone  (ALDACTONE) 25 MG tablet Take 25 mg by mouth daily.      tobramycin (TOBREX) 0.3 % ophthalmic solution Place 1 drop into the right eye 2 (two) times daily. Instructed to take eye drop 2 days before and 1 day after eye treatment.      traMADol (ULTRAM) 50 MG tablet Take 50 mg by mouth every 6 (six) hours as needed for moderate pain.       VITAMIN E PO Take 1,000 Units by mouth daily.      Allergies  Allergen Reactions   Morphine Other (See Comments)    "becomes unresponsive" denies need for ETT   Codeine Nausea And Vomiting   Tape Other (See Comments)    Tape after last surgery caused skin breakdown    Social History   Tobacco Use   Smoking status: Never   Smokeless tobacco: Never  Substance Use Topics   Alcohol use: No    Family History  Problem Relation Age of Onset   CAD Father    Heart attack Father    Hypertension Father    Stroke Father    Cancer Mother    Diabetes type II Sister    Heart disease Brother      Review of Systems  Constitutional: Negative.   HENT: Negative.    Eyes: Negative.   Respiratory:  Positive for shortness of breath.   Cardiovascular: Negative.   Gastrointestinal: Negative.   Endocrine: Negative.   Genitourinary: Negative.   Skin: Negative.   Allergic/Immunologic: Negative.   Neurological: Negative.   Hematological: Negative.   Psychiatric/Behavioral: Negative.     Objective:  Physical Exam Constitutional:      Appearance: Normal appearance.  HENT:     Head: Normocephalic and atraumatic.  Eyes:     Extraocular Movements: Extraocular movements intact.  Neck:     Vascular: No carotid bruit.  Cardiovascular:     Rate and Rhythm: Normal rate and regular rhythm.     Pulses: Normal pulses.     Heart sounds: Normal heart sounds. No murmur heard.   No friction rub. No gallop.  Pulmonary:     Effort: Pulmonary effort is normal. No respiratory distress.     Breath sounds: Normal breath sounds. No wheezing or rales.  Chest:     Chest  wall: No tenderness.  Musculoskeletal:        General: Tenderness (medial and lateral joint line) present. No swelling.     Cervical back: Normal range of motion and neck supple. No rigidity or tenderness.  Lymphadenopathy:     Cervical: No cervical adenopathy.  Skin:    General: Skin is warm and dry.     Capillary Refill: Capillary refill takes less than 2 seconds.  Neurological:     General:  No focal deficit present.     Mental Status: He is alert and oriented to person, place, and time.  Psychiatric:        Mood and Affect: Mood normal.        Behavior: Behavior normal.        Thought Content: Thought content normal.        Judgment: Judgment normal.    Vital signs in last 24 hours:    Labs:   Estimated body mass index is 32.19 kg/m as calculated from the following:   Height as of 10/16/21: 5\' 9"  (1.753 m).   Weight as of 10/16/21: 98.9 kg.   Imaging Review Plain radiographs demonstrate severe degenerative joint disease of the right knee(s). The overall alignment ismild varus. The bone quality appears to be fair for age and reported activity level.      Assessment/Plan:  End stage arthritis, right knee   The patient history, physical examination, clinical judgment of the provider and imaging studies are consistent with end stage degenerative joint disease of the right knee(s) and total knee arthroplasty is deemed medically necessary. The treatment options including medical management, injection therapy arthroscopy and arthroplasty were discussed at length. The risks and benefits of total knee arthroplasty were presented and reviewed. The risks due to aseptic loosening, infection, stiffness, patella tracking problems, thromboembolic complications and other imponderables were discussed. The patient acknowledged the explanation, agreed to proceed with the plan and consent was signed. Patient is being admitted for inpatient treatment for surgery, pain control, PT, OT,  prophylactic antibiotics, VTE prophylaxis, progressive ambulation and ADL's and discharge planning. The patient is planning to be discharged  home with family and out patient rehab.      Patient's anticipated LOS is less than 2 midnights, meeting these requirements: - Younger than 55 - Lives within 1 hour of care - Has a competent adult at home to recover with post-op recover - NO history of  - Chronic pain requiring opiods  - Diabetes  - Coronary Artery Disease  - Heart failure  - Heart attack  - Stroke  - DVT/VTE  - Cardiac arrhythmia  - Respiratory Failure/COPD  - Renal failure  - Anemia  - Advanced Liver disease

## 2021-10-21 NOTE — Progress Notes (Addendum)
Anesthesia Chart Review   Case: [id number] Date/Time: 10/28/21 0715   Procedure: TOTAL KNEE ARTHROPLASTY (Right: Knee) - with adductor canal block   Anesthesia type: Spinal   Pre-op diagnosis: Right knee osteoarthritis   Location: [facility] ROOM 08 / [facility]   Surgeons: Sydnee Cabal, MD       DISCUSSION:80 y.o. never smoker with h/o GERD, DM II, CAD (CABG), a-fib s/p MAZE procedure, AICD in place, right knee OA scheduled for above procedure 10/28/2021 with Dr. Sydnee Cabal.   Per PCP pt is moderate risk with stable heart disease with ICD.   Pt seen by cardiology 10/19/2021. Pt stable at this visit.  He has chronic dyspnea on exertion.  Cardiology aware of upcoming knee surgery.   Device orders have been requested.   Anticipate pt can proceed with planned procedure barring acute status change.   VS: BP 99/68   Pulse 74   Temp 37 C (Oral)   Resp 18   Ht 5\' 9"  (1.753 m)   Wt 98.9 kg   SpO2 94%   BMI 32.19 kg/m   PROVIDERS: [name], MD is PCP   Lazarus Gowda, MD is Cardiologist  LABS: Labs reviewed: Acceptable for surgery. (all labs ordered are listed, but only abnormal results are displayed)  Labs Reviewed  SURGICAL PCR SCREEN - Abnormal; Notable for the following components:      Result Value   Staphylococcus aureus POSITIVE (*)    All other components within normal limits  GLUCOSE, CAPILLARY - Abnormal; Notable for the following components:   Glucose-Capillary 158 (*)    All other components within normal limits  HEMOGLOBIN A1C - Abnormal; Notable for the following components:   Hgb A1c MFr Bld 7.2 (*)    All other components within normal limits  COMPREHENSIVE METABOLIC PANEL - Abnormal; Notable for the following components:   Glucose, Bld 150 (*)    AST 42 (*)    All other components within normal limits  CBC  TYPE AND SCREEN     IMAGES:   EKG: 10/16/21 Rate 72 bpm  Sinus rhythm with 1st degree A-V block Left axis deviation Inferior infarct ,  age undetermined Anterolateral infarct , age undetermined Abnormal ECG Since last tracing no longer with PVC  CV: Echo 12/23/2020 SUMMARY  Technically very difficult study  The left ventricular size is normal.  Mild left ventricular hypertrophy  Left ventricular systolic function is mild to moderately reduced.  There is hypokinesis and thinning of the mid to distal anterior wall  and apical akinesis. No obvious evidnece of LV apical thrombus.  Left ventricular filling pattern is prolonged relaxation.  The right ventricle is normal size.  The right ventricular systolic function is normal.  No significant valvular stenosis or regurgitation.  The aortic sinus is normal size (3.9 cm).  Difficulty to compare with the prior study due to different image  quality.  Past Medical History:  Diagnosis Date   AICD (automatic cardioverter/defibrillator) present 2020   Arthritis    knees, shoulders,   Atrial fibrillation (HCC)    CAD (coronary artery disease)    s/p cath, occluded LAD with collateral fillling, 2008, last cath 03/03/2009 with CTO of the LAD multivessell.   CHF (congestive heart failure) (HCC)    Complication of anesthesia    blood pressure drop   Coronary atherosclerosis of native coronary artery    Diabetes ([facility])    GERD (gastroesophageal reflux disease)    Hyperlipidemia    Hypertension  Pt denies   Macular degeneration    Obesity    Rotator cuff tear, left     Past Surgical History:  Procedure Laterality Date   CATARACT EXTRACTION, BILATERAL  2017   CORONARY ARTERY BYPASS GRAFT  2014   CYST EXCISION     Cheek   KNEE SURGERY     MAZE  2014   For a fib   TONSILLECTOMY      MEDICATIONS:  albuterol (VENTOLIN HFA) 108 (90 Base) MCG/ACT inhaler   amiodarone (PACERONE) 200 MG tablet   Ascorbic Acid (VITAMIN C) 100 MG tablet   atorvastatin (LIPITOR) 40 MG tablet   beta carotene w/minerals (OCUVITE) tablet   cholecalciferol (VITAMIN D) 1000 units tablet    Cyanocobalamin (VITAMIN B 12) 500 MCG TABS   Docusate Calcium (STOOL SOFTENER PO)   furosemide (LASIX) 40 MG tablet   glimepiride (AMARYL) 2 MG tablet   Multiple Vitamins-Minerals (MULTIVITAMIN PO)   nitroGLYCERIN (NITROSTAT) 0.4 MG SL tablet   potassium chloride (KLOR-CON) 10 MEQ tablet   quiniDINE gluconate 324 MG CR tablet   spironolactone (ALDACTONE) 25 MG tablet   tobramycin (TOBREX) 0.3 % ophthalmic solution   traMADol (ULTRAM) 50 MG tablet   VITAMIN E PO   No current facility-administered medications for this encounter.    Konrad Felix Ward, PA-C [facility]-Surgical [facility] [phone]

## 2021-10-22 DIAGNOSIS — M9905 Segmental and somatic dysfunction of pelvic region: Secondary | ICD-10-CM | POA: Diagnosis not present

## 2021-10-22 DIAGNOSIS — M531 Cervicobrachial syndrome: Secondary | ICD-10-CM | POA: Diagnosis not present

## 2021-10-22 DIAGNOSIS — M9901 Segmental and somatic dysfunction of cervical region: Secondary | ICD-10-CM | POA: Diagnosis not present

## 2021-10-22 DIAGNOSIS — M9903 Segmental and somatic dysfunction of lumbar region: Secondary | ICD-10-CM | POA: Diagnosis not present

## 2021-10-27 ENCOUNTER — Other Ambulatory Visit: Payer: Self-pay | Admitting: Specialist

## 2021-10-27 LAB — SARS CORONAVIRUS 2 (TAT 6-24 HRS): SARS Coronavirus 2: NEGATIVE

## 2021-10-27 NOTE — Anesthesia Preprocedure Evaluation (Addendum)
Anesthesia Evaluation  Patient identified by MRN, date of birth, ID band Patient awake    Reviewed: Allergy & Precautions, NPO status , Patient's Chart, lab work & pertinent test results  History of Anesthesia Complications Negative for: history of anesthetic complications  Airway Mallampati: II  TM Distance: >3 FB Neck ROM: Full    Dental  (+) Dental Advisory Given   Pulmonary neg pulmonary ROS,    Pulmonary exam normal        Cardiovascular hypertension, Pt. on medications + CAD and +CHF  Normal cardiovascular exam+ dysrhythmias Atrial Fibrillation + Cardiac Defibrillator    '22 TTE - EF 35-40%. Mild left ventricular hypertrophy  Hypokinesis and thinning of the mid to distal anterior wall  and apical akinesis.      Neuro/Psych negative neurological ROS  negative psych ROS   GI/Hepatic Neg liver ROS, GERD  Controlled,  Endo/Other  diabetes, Type 2, Oral Hypoglycemic Agents Obesity   Renal/GU negative Renal ROS     Musculoskeletal  (+) Arthritis ,   Abdominal   Peds  Hematology negative hematology ROS (+)   Anesthesia Other Findings   Reproductive/Obstetrics                            Anesthesia Physical Anesthesia Plan  ASA: 3  Anesthesia Plan: Spinal   Post-op Pain Management: Regional block and Tylenol PO (pre-op)   Induction:   PONV Risk Score and Plan: 1 and Treatment may vary due to age or medical condition and Propofol infusion  Airway Management Planned: Natural Airway and Simple Face Mask  Additional Equipment: None  Intra-op Plan:   Post-operative Plan:   Informed Consent: I have reviewed the patients History and Physical, chart, labs and discussed the procedure including the risks, benefits and alternatives for the proposed anesthesia with the patient or authorized representative who has indicated his/her understanding and acceptance.       Plan  Discussed with: CRNA and Anesthesiologist  Anesthesia Plan Comments: ( Lengthy discussion had about general anesthesia, patient adamant that he does not want to/ can't be "put to sleep." Labs reviewed, platelets acceptable. Discussed risks and benefits of spinal, including spinal/epidural hematoma, infection, failed block, and PDPH. Patient expressed understanding and wished to proceed. )       Anesthesia Quick Evaluation

## 2021-10-28 ENCOUNTER — Observation Stay (HOSPITAL_COMMUNITY)
Admission: RE | Admit: 2021-10-28 | Discharge: 2021-10-29 | Disposition: A | Discharge status: Home / Self Care | Payer: Medicare Other | Source: Ambulatory Visit | Attending: Specialist | Admitting: Specialist

## 2021-10-28 ENCOUNTER — Other Ambulatory Visit: Payer: Self-pay

## 2021-10-28 ENCOUNTER — Encounter (HOSPITAL_COMMUNITY): Payer: Self-pay | Admitting: Specialist

## 2021-10-28 ENCOUNTER — Ambulatory Visit (HOSPITAL_COMMUNITY): Payer: Medicare Other | Admitting: Anesthesiology

## 2021-10-28 ENCOUNTER — Encounter (HOSPITAL_COMMUNITY)
Admission: RE | Disposition: A | Discharge status: Home / Self Care | Payer: Self-pay | Source: Ambulatory Visit | Attending: Specialist

## 2021-10-28 ENCOUNTER — Ambulatory Visit (HOSPITAL_COMMUNITY): Payer: Medicare Other | Admitting: Physician Assistant

## 2021-10-28 DIAGNOSIS — I110 Hypertensive heart disease with heart failure: Secondary | ICD-10-CM | POA: Diagnosis not present

## 2021-10-28 DIAGNOSIS — Z7984 Long term (current) use of oral hypoglycemic drugs: Secondary | ICD-10-CM | POA: Diagnosis not present

## 2021-10-28 DIAGNOSIS — K219 Gastro-esophageal reflux disease without esophagitis: Secondary | ICD-10-CM | POA: Diagnosis not present

## 2021-10-28 DIAGNOSIS — Z95810 Presence of automatic (implantable) cardiac defibrillator: Secondary | ICD-10-CM | POA: Diagnosis not present

## 2021-10-28 DIAGNOSIS — Z79899 Other long term (current) drug therapy: Secondary | ICD-10-CM | POA: Insufficient documentation

## 2021-10-28 DIAGNOSIS — I2510 Atherosclerotic heart disease of native coronary artery without angina pectoris: Secondary | ICD-10-CM | POA: Insufficient documentation

## 2021-10-28 DIAGNOSIS — Z01818 Encounter for other preprocedural examination: Secondary | ICD-10-CM

## 2021-10-28 DIAGNOSIS — I5043 Acute on chronic combined systolic (congestive) and diastolic (congestive) heart failure: Secondary | ICD-10-CM | POA: Insufficient documentation

## 2021-10-28 DIAGNOSIS — E119 Type 2 diabetes mellitus without complications: Secondary | ICD-10-CM | POA: Insufficient documentation

## 2021-10-28 DIAGNOSIS — M1711 Unilateral primary osteoarthritis, right knee: Principal | ICD-10-CM | POA: Diagnosis present

## 2021-10-28 DIAGNOSIS — E785 Hyperlipidemia, unspecified: Secondary | ICD-10-CM | POA: Diagnosis not present

## 2021-10-28 DIAGNOSIS — G8918 Other acute postprocedural pain: Secondary | ICD-10-CM | POA: Diagnosis not present

## 2021-10-28 HISTORY — PX: TOTAL KNEE ARTHROPLASTY: SHX125

## 2021-10-28 LAB — TYPE AND SCREEN
ABO/RH(D): A POS
Antibody Screen: NEGATIVE

## 2021-10-28 LAB — CBC
HCT: 46.2 % (ref 39.0–52.0)
Hemoglobin: 14.7 g/dL (ref 13.0–17.0)
MCH: 31.4 pg (ref 26.0–34.0)
MCHC: 31.8 g/dL (ref 30.0–36.0)
MCV: 98.7 fL (ref 80.0–100.0)
Platelets: 195 10*3/uL (ref 150–400)
RBC: 4.68 MIL/uL (ref 4.22–5.81)
RDW: 14.4 % (ref 11.5–15.5)
WBC: 9.6 10*3/uL (ref 4.0–10.5)
nRBC: 0 % (ref 0.0–0.2)

## 2021-10-28 LAB — CREATININE, SERUM
Creatinine, Ser: 0.94 mg/dL (ref 0.61–1.24)
GFR, Estimated: >60 mL/min (ref >60)

## 2021-10-28 LAB — GLUCOSE, CAPILLARY
Glucose-Capillary: 126 mg/dL — ABNORMAL HIGH (ref 70–99)
Glucose-Capillary: 182 mg/dL — ABNORMAL HIGH (ref 70–99)

## 2021-10-28 LAB — ABO/RH: ABO/RH(D): A POS

## 2021-10-28 SURGERY — ARTHROPLASTY, KNEE, TOTAL
Anesthesia: Spinal | Site: Knee | Laterality: Right

## 2021-10-28 MED ORDER — PHENYLEPHRINE HCL-NACL 20-0.9 MG/250ML-% IV SOLN
INTRAVENOUS | Status: DC | PRN
  Administered 2021-10-28: 25 ug/min via INTRAVENOUS

## 2021-10-28 MED ORDER — ENOXAPARIN SODIUM 40 MG/0.4ML IJ SOSY
40.0000 mg | PREFILLED_SYRINGE | INTRAMUSCULAR | Status: DC
  Administered 2021-10-29: 40 mg via SUBCUTANEOUS
  Filled 2021-10-28: qty 0.4

## 2021-10-28 MED ORDER — PROPOFOL 10 MG/ML IV BOLUS
INTRAVENOUS | Status: AC
  Filled 2021-10-28: qty 20

## 2021-10-28 MED ORDER — SODIUM CHLORIDE 0.9 % IV SOLN: INTRAVENOUS | Status: DC

## 2021-10-28 MED ORDER — 0.9 % SODIUM CHLORIDE (POUR BTL) OPTIME
TOPICAL | Status: DC | PRN
  Administered 2021-10-28: 1000 mL

## 2021-10-28 MED ORDER — FENTANYL CITRATE (PF) 100 MCG/2ML IJ SOLN
INTRAMUSCULAR | Status: DC | PRN
  Administered 2021-10-28: 25 ug via INTRAVENOUS
  Administered 2021-10-28: 50 ug via INTRAVENOUS
  Administered 2021-10-28: 25 ug via INTRAVENOUS

## 2021-10-28 MED ORDER — BUPIVACAINE-EPINEPHRINE (PF) 0.5% -1:200000 IJ SOLN
INTRAMUSCULAR | Status: DC | PRN
  Administered 2021-10-28: 30 mL

## 2021-10-28 MED ORDER — MIDAZOLAM HCL 2 MG/2ML IJ SOLN
INTRAMUSCULAR | Status: AC
  Filled 2021-10-28: qty 2

## 2021-10-28 MED ORDER — ONDANSETRON HCL 4 MG/2ML IJ SOLN: 4.0000 mg | Freq: Once | INTRAMUSCULAR | Status: DC | PRN

## 2021-10-28 MED ORDER — ONDANSETRON HCL 4 MG PO TABS: 4.0000 mg | ORAL_TABLET | Freq: Four times a day (QID) | ORAL | 0 refills | Status: AC | PRN

## 2021-10-28 MED ORDER — AMIODARONE HCL 100 MG PO TABS
100.0000 mg | ORAL_TABLET | Freq: Every day | ORAL | Status: DC
  Administered 2021-10-29: 100 mg via ORAL
  Filled 2021-10-28 (×2): qty 1

## 2021-10-28 MED ORDER — METHOCARBAMOL 500 MG PO TABS
ORAL_TABLET | ORAL | Status: AC
  Filled 2021-10-28: qty 1

## 2021-10-28 MED ORDER — ATORVASTATIN CALCIUM 40 MG PO TABS
40.0000 mg | ORAL_TABLET | Freq: Every day | ORAL | Status: DC
  Administered 2021-10-29: 40 mg via ORAL
  Filled 2021-10-28: qty 1

## 2021-10-28 MED ORDER — BUPIVACAINE LIPOSOME 1.3 % IJ SUSP: 20.0000 mL | Freq: Once | INTRAMUSCULAR | Status: DC

## 2021-10-28 MED ORDER — DEXAMETHASONE SODIUM PHOSPHATE 10 MG/ML IJ SOLN
8.0000 mg | Freq: Once | INTRAMUSCULAR | Status: AC
  Administered 2021-10-28: 8 mg via INTRAVENOUS

## 2021-10-28 MED ORDER — PROPOFOL 500 MG/50ML IV EMUL
INTRAVENOUS | Status: DC | PRN
  Administered 2021-10-28: 75 ug/kg/min via INTRAVENOUS
  Administered 2021-10-28: 50 ug/kg/min via INTRAVENOUS

## 2021-10-28 MED ORDER — CEFAZOLIN SODIUM-DEXTROSE 2-4 GM/100ML-% IV SOLN
2.0000 g | INTRAVENOUS | Status: AC
  Administered 2021-10-28: 2 g via INTRAVENOUS
  Filled 2021-10-28: qty 100

## 2021-10-28 MED ORDER — POTASSIUM CHLORIDE CRYS ER 10 MEQ PO TBCR: 10.0000 meq | EXTENDED_RELEASE_TABLET | Freq: Every day | ORAL | Status: DC | PRN

## 2021-10-28 MED ORDER — DIPHENHYDRAMINE HCL 12.5 MG/5ML PO ELIX: 12.5000 mg | ORAL_SOLUTION | ORAL | Status: DC | PRN

## 2021-10-28 MED ORDER — DEXAMETHASONE SODIUM PHOSPHATE 10 MG/ML IJ SOLN
INTRAMUSCULAR | Status: AC
  Filled 2021-10-28: qty 1

## 2021-10-28 MED ORDER — QUINIDINE GLUCONATE ER 324 MG PO TBCR
324.0000 mg | EXTENDED_RELEASE_TABLET | Freq: Every day | ORAL | Status: DC
  Filled 2021-10-28 (×3): qty 1

## 2021-10-28 MED ORDER — TRAMADOL HCL 50 MG PO TABS
50.0000 mg | ORAL_TABLET | Freq: Four times a day (QID) | ORAL | Status: DC
  Administered 2021-10-28: 50 mg via ORAL
  Administered 2021-10-29: 100 mg via ORAL
  Administered 2021-10-29 (×2): 50 mg via ORAL
  Filled 2021-10-28 (×4): qty 1

## 2021-10-28 MED ORDER — PHENYLEPHRINE HCL-NACL 20-0.9 MG/250ML-% IV SOLN
INTRAVENOUS | Status: AC
  Filled 2021-10-28: qty 250

## 2021-10-28 MED ORDER — SPIRONOLACTONE 25 MG PO TABS
25.0000 mg | ORAL_TABLET | Freq: Every day | ORAL | Status: DC
  Administered 2021-10-29: 25 mg via ORAL
  Filled 2021-10-28 (×3): qty 1

## 2021-10-28 MED ORDER — BUPIVACAINE LIPOSOME 1.3 % IJ SUSP
INTRAMUSCULAR | Status: AC
  Filled 2021-10-28: qty 20

## 2021-10-28 MED ORDER — IRRISEPT - 450ML BOTTLE WITH 0.05% CHG IN STERILE WATER, USP 99.95% OPTIME
TOPICAL | Status: DC | PRN
  Administered 2021-10-28: 450 mL via TOPICAL

## 2021-10-28 MED ORDER — GLIMEPIRIDE 2 MG PO TABS
2.0000 mg | ORAL_TABLET | Freq: Every day | ORAL | Status: DC
  Administered 2021-10-29: 2 mg via ORAL
  Filled 2021-10-28 (×2): qty 1

## 2021-10-28 MED ORDER — FENTANYL CITRATE PF 50 MCG/ML IJ SOSY
25.0000 ug | PREFILLED_SYRINGE | INTRAMUSCULAR | Status: DC | PRN
  Administered 2021-10-28 (×2): 50 ug via INTRAVENOUS

## 2021-10-28 MED ORDER — ONDANSETRON HCL 4 MG/2ML IJ SOLN
INTRAMUSCULAR | Status: AC
  Filled 2021-10-28: qty 2

## 2021-10-28 MED ORDER — LACTATED RINGERS IV SOLN: INTRAVENOUS | Status: DC

## 2021-10-28 MED ORDER — METHOCARBAMOL 500 MG IVPB - SIMPLE MED
500.0000 mg | Freq: Four times a day (QID) | INTRAVENOUS | Status: DC | PRN
  Filled 2021-10-28: qty 50

## 2021-10-28 MED ORDER — FUROSEMIDE 40 MG PO TABS
40.0000 mg | ORAL_TABLET | Freq: Every day | ORAL | Status: DC
  Administered 2021-10-29: 40 mg via ORAL
  Filled 2021-10-28: qty 1

## 2021-10-28 MED ORDER — ALPRAZOLAM 0.25 MG PO TABS
0.2500 mg | ORAL_TABLET | Freq: Two times a day (BID) | ORAL | Status: DC | PRN
  Filled 2021-10-28: qty 1

## 2021-10-28 MED ORDER — FENTANYL CITRATE (PF) 100 MCG/2ML IJ SOLN
INTRAMUSCULAR | Status: AC
  Filled 2021-10-28: qty 2

## 2021-10-28 MED ORDER — ACETAMINOPHEN 325 MG PO TABS: 325.0000 mg | ORAL_TABLET | Freq: Four times a day (QID) | ORAL | Status: DC | PRN

## 2021-10-28 MED ORDER — GABAPENTIN 300 MG PO CAPS
300.0000 mg | ORAL_CAPSULE | Freq: Every day | ORAL | Status: DC
  Administered 2021-10-28: 300 mg via ORAL
  Filled 2021-10-28: qty 1

## 2021-10-28 MED ORDER — PROPOFOL 10 MG/ML IV BOLUS
INTRAVENOUS | Status: DC | PRN
  Administered 2021-10-28: 30 mg via INTRAVENOUS
  Administered 2021-10-28: 10 mg via INTRAVENOUS

## 2021-10-28 MED ORDER — METHOCARBAMOL 500 MG PO TABS
500.0000 mg | ORAL_TABLET | Freq: Four times a day (QID) | ORAL | Status: DC | PRN
  Administered 2021-10-28 (×2): 500 mg via ORAL
  Filled 2021-10-28: qty 1

## 2021-10-28 MED ORDER — PROPOFOL 500 MG/50ML IV EMUL
INTRAVENOUS | Status: AC
  Filled 2021-10-28: qty 50

## 2021-10-28 MED ORDER — SODIUM CHLORIDE (PF) 0.9 % IJ SOLN
INTRAMUSCULAR | Status: AC
  Filled 2021-10-28: qty 10

## 2021-10-28 MED ORDER — METHOCARBAMOL 500 MG PO TABS: 500.0000 mg | ORAL_TABLET | Freq: Four times a day (QID) | ORAL | 0 refills | Status: AC | PRN

## 2021-10-28 MED ORDER — GABAPENTIN 300 MG PO CAPS: 300.0000 mg | ORAL_CAPSULE | Freq: Every day | ORAL | 0 refills | Status: AC

## 2021-10-28 MED ORDER — BUPIVACAINE HCL (PF) 0.5 % IJ SOLN
INTRAMUSCULAR | Status: AC
  Filled 2021-10-28: qty 60

## 2021-10-28 MED ORDER — ONDANSETRON HCL 4 MG/2ML IJ SOLN: 4.0000 mg | Freq: Four times a day (QID) | INTRAMUSCULAR | Status: DC | PRN

## 2021-10-28 MED ORDER — HYDROCODONE-ACETAMINOPHEN 7.5-325 MG PO TABS
1.0000 | ORAL_TABLET | ORAL | Status: DC | PRN
  Administered 2021-10-28: 2 via ORAL
  Administered 2021-10-28 (×2): 1 via ORAL
  Filled 2021-10-28: qty 2

## 2021-10-28 MED ORDER — CHLORHEXIDINE GLUCONATE 0.12 % MT SOLN
15.0000 mL | Freq: Once | OROMUCOSAL | Status: AC
  Administered 2021-10-28: 15 mL via OROMUCOSAL

## 2021-10-28 MED ORDER — POVIDONE-IODINE 10 % EX SWAB
2.0000 "application " | Freq: Once | CUTANEOUS | Status: AC
  Administered 2021-10-28: 2 via TOPICAL

## 2021-10-28 MED ORDER — HYDROCODONE-ACETAMINOPHEN 7.5-325 MG PO TABS
ORAL_TABLET | ORAL | Status: AC
  Filled 2021-10-28: qty 1

## 2021-10-28 MED ORDER — HYDROCODONE-ACETAMINOPHEN 5-325 MG PO TABS: 1.0000 | ORAL_TABLET | ORAL | Status: DC | PRN

## 2021-10-28 MED ORDER — ONDANSETRON HCL 4 MG PO TABS: 4.0000 mg | ORAL_TABLET | Freq: Four times a day (QID) | ORAL | Status: DC | PRN

## 2021-10-28 MED ORDER — SODIUM CHLORIDE 0.9 % IV SOLN
INTRAVENOUS | Status: DC | PRN
  Administered 2021-10-28: 80 mL

## 2021-10-28 MED ORDER — SENNOSIDES-DOCUSATE SODIUM 8.6-50 MG PO TABS: 1.0000 | ORAL_TABLET | Freq: Every evening | ORAL | Status: DC | PRN

## 2021-10-28 MED ORDER — BUPIVACAINE HCL (PF) 0.5 % IJ SOLN
INTRAMUSCULAR | Status: DC | PRN
  Administered 2021-10-28: 2.6 mL via INTRATHECAL

## 2021-10-28 MED ORDER — CEFAZOLIN SODIUM-DEXTROSE 1-4 GM/50ML-% IV SOLN
1.0000 g | Freq: Four times a day (QID) | INTRAVENOUS | Status: AC
  Administered 2021-10-28 – 2021-10-29 (×3): 1 g via INTRAVENOUS
  Filled 2021-10-28 (×3): qty 50

## 2021-10-28 MED ORDER — BISACODYL 5 MG PO TBEC: 5.0000 mg | DELAYED_RELEASE_TABLET | Freq: Every day | ORAL | Status: DC | PRN

## 2021-10-28 MED ORDER — MIDAZOLAM HCL 2 MG/2ML IJ SOLN
INTRAMUSCULAR | Status: DC | PRN
  Administered 2021-10-28: 1 mg via INTRAVENOUS
  Administered 2021-10-28 (×2): .5 mg via INTRAVENOUS

## 2021-10-28 MED ORDER — TRANEXAMIC ACID-NACL 1000-0.7 MG/100ML-% IV SOLN
1000.0000 mg | INTRAVENOUS | Status: AC
  Administered 2021-10-28: 1000 mg via INTRAVENOUS
  Filled 2021-10-28: qty 100

## 2021-10-28 MED ORDER — GELATIN ABSORBABLE 12-7 MM EX MISC
CUTANEOUS | Status: DC | PRN
  Administered 2021-10-28: 1 via TOPICAL

## 2021-10-28 MED ORDER — ACETAMINOPHEN 500 MG PO TABS
1000.0000 mg | ORAL_TABLET | Freq: Once | ORAL | Status: AC
  Administered 2021-10-28: 1000 mg via ORAL
  Filled 2021-10-28: qty 2

## 2021-10-28 MED ORDER — ORAL CARE MOUTH RINSE: 15.0000 mL | Freq: Once | OROMUCOSAL | Status: AC

## 2021-10-28 MED ORDER — NITROGLYCERIN 0.4 MG SL SUBL: 0.4000 mg | SUBLINGUAL_TABLET | SUBLINGUAL | Status: DC | PRN

## 2021-10-28 MED ORDER — ACETAMINOPHEN 500 MG PO TABS
500.0000 mg | ORAL_TABLET | Freq: Four times a day (QID) | ORAL | Status: AC
  Administered 2021-10-28 – 2021-10-29 (×3): 500 mg via ORAL
  Filled 2021-10-28 (×3): qty 1

## 2021-10-28 MED ORDER — ONDANSETRON HCL 4 MG/2ML IJ SOLN
INTRAMUSCULAR | Status: DC | PRN
  Administered 2021-10-28: 4 mg via INTRAVENOUS

## 2021-10-28 MED ORDER — OXYCODONE HCL 5 MG PO TABS: 5.0000 mg | ORAL_TABLET | ORAL | 0 refills | Status: AC | PRN

## 2021-10-28 MED ORDER — ALBUTEROL SULFATE (2.5 MG/3ML) 0.083% IN NEBU: 3.0000 mL | INHALATION_SOLUTION | RESPIRATORY_TRACT | Status: DC | PRN

## 2021-10-28 MED ORDER — FENTANYL CITRATE PF 50 MCG/ML IJ SOSY
PREFILLED_SYRINGE | INTRAMUSCULAR | Status: AC
  Administered 2021-10-28: 50 ug via INTRAVENOUS
  Filled 2021-10-28: qty 3

## 2021-10-28 MED ORDER — SODIUM CHLORIDE 0.9 % IR SOLN
Status: DC | PRN
  Administered 2021-10-28: 1000 mL

## 2021-10-28 SURGICAL SUPPLY — 64 items
ADH SKN CLS APL DERMABOND .7 (GAUZE/BANDAGES/DRESSINGS) ×1
ATTUNE MED DOME PAT 41 KNEE (Knees) ×1 IMPLANT
ATTUNE PS FEM RT SZ 7 CEM KNEE (Femur) ×2 IMPLANT
ATTUNE PSRP INSR SZ7 12 KNEE (Insert) ×1 IMPLANT
BAG COUNTER SPONGE SURGICOUNT (BAG) ×2 IMPLANT
BAG SPEC THK2 15X12 ZIP CLS (MISCELLANEOUS) ×1
BAG SPNG CNTER NS LX DISP (BAG) ×1
BAG ZIPLOCK 12X15 (MISCELLANEOUS) ×2 IMPLANT
BASE TIBIAL ROT PLAT SZ 8 KNEE (Knees) IMPLANT
BLADE SAG 18X100X1.27 (BLADE) ×2 IMPLANT
BLADE SAW SGTL 11.0X1.19X90.0M (BLADE) ×2 IMPLANT
BNDG ELASTIC 4X5.8 VLCR STR LF (GAUZE/BANDAGES/DRESSINGS) ×2 IMPLANT
BNDG ELASTIC 6X5.8 VLCR STR LF (GAUZE/BANDAGES/DRESSINGS) ×2 IMPLANT
BONE CEMENT GENTAMICIN (Cement) ×4 IMPLANT
BOWL SMART MIX CTS (DISPOSABLE) ×2 IMPLANT
BSPLAT TIB 8 CMNT ROT PLAT STR (Knees) ×1 IMPLANT
CEMENT BONE GENTAMICIN 40 (Cement) ×2 IMPLANT
CNTNR URN SCR LID CUP LEK RST (MISCELLANEOUS) IMPLANT
CONT SPEC 4OZ STRL OR WHT (MISCELLANEOUS) ×2
COVER SURGICAL LIGHT HANDLE (MISCELLANEOUS) ×2 IMPLANT
CUFF TOURN SGL QUICK 34 (TOURNIQUET CUFF) ×2
CUFF TRNQT CYL 34X4.125X (TOURNIQUET CUFF) ×1 IMPLANT
DECANTER SPIKE VIAL GLASS SM (MISCELLANEOUS) ×1 IMPLANT
DERMABOND ADVANCED (GAUZE/BANDAGES/DRESSINGS) ×1
DERMABOND ADVANCED .7 DNX12 (GAUZE/BANDAGES/DRESSINGS) ×1 IMPLANT
DRAPE INCISE IOBAN 66X45 STRL (DRAPES) ×6 IMPLANT
DRAPE U-SHAPE 47X51 STRL (DRAPES) ×2 IMPLANT
DRSG AQUACEL AG ADV 3.5X10 (GAUZE/BANDAGES/DRESSINGS) ×2 IMPLANT
DURAPREP 26ML APPLICATOR (WOUND CARE) ×4 IMPLANT
ELECT REM PT RETURN 15FT ADLT (MISCELLANEOUS) ×2 IMPLANT
FACESHIELD WRAPAROUND (MASK) ×2 IMPLANT
GLOVE SRG 8 PF TXTR STRL LF DI (GLOVE) ×1 IMPLANT
GLOVE SURG NEOPR MICRO LF SZ8 (GLOVE) ×2 IMPLANT
GLOVE SURG ORTHO LTX SZ9 (GLOVE) ×2 IMPLANT
GLOVE SURG POLYISO LF SZ7 (GLOVE) ×2 IMPLANT
GLOVE SURG UNDER POLY LF SZ7.5 (GLOVE) ×2 IMPLANT
GLOVE SURG UNDER POLY LF SZ8 (GLOVE) ×2
GOWN STRL REUS W/TWL XL LVL3 (GOWN DISPOSABLE) ×4 IMPLANT
HANDPIECE INTERPULSE COAX TIP (DISPOSABLE) ×2
JET LAVAGE IRRISEPT WOUND (IRRIGATION / IRRIGATOR) ×2
KIT TURNOVER KIT A (KITS) IMPLANT
LAVAGE JET IRRISEPT WOUND (IRRIGATION / IRRIGATOR) IMPLANT
NS IRRIG 1000ML POUR BTL (IV SOLUTION) ×2 IMPLANT
PACK TOTAL KNEE CUSTOM (KITS) ×2 IMPLANT
PROTECTOR NERVE ULNAR (MISCELLANEOUS) ×2 IMPLANT
SET HNDPC FAN SPRY TIP SCT (DISPOSABLE) ×1 IMPLANT
SET PAD KNEE POSITIONER (MISCELLANEOUS) ×2 IMPLANT
SPONGE SURGIFOAM ABS GEL 100 (HEMOSTASIS) ×2 IMPLANT
SPONGE T-LAP 18X18 ~~LOC~~+RFID (SPONGE) ×6 IMPLANT
STOCKINETTE 6  STRL (DRAPES) ×2
STOCKINETTE 6 STRL (DRAPES) ×1 IMPLANT
SUT MNCRL AB 3-0 PS2 18 (SUTURE) ×2 IMPLANT
SUT VIC AB 1 CT1 27 (SUTURE) ×6
SUT VIC AB 1 CT1 27XBRD ANTBC (SUTURE) ×3 IMPLANT
SUT VIC AB 2-0 CT1 27 (SUTURE) ×4
SUT VIC AB 2-0 CT1 TAPERPNT 27 (SUTURE) ×2 IMPLANT
SUT VLOC 180 0 24IN GS25 (SUTURE) ×2 IMPLANT
SYR 50ML LL SCALE MARK (SYRINGE) ×2 IMPLANT
TAPE STRIPS DRAPE STRL (GAUZE/BANDAGES/DRESSINGS) ×2 IMPLANT
TIBIAL BASE ROT PLAT SZ 8 KNEE (Knees) ×2 IMPLANT
TRAY CATH INTERMITTENT SS 16FR (CATHETERS) ×2 IMPLANT
TUBE SUCTION HIGH CAP CLEAR NV (SUCTIONS) ×2 IMPLANT
WATER STERILE IRR 1000ML POUR (IV SOLUTION) ×4 IMPLANT
WRAP KNEE MAXI GEL POST OP (GAUZE/BANDAGES/DRESSINGS) ×1 IMPLANT

## 2021-10-28 NOTE — Op Note (Signed)
DATE OF SURGERY:  10/28/2021  TIME: 9:56 AM  PATIENT NAME:  Bryan Pratt    AGE: 80 y.o.   PRE-OPERATIVE DIAGNOSIS:  Right knee osteoarthritis  POST-OPERATIVE DIAGNOSIS:  Right knee osteoarthritis  PROCEDURE:  Procedure(s): TOTAL KNEE ARTHROPLASTY  SURGEON:  Kamaya Keckler ANDREW  ASSISTANT:  Leeanne Haus, PA-C, present and scrubbed throughout the case, critical for assistance with exposure, retraction, instrumentation, and closure.  OPERATIVE IMPLANTS: Depuy PFC Attune Rotating Platform.  Femur size 7, Tibia size 8, Patella size 41 3-peg oval button, with a 12 mm polyethylene insert.   PREOPERATIVE INDICATIONS:   [name] is a 80 y.o. year old male with end stage bone on bone arthritis of the knee who failed conservative treatment and elected for Total Knee Arthroplasty.   The risks, benefits, and alternatives were discussed at length including but not limited to the risks of infection, bleeding, nerve injury, stiffness, blood clots, the need for revision surgery, cardiopulmonary complications, among others, and they were willing to proceed.  OPERATIVE DESCRIPTION:  The patient was brought to the operative room and placed in a supine position.  Spinal anesthesia was administered.  IV antibiotics were given.  The lower extremity was prepped and draped in the usual sterile fashion.  Time out was performed.  The leg was elevated and exsanguinated and the tourniquet was inflated.  Anterior quadriceps tendon splitting approach was performed.  The patella was retracted and osteophytes were removed.  The anterior horn of the medial and lateral meniscus was removed and cruciate ligaments resected.   The distal femur was opened with the drill and the intramedullary distal femoral cutting jig was utilized, set at 5 degrees resecting 10 mm off the distal femur.  Care was taken to protect the collateral ligaments.  The distal femoral sizing jig was applied, taking care to avoid  notching.  Then the 4-in-1 cutting jig was applied and the anterior and posterior femur was cut, along with the chamfer cuts.    Then the extramedullary tibial cutting jig was utilized making the appropriate cut using the anterior tibial crest as a reference building in appropriate posterior slope.  Care was taken during the cut to protect the medial and collateral ligaments.  The proximal tibia was removed along with the posterior horns of the menisci.   The posterior medial femoral osteophytes and posterior lateral femoral osteophytes were removed.    The flexion gap was then measured and was symmetric with the extension gap, measured at 12.  I completed the distal femoral preparation using the appropriate jig to prepare the box.  The patella was then measured, and cut with the saw.    The proximal tibia sized and prepared accordingly with the reamer and the punch, and then all components were trialed with the trial insert.  The knee was found to have excellent balance and full motion.    The above named components were then cemented into place and all excess cement was removed.  The trial polyethylene component was in place during cementation, and then was exchanged for the real polyethylene component.    The knee was easily taken through a range of motion and the patella tracked well and the knee irrigated copiously and the parapatellar and subcutaneous tissue closed with vicryl, and monocryl with steri strips for the skin.  The arthrotomy was closed at 90 of flexion. The wounds were dressed with sterile gauze and the tourniquet released and the patient was awakened and returned to the PACU in  stable and satisfactory condition.  There were no complications.  Total tourniquet time was 88 minutes.

## 2021-10-28 NOTE — Anesthesia Procedure Notes (Signed)
Procedure Name: MAC Date/Time: 10/28/2021 7:57 AM Performed by: Lollie Sails, CRNA Pre-anesthesia Checklist: Patient identified, Emergency Drugs available, Suction available, Patient being monitored and Timeout performed Oxygen Delivery Method: Simple face mask Placement Confirmation: positive ETCO2

## 2021-10-28 NOTE — Progress Notes (Signed)
Noticed bloody urine with blood clots while pt was urinating in the bathroom. Called [place], [place] to inform about the blood clots. As per her, continue to monitor the pt. No new orders. Will continue to monitor the pt.

## 2021-10-28 NOTE — Interval H&P Note (Signed)
History and Physical Interval Note:  10/28/2021 7:50 AM  Bryan Pratt  has presented today for surgery, with the diagnosis of Right knee osteoarthritis.  The various methods of treatment have been discussed with the patient and family. After consideration of risks, benefits and other options for treatment, the patient has consented to  Procedure(s) with comments: TOTAL KNEE ARTHROPLASTY (Right) - with adductor canal block as a surgical intervention.  The patient's history has been reviewed, patient examined, no change in status, stable for surgery.  I have reviewed the patient's chart and labs.  Questions were answered to the patient's satisfaction.     [name] ANDREW

## 2021-10-28 NOTE — Transfer of Care (Signed)
Immediate Anesthesia Transfer of Care Note  Patient: Bryan Pratt  Procedure(s) Performed: TOTAL KNEE ARTHROPLASTY (Right: Knee)  Patient Location: PACU  Anesthesia Type:Spinal and MAC combined with regional for post-op pain  Level of Consciousness: sedated  Airway & Oxygen Therapy: Patient Spontanous Breathing and Patient connected to face mask oxygen  Post-op Assessment: Report given to RN and Post -op Vital signs reviewed and stable  Post vital signs: Reviewed and stable  Last Vitals:  Vitals Value Taken Time  BP 110/66 10/28/21 1020  Temp    Pulse 85 10/28/21 1022  Resp 16 10/28/21 1022  SpO2 99 % 10/28/21 1022  Vitals shown include unvalidated device data.  Last Pain:  Vitals:   10/28/21 0626  TempSrc:   PainSc: 6       Patients Stated Pain Goal: 4 (24/26/83 4196)  Complications: No notable events documented.

## 2021-10-28 NOTE — Evaluation (Signed)
Physical Therapy Evaluation Patient Details Name: Bryan Pratt MRN: [id number] DOB: 15-May-1941 Today's Date: 10/28/2021  History of Present Illness  80 y.o. male admitted 10/28/21 for R TKA. PMH includes HTN, DM, obesity, CHF.  Clinical Impression  Pt is s/p TKA resulting in the deficits listed below (see PT Problem List). Pt ambulated 14' + 6' with RW, distance limtied by fatigue. Pt urinated in the bathroom and was noted to have large blood clots in urine, RN aware. Initiated TKA HEP.  Pt will benefit from skilled PT to increase their independence and safety with mobility to allow discharge to the venue listed below.         Recommendations for follow up therapy are one component of a multi-disciplinary discharge planning process, led by the attending physician.  Recommendations may be updated based on patient status, additional functional criteria and insurance authorization.  Follow Up Recommendations Follow physician's recommendations for discharge plan and follow up therapies    Assistance Recommended at Discharge Intermittent Supervision/Assistance  Functional Status Assessment Patient has had a recent decline in their functional status and demonstrates the ability to make significant improvements in function in a reasonable and predictable amount of time.  Equipment Recommendations  None recommended by PT    Recommendations for Other Services       Precautions / Restrictions Precautions Precautions: Knee;Fall Restrictions Weight Bearing Restrictions: No      Mobility  Bed Mobility Overal bed mobility: Needs Assistance Bed Mobility: Supine to Sit     Supine to sit: Mod assist     General bed mobility comments: mod A to raise trunk and pivot hips to EOB    Transfers Overall transfer level: Needs assistance Equipment used: Rolling walker (2 wheels) Transfers: Sit to/from Stand Sit to Stand: Min assist           General transfer comment: VCs hand  placement, min A to power up    Ambulation/Gait Ambulation/Gait assistance: Min guard Gait Distance (Feet): 14 Feet (14' + 6') Assistive device: Rolling walker (2 wheels) Gait Pattern/deviations: Step-to pattern Gait velocity: decr     General Gait Details: VCs sequencing, increased time for all mobility, frequent standing rest breaks. Pt urinated in the bathroom and had large blood clots in urine, RN called to room.  Stairs            Wheelchair Mobility    Modified Rankin (Stroke Patients Only)       Balance Overall balance assessment: Needs assistance   Sitting balance-Leahy Scale: Good     Standing balance support: Bilateral upper extremity supported;Reliant on assistive device for balance Standing balance-Leahy Scale: Poor                               Pertinent Vitals/Pain Pain Assessment: 0-10 Pain Score: 5  Pain Location: R knee with movement Pain Descriptors / Indicators: Operative site guarding;Grimacing Pain Intervention(s): Limited activity within patient's tolerance;Monitored during session;Premedicated before session;Repositioned (pt declined ice)    Home Living Family/patient expects to be discharged to:: Private residence Living Arrangements: Spouse/significant other Available Help at Discharge: Family;Available 24 hours/day Type of Home: House Home Access: Level entry       Home Layout: One level Home Equipment: Conservation officer, nature (2 wheels);Cane - single point      Prior Function Prior Level of Function : Independent/Modified Independent  Hand Dominance        Extremity/Trunk Assessment   Upper Extremity Assessment Upper Extremity Assessment: Overall WFL for tasks assessed    Lower Extremity Assessment Lower Extremity Assessment: RLE deficits/detail RLE Deficits / Details: knee ext +2/5, knee AAROM 10-60* RLE Sensation: WNL RLE Coordination: WNL    Cervical / Trunk Assessment Cervical /  Trunk Assessment: Normal  Communication   Communication: No difficulties  Cognition Arousal/Alertness: Awake/alert Behavior During Therapy: WFL for tasks assessed/performed Overall Cognitive Status: Within Functional Limits for tasks assessed                                          General Comments      Exercises Total Joint Exercises Ankle Circles/Pumps: AROM;Both;15 reps;Supine Long Arc Quad: AROM;Right;5 reps;Seated Knee Flexion: AAROM;Right;10 reps;Seated Goniometric ROM: 10-60* AAROM R knee   Assessment/Plan    PT Assessment Patient needs continued PT services  PT Problem List Decreased range of motion;Decreased mobility;Decreased activity tolerance;Obesity;Pain;Decreased balance;Decreased knowledge of use of DME       PT Treatment Interventions Gait training;DME instruction;Therapeutic activities;Therapeutic exercise;Functional mobility training;Balance training;Patient/family education    PT Goals (Current goals can be found in the Care Plan section)  Acute Rehab PT Goals Patient Stated Goal: to walk PT Goal Formulation: With patient Time For Goal Achievement: 11/04/21 Potential to Achieve Goals: Good    Frequency 7X/week   Barriers to discharge        Co-evaluation               AM-PAC PT "6 Clicks" Mobility  Outcome Measure Help needed turning from your back to your side while in a flat bed without using bedrails?: A Little Help needed moving from lying on your back to sitting on the side of a flat bed without using bedrails?: A Lot Help needed moving to and from a bed to a chair (including a wheelchair)?: A Little Help needed standing up from a chair using your arms (e.g., wheelchair or bedside chair)?: A Little Help needed to walk in hospital room?: A Little Help needed climbing 3-5 steps with a railing? : A Lot 6 Click Score: 16    End of Session Equipment Utilized During Treatment: Gait belt Activity Tolerance: Patient  tolerated treatment well;Patient limited by fatigue Patient left: in chair;with chair alarm set;with family/visitor present;with call bell/phone within reach Nurse Communication: Mobility status PT Visit Diagnosis: Difficulty in walking, not elsewhere classified (R26.2);Pain;Muscle weakness (generalized) (M62.81) Pain - Right/Left: Right Pain - part of body: Knee    Time: 3903-0092 PT Time Calculation (min) (ACUTE ONLY): 36 min   Charges:   PT Evaluation $PT Eval Moderate Complexity: 1 Mod PT Treatments $Gait Training: 8-22 mins        Blondell Reveal Kistler PT 10/28/2021  Acute Rehabilitation Services Pager [phone] Office [phone]

## 2021-10-28 NOTE — Anesthesia Procedure Notes (Signed)
Anesthesia Regional Block: Adductor canal block   Pre-Anesthetic Checklist: , timeout performed,  Correct Patient, Correct Site, Correct Laterality,  Correct Procedure, Correct Position, site marked,  Risks and benefits discussed,  Surgical consent,  Pre-op evaluation,  At surgeon's request and post-op pain management  Laterality: Right  Prep: chloraprep       Needles:  Injection technique: Single-shot  Needle Type: Echogenic Needle     Needle Length: 10cm  Needle Gauge: 21     Additional Needles:   Narrative:  Start time: 10/28/2021 7:02 AM End time: 10/28/2021 7:06 AM Injection made incrementally with aspirations every 5 mL.  Performed by: Personally  Anesthesiologist: Audry Pili, MD  Additional Notes: No pain on injection. No increased resistance to injection. Injection made in 5cc increments. Good needle visualization. Patient tolerated the procedure well.

## 2021-10-28 NOTE — Anesthesia Procedure Notes (Signed)
Spinal  Patient location during procedure: OR Start time: 10/28/2021 7:58 AM End time: 10/28/2021 8:02 AM Reason for block: surgical anesthesia Staffing Performed: anesthesiologist  Anesthesiologist: Audry Pili, MD Preanesthetic Checklist Completed: patient identified, IV checked, risks and benefits discussed, surgical consent, monitors and equipment checked, pre-op evaluation and timeout performed Spinal Block Patient position: sitting Prep: DuraPrep Patient monitoring: heart rate, cardiac monitor, continuous pulse ox and blood pressure Approach: midline Location: L3-4 Injection technique: single-shot Needle Needle type: Pencan  Needle gauge: 24 G Additional Notes Consent was obtained prior to the procedure with all questions answered and concerns addressed. Risks including, but not limited to, bleeding, infection, nerve damage, paralysis, failed block, inadequate analgesia, allergic reaction, high spinal, itching, and headache were discussed and the patient wished to proceed. Functioning IV was confirmed and monitors were applied. Sterile prep and drape, including hand hygiene, mask, and sterile gloves were used. The patient was positioned and the spine was prepped. The skin was anesthetized with lidocaine. Free flow of clear CSF was obtained prior to injecting local anesthetic into the CSF. The spinal needle aspirated freely following injection. The needle was carefully withdrawn. The patient tolerated the procedure well.   Renold Don, MD

## 2021-10-28 NOTE — Anesthesia Postprocedure Evaluation (Signed)
Anesthesia Post Note  Patient: Bryan Pratt  Procedure(s) Performed: TOTAL KNEE ARTHROPLASTY (Right: Knee)     Patient location during evaluation: PACU Anesthesia Type: Spinal Level of consciousness: awake and alert Pain management: pain level controlled Vital Signs Assessment: post-procedure vital signs reviewed and stable Respiratory status: spontaneous breathing, respiratory function stable and patient connected to nasal cannula oxygen Cardiovascular status: blood pressure returned to baseline and stable Postop Assessment: spinal receding and no apparent nausea or vomiting Anesthetic complications: no   No notable events documented.  Last Vitals:  Vitals:   10/28/21 1230 10/28/21 1255  BP: 104/76   Pulse:  84  Resp:  (!) 22  Temp:    SpO2:  90%    Last Pain:  Vitals:   10/28/21 1255  TempSrc:   PainSc: 0-No pain                 Audry Pili

## 2021-10-29 DIAGNOSIS — I110 Hypertensive heart disease with heart failure: Secondary | ICD-10-CM | POA: Diagnosis not present

## 2021-10-29 DIAGNOSIS — I2510 Atherosclerotic heart disease of native coronary artery without angina pectoris: Secondary | ICD-10-CM | POA: Diagnosis not present

## 2021-10-29 DIAGNOSIS — E119 Type 2 diabetes mellitus without complications: Secondary | ICD-10-CM | POA: Diagnosis not present

## 2021-10-29 DIAGNOSIS — I5043 Acute on chronic combined systolic (congestive) and diastolic (congestive) heart failure: Secondary | ICD-10-CM | POA: Diagnosis not present

## 2021-10-29 DIAGNOSIS — Z95810 Presence of automatic (implantable) cardiac defibrillator: Secondary | ICD-10-CM | POA: Diagnosis not present

## 2021-10-29 DIAGNOSIS — M1711 Unilateral primary osteoarthritis, right knee: Secondary | ICD-10-CM | POA: Diagnosis not present

## 2021-10-29 LAB — CBC
HCT: 32.5 % — ABNORMAL LOW (ref 39.0–52.0)
Hemoglobin: 10.7 g/dL — ABNORMAL LOW (ref 13.0–17.0)
MCH: 31.8 pg (ref 26.0–34.0)
MCHC: 32.9 g/dL (ref 30.0–36.0)
MCV: 96.7 fL (ref 80.0–100.0)
Platelets: 142 10*3/uL — ABNORMAL LOW (ref 150–400)
RBC: 3.36 MIL/uL — ABNORMAL LOW (ref 4.22–5.81)
RDW: 14.3 % (ref 11.5–15.5)
WBC: 16.2 10*3/uL — ABNORMAL HIGH (ref 4.0–10.5)
nRBC: 0 % (ref 0.0–0.2)

## 2021-10-29 NOTE — Progress Notes (Signed)
   Subjective: 1 Day Post-Op Procedure(s) (LRB): TOTAL KNEE ARTHROPLASTY (Right) Patient reports pain as mild and well controlled with meds Plan is to go Home after hospital stay.  Objective: Vital signs in last 24 hours: Temp:  [97 F (36.1 C)-98.9 F (37.2 C)] 98 F (36.7 C) (11/24 0551) Pulse Rate:  [55-96] 92 (11/24 0551) Resp:  [14-27] 18 (11/24 0551) BP: (92-120)/(60-92) 104/63 (11/24 0551) SpO2:  [89 %-100 %] 92 % (11/24 0551)  Intake/Output from previous day:  Intake/Output Summary (Last 24 hours) at 10/29/2021 0847 Last data filed at 10/29/2021 0600 Gross per 24 hour  Intake 1596.92 ml  Output 200 ml  Net 1396.92 ml    Intake/Output this shift: No intake/output data recorded.  Labs: Recent Labs    10/28/21 1034 10/29/21 0330  HGB 14.7 10.7*   Recent Labs    10/28/21 1034 10/29/21 0330  WBC 9.6 16.2*  RBC 4.68 3.36*  HCT 46.2 32.5*  PLT 195 142*   Recent Labs    10/28/21 1034  CREATININE 0.94   No results for input(s): LABPT, INR in the last 72 hours.  EXAM General - Patient is Alert, Appropriate, and Oriented Extremity - Neurologically intact Dorsiflexion/Plantar flexion intact Compartment soft Dressing - dressing C/D/I Motor Function - intact, moving foot and toes well on exam.    Past Medical History:  Diagnosis Date   AICD (automatic cardioverter/defibrillator) present 2020   Arthritis    knees, shoulders,   Atrial fibrillation (HCC)    CAD (coronary artery disease)    s/p cath, occluded LAD with collateral fillling, 2008, last cath 03/03/2009 with CTO of the LAD multivessell.   CHF (congestive heart failure) (HCC)    Complication of anesthesia    blood pressure drop   Coronary atherosclerosis of native coronary artery    Diabetes (HCC)    GERD (gastroesophageal reflux disease)    Hyperlipidemia    Hypertension    Pt denies   Macular degeneration    Obesity    Rotator cuff tear, left     Assessment/Plan: 1 Day Post-Op  Procedure(s) (LRB): TOTAL KNEE ARTHROPLASTY (Right) Principal Problem:   Osteoarthritis of right knee   Advance diet Up with therapy Discharge home after therapy as long as he has met goals for discharge   Gaynelle Arabian 10/29/2021, 8:47 AM

## 2021-10-29 NOTE — Progress Notes (Signed)
Provided discharge education/instructions, all questions and concerns addressed, Pt not in acute distress, to discharge home with belongings accompanied by grand daughter.

## 2021-10-29 NOTE — Plan of Care (Signed)

## 2021-10-29 NOTE — Progress Notes (Addendum)
Physical Therapy Treatment Patient Details Name: Bryan Pratt MRN: [id number] DOB: Jan 25, 1941 Today's Date: 10/29/2021   History of Present Illness 80 y.o. male admitted 10/28/21 for R TKA. PMH includes HTN, DM, obesity, CHF.    PT Comments    Pt is progressing well with mobility, he ambulated 11' with RW. Instructed pt in TKA HEP, he demonstrates good understanding of HEP. Blisters noted medial to aquacell dressing, dressing is saturated in blood distal 1/2, RN notified. He has no stairs at home. He is ready to DC from a PT standpoint.    Recommendations for follow up therapy are one component of a multi-disciplinary discharge planning process, led by the attending physician.  Recommendations may be updated based on patient status, additional functional criteria and insurance authorization.  Follow Up Recommendations  Follow physician's recommendations for discharge plan and follow up therapies     Assistance Recommended at Discharge Intermittent Supervision/Assistance  Equipment Recommendations  None recommended by PT    Recommendations for Other Services       Precautions / Restrictions Precautions Precautions: Knee;Fall Precaution Booklet Issued: Yes (comment) Precaution Comments: reviewed no pillow under knee Restrictions Weight Bearing Restrictions: No Other Position/Activity Restrictions: WBAT     Mobility  Bed Mobility Overal bed mobility: Needs Assistance Bed Mobility: Supine to Sit     Supine to sit: Modified independent (Device/Increase time);HOB elevated     General bed mobility comments: used rail, HOB up    Transfers Overall transfer level: Needs assistance Equipment used: Rolling walker (2 wheels) Transfers: Sit to/from Stand Sit to Stand: Supervision;From elevated surface           General transfer comment: VCs hand placement    Ambulation/Gait Ambulation/Gait assistance: Supervision Gait Distance (Feet): 70 Feet Assistive device:  Rolling walker (2 wheels) Gait Pattern/deviations: Step-to pattern Gait velocity: decr     General Gait Details: steady, no loss of balance   Stairs             Wheelchair Mobility    Modified Rankin (Stroke Patients Only)       Balance Overall balance assessment: Needs assistance   Sitting balance-Leahy Scale: Good     Standing balance support: Bilateral upper extremity supported;Reliant on assistive device for balance Standing balance-Leahy Scale: Poor                              Cognition Arousal/Alertness: Awake/alert Behavior During Therapy: WFL for tasks assessed/performed Overall Cognitive Status: Within Functional Limits for tasks assessed                                          Exercises Total Joint Exercises Ankle Circles/Pumps: AROM;Both;15 reps;Supine Quad Sets: AROM;Right;5 reps;Supine Short Arc Quad: AROM;Right;10 reps;AAROM;Supine Heel Slides: AAROM;Right;10 reps;Supine Hip ABduction/ADduction: AAROM;Right;10 reps;Supine Straight Leg Raises: AAROM;Right;5 reps;Supine Long Arc Quad: AROM;Right;Seated;10 reps;AAROM Knee Flexion: AAROM;Right;10 reps;Seated Goniometric ROM: 10-65* AAROM R knee, pt reports limited extension prior to surgery 2* football injury 55 years ago    General Comments        Pertinent Vitals/Pain Pain Score: 8  Pain Location: R knee with movement Pain Descriptors / Indicators: Operative site guarding;Grimacing Pain Intervention(s): Limited activity within patient's tolerance;Monitored during session;Premedicated before session;Ice applied    Home Living  Prior Function            PT Goals (current goals can now be found in the care plan section) Acute Rehab PT Goals Patient Stated Goal: to walk, golf, ride in a boat PT Goal Formulation: With patient Time For Goal Achievement: 11/04/21 Potential to Achieve Goals: Good Progress towards PT goals:  Progressing toward goals    Frequency    7X/week      PT Plan Current plan remains appropriate    Co-evaluation              AM-PAC PT "6 Clicks" Mobility   Outcome Measure  Help needed turning from your back to your side while in a flat bed without using bedrails?: A Little Help needed moving from lying on your back to sitting on the side of a flat bed without using bedrails?: A Little Help needed moving to and from a bed to a chair (including a wheelchair)?: A Little Help needed standing up from a chair using your arms (e.g., wheelchair or bedside chair)?: A Little Help needed to walk in hospital room?: A Little Help needed climbing 3-5 steps with a railing? : A Little 6 Click Score: 18    End of Session Equipment Utilized During Treatment: Gait belt Activity Tolerance: Patient tolerated treatment well Patient left: in chair;with chair alarm set;with call bell/phone within reach Nurse Communication: Mobility status PT Visit Diagnosis: Difficulty in walking, not elsewhere classified (R26.2);Pain;Muscle weakness (generalized) (M62.81) Pain - Right/Left: Right Pain - part of body: Knee     Time: 4801-6553 PT Time Calculation (min) (ACUTE ONLY): 31 min  Charges:  $Gait Training: 8-22 mins $Therapeutic Exercise: 8-22 mins                    Blondell Reveal Kistler PT 10/29/2021  Acute Rehabilitation Services Pager [phone] Office [phone]

## 2021-10-30 ENCOUNTER — Encounter (HOSPITAL_COMMUNITY): Payer: Self-pay | Admitting: Specialist

## 2021-11-10 DIAGNOSIS — Z4502 Encounter for adjustment and management of automatic implantable cardiac defibrillator: Secondary | ICD-10-CM | POA: Diagnosis not present

## 2021-11-10 DIAGNOSIS — I472 Ventricular tachycardia, unspecified: Secondary | ICD-10-CM | POA: Diagnosis not present

## 2021-11-11 DIAGNOSIS — Z4789 Encounter for other orthopedic aftercare: Secondary | ICD-10-CM | POA: Diagnosis not present

## 2021-11-11 NOTE — Discharge Summary (Signed)
Physician Discharge Summary  Patient ID: Bryan Pratt MRN: [id number] DOB/AGE: [birth year] 80 y.o.  Admit date: 10/28/2021 Discharge date: 11/11/2021  Admission Diagnoses: Right knee osteoarthritis Discharge Diagnoses:  Principal Problem:   Osteoarthritis of right knee   Discharged Condition: good  Hospital Course: Patient was admitted on 10/28/2021 for a right TKA due to end stage right knee osteoarthritis. He was take to the operating room and tolerated surgery well. He was sent to PACU and the floor in stable condition. He was able to get up with PT PO day 0 and did well. No events overnight. He was seen PO day 1 by PT and was cleared to go home. He was doing well. Was sent home with pain meds, nausea medication and Robaxin. He will follow up in 2 weeks in the office.   Consults: None  Significant Diagnostic Studies: none  Treatments: IV hydration, antibiotics: Ancef, analgesia: acetaminophen, Vicodin, and Morphine, anticoagulation: ASA, therapies: PT, and surgery: right TKA  Discharge Exam: Blood pressure 116/64, pulse 93, temperature 98.6 F (37 C), temperature source Oral, resp. rate 18, height 5\' 9"  (1.753 m), weight 98.9 kg, SpO2 90 %. General appearance: alert, cooperative, and no distress Extremities: extremities normal, atraumatic, no cyanosis or edema and Homans sign is negative, no sign of DVT Pulses: 2+ and symmetric Skin: Skin color, texture, turgor normal. No rashes or lesions  Disposition: Discharge disposition: 01-Home or Self Care       Discharge Instructions     Call MD / Call 911   Complete by: As directed    If you experience chest pain or shortness of breath, CALL 911 and be transported to the hospital emergency room.  If you develope a fever above 101 F, pus (white drainage) or increased drainage or redness at the wound, or calf pain, call your surgeon's office.   Call MD / Call 911   Complete by: As directed    If you experience chest pain or  shortness of breath, CALL 911 and be transported to the hospital emergency room.  If you develope a fever above 101 F, pus (white drainage) or increased drainage or redness at the wound, or calf pain, call your surgeon's office.   Change dressing   Complete by: As directed    You may remove the bulky bandage (ACE wrap and gauze) two days after surgery. You will have an adhesive waterproof bandage underneath. Leave this in place until your first follow-up appointment.   Constipation Prevention   Complete by: As directed    Drink plenty of fluids.  Prune juice may be helpful.  You may use a stool softener, such as Colace (over the counter) 100 mg twice a day.  Use MiraLax (over the counter) for constipation as needed.   Constipation Prevention   Complete by: As directed    Drink plenty of fluids.  Prune juice may be helpful.  You may use a stool softener, such as Colace (over the counter) 100 mg twice a day.  Use MiraLax (over the counter) for constipation as needed.   Diet - low sodium heart healthy   Complete by: As directed    Diet - low sodium heart healthy   Complete by: As directed    Discharge instructions   Complete by: As directed    Dr. Sydnee Cabal [facility] [address], [place], [place] [address] [phone]  TOTAL KNEE REPLACEMENT POSTOPERATIVE DIRECTIONS  Knee Rehabilitation, Guidelines Following Surgery  Results after knee surgery  are often greatly improved when you follow the exercise, range of motion and muscle strengthening exercises prescribed by your doctor. Safety measures are also important to protect the knee from further injury. Any time any of these exercises cause you to have increased pain or swelling in your knee joint, decrease the amount until you are comfortable again and slowly increase them. If you have problems or questions, call your caregiver or physical therapist for advice.   HOME CARE INSTRUCTIONS  Remove items at home which could  result in a fall. This includes throw rugs or furniture in walking pathways.  ICE to the affected knee every three hours for 30 minutes at a time and then as needed for pain and swelling.  Continue to use ice on the knee for pain and swelling from surgery. You may notice swelling that will progress down to the foot and ankle.  This is normal after surgery.  Elevate the leg when you are not up walking on it.   Continue to use the breathing machine which will help keep your temperature down.  It is common for your temperature to cycle up and down following surgery, especially at night when you are not up moving around and exerting yourself.  The breathing machine keeps your lungs expanded and your temperature down. Do not place pillow under knee, focus on keeping the knee straight while resting   DIET You may resume your previous home diet once your are discharged from the hospital.  DRESSING / WOUND CARE / SHOWERING Keep the surgical dressing until follow up.  The dressing is water proof, but you need to put extra covering over it like plastic wrap.  IF THE DRESSING FALLS OFF or the wound gets wet inside, change the dressing with sterile gauze.  Please use good hand washing techniques before changing the dressing.  Do not use any lotions or creams on the incision until instructed by your surgeon.   You may start showering once you are discharged home but do not submerge the incision under water.  You are sent home with an ACE bandage on over the leg, this can be removed at 3 days from surgery. At this time you can start showering. Please place the white TED stocking on the surgical leg after. This needs to be worn on the surgical leg for 2 weeks after surgery.   ACTIVITY Walk with your walker as instructed. Use walker as long as suggested by your caregivers. Avoid periods of inactivity such as sitting longer than an hour when not asleep. This helps prevent blood clots.  You may resume a sexual  relationship in one month or when given the OK by your doctor.  You may return to work once you are cleared by your doctor.  Do not drive a car for 6 weeks or until released by you surgeon.  Do not drive while taking narcotics.  WEIGHT BEARING Weight bearing as tolerated with assist device (walker, cane, etc) as directed, use it as long as suggested by your surgeon or therapist, typically at least 4-6 weeks.  POSTOPERATIVE CONSTIPATION PROTOCOL Constipation - defined medically as fewer than three stools per week and severe constipation as less than one stool per week.  One of the most common issues patients have following surgery is constipation.  Even if you have a regular bowel pattern at home, your normal regimen is likely to be disrupted due to multiple reasons following surgery.  Combination of anesthesia, postoperative narcotics, change in appetite and  fluid intake all can affect your bowels.  In order to avoid complications following surgery, here are some recommendations in order to help you during your recovery period.  Colace (docusate) - Pick up an over-the-counter form of Colace or another stool softener and take twice a day as long as you are requiring postoperative pain medications.  Take with a full glass of water daily.  If you experience loose stools or diarrhea, hold the colace until you stool forms back up.  If your symptoms do not get better within 1 week or if they get worse, check with your doctor.  Dulcolax (bisacodyl) - Pick up over-the-counter and take as directed by the product packaging as needed to assist with the movement of your bowels.  Take with a full glass of water.  Use this product as needed if not relieved by Colace only.   MiraLax (polyethylene glycol) - Pick up over-the-counter to have on hand.  MiraLax is a solution that will increase the amount of water in your bowels to assist with bowel movements.  Take as directed and can mix with a glass of water, juice,  soda, coffee, or tea.  Take if you go more than two days without a movement. Do not use MiraLax more than once per day. Call your doctor if you are still constipated or irregular after using this medication for 7 days in a row.  If you continue to have problems with postoperative constipation, please contact the office for further assistance and recommendations.  If you experience "the worst abdominal pain ever" or develop nausea or vomiting, please contact the office immediatly for further recommendations for treatment.  ITCHING  If you experience itching with your medications, try taking only a single pain pill, or even half a pain pill at a time.  You can also use Benadryl over the counter for itching or also to help with sleep.   TED HOSE STOCKINGS Wear the elastic stockings on both legs for two weeks following surgery during the day but you may remove then at night for sleeping.  Okay to remove ACE in 3 days, put TED on after  MEDICATIONS See your medication summary on the "After Visit Summary" that the nursing staff will review with you prior to discharge.  You may have some home medications which will be placed on hold until you complete the course of blood thinner medication.  It is important for you to complete the blood thinner medication as prescribed by your surgeon.  Continue your approved medications as instructed at time of discharge. If you were not previously taking any blood thinners prior to surgery please start taking Aspirin 325 mg tabs twice daily for 6 weeks. If you are unable to take Aspirin please let your doctor know.   PRECAUTIONS If you experience chest pain or shortness of breath - call 911 immediately for transfer to the hospital emergency department.  If you develop a fever greater that 101 F, purulent drainage from wound, increased redness or drainage from wound, foul odor from the wound/dressing, or calf pain - CONTACT YOUR SURGEON.                                                    FOLLOW-UP APPOINTMENTS Make sure you keep all of your appointments after your operation with your surgeon and caregivers. You  should call the office at the above phone number and make an appointment for approximately two weeks after the date of your surgery or on the date instructed by your surgeon outlined in the "After Visit Summary".   RANGE OF MOTION AND STRENGTHENING EXERCISES  Rehabilitation of the knee is important following a knee injury or an operation. After just a few days of immobilization, the muscles of the thigh which control the knee become weakened and shrink (atrophy). Knee exercises are designed to build up the tone and strength of the thigh muscles and to improve knee motion. Often times heat used for twenty to thirty minutes before working out will loosen up your tissues and help with improving the range of motion but do not use heat for the first two weeks following surgery. These exercises can be done on a training (exercise) mat, on the floor, on a table or on a bed. Use what ever works the best and is most comfortable for you Knee exercises include:  Leg Lifts - While your knee is still immobilized in a splint or cast, you can do straight leg raises. Lift the leg to 60 degrees, hold for 3 sec, and slowly lower the leg. Repeat 10-20 times 2-3 times daily. Perform this exercise against resistance later as your knee gets better.  Quad and Hamstring Sets - Tighten up the muscle on the front of the thigh (Quad) and hold for 5-10 sec. Repeat this 10-20 times hourly. Hamstring sets are done by pushing the foot backward against an object and holding for 5-10 sec. Repeat as with quad sets.  Leg Slides: Lying on your back, slowly slide your foot toward your buttocks, bending your knee up off the floor (only go as far as is comfortable). Then slowly slide your foot back down until your leg is flat on the floor again. Angel Wings: Lying on your back spread your legs to the  side as far apart as you can without causing discomfort.  A rehabilitation program following serious knee injuries can speed recovery and prevent re-injury in the future due to weakened muscles. Contact your doctor or a physical therapist for more information on knee rehabilitation.   IF YOU ARE TRANSFERRED TO A SKILLED REHAB FACILITY If the patient is transferred to a skilled rehab facility following release from the hospital, a list of the current medications will be sent to the facility for the patient to continue.  When discharged from the skilled rehab facility, please have the facility set up the patient's [place] prior to being released. Also, the skilled facility will be responsible for providing the patient with their medications at time of release from the facility to include their pain medication, the muscle relaxants, and their blood thinner medication. If the patient is still at the rehab facility at time of the two week follow up appointment, the skilled rehab facility will also need to assist the patient in arranging follow up appointment in our office and any transportation needs.  MAKE SURE YOU:  Understand these instructions.  Get help right away if you are not doing well or get worse.    Pick up stool softner and laxative for home use following surgery while on pain medications. May shower starting three days after surgery. Please use a clean towel to pat the leg dry following showers. Continue to use ice for pain and swelling after surgery. Do not use any lotions or creams on the incision until instructed by you Start  Aspirin immediately following surgery.   Do not put a pillow under the knee. Place it under the heel.   Complete by: As directed    Do not put a pillow under the knee. Place it under the heel.   Complete by: As directed    Driving restrictions   Complete by: As directed    No driving for two weeks   Driving restrictions   Complete by: As  directed    No driving for two weeks   Post-operative opioid taper instructions:   Complete by: As directed    POST-OPERATIVE OPIOID TAPER INSTRUCTIONS: It is important to wean off of your opioid medication as soon as possible. If you do not need pain medication after your surgery it is ok to stop day one. Opioids include: Codeine, Hydrocodone(Norco, Vicodin), Oxycodone(Percocet, oxycontin) and hydromorphone amongst others.  Long term and even short term use of opiods can cause: Increased pain response Dependence Constipation Depression Respiratory depression And more.  Withdrawal symptoms can include Flu like symptoms Nausea, vomiting And more Techniques to manage these symptoms Hydrate well Eat regular healthy meals Stay active Use relaxation techniques(deep breathing, meditating, yoga) Do Not substitute Alcohol to help with tapering If you have been on opioids for less than two weeks and do not have pain than it is ok to stop all together.  Plan to wean off of opioids This plan should start within one week post op of your joint replacement. Maintain the same interval or time between taking each dose and first decrease the dose.  Cut the total daily intake of opioids by one tablet each day Next start to increase the time between doses. The last dose that should be eliminated is the evening dose.      Post-operative opioid taper instructions:   Complete by: As directed    POST-OPERATIVE OPIOID TAPER INSTRUCTIONS: It is important to wean off of your opioid medication as soon as possible. If you do not need pain medication after your surgery it is ok to stop day one. Opioids include: Codeine, Hydrocodone(Norco, Vicodin), Oxycodone(Percocet, oxycontin) and hydromorphone amongst others.  Long term and even short term use of opiods can cause: Increased pain response Dependence Constipation Depression Respiratory depression And more.  Withdrawal symptoms can include Flu  like symptoms Nausea, vomiting And more Techniques to manage these symptoms Hydrate well Eat regular healthy meals Stay active Use relaxation techniques(deep breathing, meditating, yoga) Do Not substitute Alcohol to help with tapering If you have been on opioids for less than two weeks and do not have pain than it is ok to stop all together.  Plan to wean off of opioids This plan should start within one week post op of your joint replacement. Maintain the same interval or time between taking each dose and first decrease the dose.  Cut the total daily intake of opioids by one tablet each day Next start to increase the time between doses. The last dose that should be eliminated is the evening dose.      TED hose   Complete by: As directed    Use stockings (TED hose) for three weeks on both leg(s).  You may remove them at night for sleeping.   TED hose   Complete by: As directed    Use stockings (TED hose) for three weeks on both leg(s).  You may remove them at night for sleeping.   Weight bearing as tolerated   Complete by: As directed    Weight bearing as  tolerated   Complete by: As directed       Allergies as of 10/29/2021       Reactions   Morphine Other (See Comments)   "becomes unresponsive" denies need for ETT   Codeine Nausea And Vomiting   Tape Other (See Comments)   Tape after last surgery caused skin breakdown        Medication List     TAKE these medications    albuterol 108 (90 Base) MCG/ACT inhaler Commonly known as: VENTOLIN HFA Inhale 1-2 puffs into the lungs every 4 (four) hours as needed for wheezing or shortness of breath. Notes to patient: Resume home regimen   amiodarone 200 MG tablet Commonly known as: PACERONE Take 100 mg by mouth daily.   atorvastatin 40 MG tablet Commonly known as: LIPITOR Take 40 mg by mouth daily.   beta carotene w/minerals tablet Take 1 tablet by mouth 2 (two) times daily. Notes to patient: Resume home regimen    cholecalciferol 1000 units tablet Commonly known as: VITAMIN D Take 1,000 Units by mouth daily. Notes to patient: Resume home regimen   furosemide 40 MG tablet Commonly known as: LASIX Take 1 tablet (40 mg total) by mouth daily. What changed:  how much to take when to take this   gabapentin 300 MG capsule Commonly known as: NEURONTIN Take 1 capsule (300 mg total) by mouth at bedtime.   glimepiride 2 MG tablet Commonly known as: AMARYL Take 2 mg by mouth daily with breakfast.   methocarbamol 500 MG tablet Commonly known as: ROBAXIN Take 1 tablet (500 mg total) by mouth every 6 (six) hours as needed for muscle spasms. Notes to patient: Last dose given 11/23 08:28pm   MULTIVITAMIN PO Take 1 tablet by mouth daily. Notes to patient: Resume home regimen   nitroGLYCERIN 0.4 MG SL tablet Commonly known as: NITROSTAT Place 1 tablet (0.4 mg total) under the tongue every 5 (five) minutes as needed for chest pain. Notes to patient: Resume home regimen   ondansetron 4 MG tablet Commonly known as: ZOFRAN Take 1 tablet (4 mg total) by mouth every 6 (six) hours as needed for nausea. Notes to patient: Last dose given 11/23 09:40am   potassium chloride 10 MEQ tablet Commonly known as: KLOR-CON M Take 1 tablet by mouth daily as needed (when he feels like he needs it). Notes to patient: Resume home regimen   quiniDINE gluconate 324 MG CR tablet Take 324 mg by mouth daily. Notes to patient: Resume home regimen   spironolactone 25 MG tablet Commonly known as: ALDACTONE Take 25 mg by mouth daily. Notes to patient: Resume home regimen   STOOL SOFTENER PO Take 100 mg by mouth daily as needed (constipation). Notes to patient: Resume home regimen   tobramycin 0.3 % ophthalmic solution Commonly known as: TOBREX Place 1 drop into the right eye 2 (two) times daily. Instructed to take eye drop 2 days before and 1 day after eye treatment. Notes to patient: Resume home regimen    traMADol 50 MG tablet Commonly known as: ULTRAM Take 50 mg by mouth every 6 (six) hours as needed for moderate pain. Notes to patient: Last dose given 11/24 05:57am   Vitamin B 12 500 MCG Tabs Take 500 mcg by mouth daily. Notes to patient: Resume home regimen   vitamin C 100 MG tablet Take 100 mg by mouth daily. Notes to patient: Resume home regimen   VITAMIN E PO Take 1,000 Units by mouth daily. Notes to  patient: Resume home regimen       ASK your doctor about these medications    oxyCODONE 5 MG immediate release tablet Commonly known as: Roxicodone Take 1 tablet (5 mg total) by mouth every 4 (four) hours as needed for up to 7 days for severe pain. Ask about: Should I take this medication?               Discharge Care Instructions  (From admission, onward)           Start     Ordered   10/29/21 0000  Weight bearing as tolerated        10/29/21 1155   10/29/21 0000  Change dressing       Comments: You may remove the bulky bandage (ACE wrap and gauze) two days after surgery. You will have an adhesive waterproof bandage underneath. Leave this in place until your first follow-up appointment.   10/29/21 1155   10/28/21 0000  Weight bearing as tolerated        10/28/21 1017             Signed: Margarita Mail R Elzie Sheets 11/11/2021, 8:07 AM

## 2021-11-13 DIAGNOSIS — H1032 Unspecified acute conjunctivitis, left eye: Secondary | ICD-10-CM | POA: Diagnosis not present

## 2021-11-16 DIAGNOSIS — H01004 Unspecified blepharitis left upper eyelid: Secondary | ICD-10-CM | POA: Diagnosis not present

## 2021-11-16 DIAGNOSIS — H01001 Unspecified blepharitis right upper eyelid: Secondary | ICD-10-CM | POA: Diagnosis not present

## 2021-11-16 DIAGNOSIS — Z96651 Presence of right artificial knee joint: Secondary | ICD-10-CM | POA: Diagnosis not present

## 2021-11-23 DIAGNOSIS — B0052 Herpesviral keratitis: Secondary | ICD-10-CM | POA: Diagnosis not present

## 2021-11-26 DIAGNOSIS — B0052 Herpesviral keratitis: Secondary | ICD-10-CM | POA: Diagnosis not present

## 2021-12-03 DIAGNOSIS — B0052 Herpesviral keratitis: Secondary | ICD-10-CM | POA: Diagnosis not present

## 2021-12-15 DIAGNOSIS — M1990 Unspecified osteoarthritis, unspecified site: Secondary | ICD-10-CM | POA: Diagnosis not present

## 2021-12-15 DIAGNOSIS — M25561 Pain in right knee: Secondary | ICD-10-CM | POA: Diagnosis not present

## 2021-12-15 DIAGNOSIS — Z96651 Presence of right artificial knee joint: Secondary | ICD-10-CM | POA: Diagnosis not present

## 2021-12-15 DIAGNOSIS — M25661 Stiffness of right knee, not elsewhere classified: Secondary | ICD-10-CM | POA: Diagnosis not present

## 2021-12-15 DIAGNOSIS — R29898 Other symptoms and signs involving the musculoskeletal system: Secondary | ICD-10-CM | POA: Diagnosis not present

## 2021-12-17 DIAGNOSIS — H1045 Other chronic allergic conjunctivitis: Secondary | ICD-10-CM | POA: Diagnosis not present

## 2021-12-17 DIAGNOSIS — B0052 Herpesviral keratitis: Secondary | ICD-10-CM | POA: Diagnosis not present

## 2021-12-17 DIAGNOSIS — H26491 Other secondary cataract, right eye: Secondary | ICD-10-CM | POA: Diagnosis not present

## 2021-12-23 DIAGNOSIS — Z96651 Presence of right artificial knee joint: Secondary | ICD-10-CM | POA: Diagnosis not present

## 2021-12-23 DIAGNOSIS — M1990 Unspecified osteoarthritis, unspecified site: Secondary | ICD-10-CM | POA: Diagnosis not present

## 2021-12-23 DIAGNOSIS — M25561 Pain in right knee: Secondary | ICD-10-CM | POA: Diagnosis not present

## 2021-12-23 DIAGNOSIS — M25661 Stiffness of right knee, not elsewhere classified: Secondary | ICD-10-CM | POA: Diagnosis not present

## 2021-12-23 DIAGNOSIS — R29898 Other symptoms and signs involving the musculoskeletal system: Secondary | ICD-10-CM | POA: Diagnosis not present

## 2021-12-24 DIAGNOSIS — B0052 Herpesviral keratitis: Secondary | ICD-10-CM | POA: Diagnosis not present

## 2021-12-28 DIAGNOSIS — M25561 Pain in right knee: Secondary | ICD-10-CM | POA: Diagnosis not present

## 2021-12-28 DIAGNOSIS — M1990 Unspecified osteoarthritis, unspecified site: Secondary | ICD-10-CM | POA: Diagnosis not present

## 2021-12-28 DIAGNOSIS — R29898 Other symptoms and signs involving the musculoskeletal system: Secondary | ICD-10-CM | POA: Diagnosis not present

## 2021-12-28 DIAGNOSIS — Z96651 Presence of right artificial knee joint: Secondary | ICD-10-CM | POA: Diagnosis not present

## 2021-12-28 DIAGNOSIS — M25661 Stiffness of right knee, not elsewhere classified: Secondary | ICD-10-CM | POA: Diagnosis not present

## 2021-12-31 DIAGNOSIS — B0052 Herpesviral keratitis: Secondary | ICD-10-CM | POA: Diagnosis not present

## 2021-12-31 DIAGNOSIS — M1990 Unspecified osteoarthritis, unspecified site: Secondary | ICD-10-CM | POA: Diagnosis not present

## 2021-12-31 DIAGNOSIS — R29898 Other symptoms and signs involving the musculoskeletal system: Secondary | ICD-10-CM | POA: Diagnosis not present

## 2021-12-31 DIAGNOSIS — M25561 Pain in right knee: Secondary | ICD-10-CM | POA: Diagnosis not present

## 2021-12-31 DIAGNOSIS — M25661 Stiffness of right knee, not elsewhere classified: Secondary | ICD-10-CM | POA: Diagnosis not present

## 2021-12-31 DIAGNOSIS — Z96651 Presence of right artificial knee joint: Secondary | ICD-10-CM | POA: Diagnosis not present

## 2022-01-04 DIAGNOSIS — M25561 Pain in right knee: Secondary | ICD-10-CM | POA: Diagnosis not present

## 2022-01-04 DIAGNOSIS — R29898 Other symptoms and signs involving the musculoskeletal system: Secondary | ICD-10-CM | POA: Diagnosis not present

## 2022-01-04 DIAGNOSIS — M1990 Unspecified osteoarthritis, unspecified site: Secondary | ICD-10-CM | POA: Diagnosis not present

## 2022-01-04 DIAGNOSIS — M25661 Stiffness of right knee, not elsewhere classified: Secondary | ICD-10-CM | POA: Diagnosis not present

## 2022-01-04 DIAGNOSIS — Z96651 Presence of right artificial knee joint: Secondary | ICD-10-CM | POA: Diagnosis not present

## 2022-01-07 DIAGNOSIS — M25561 Pain in right knee: Secondary | ICD-10-CM | POA: Diagnosis not present

## 2022-01-07 DIAGNOSIS — R29898 Other symptoms and signs involving the musculoskeletal system: Secondary | ICD-10-CM | POA: Diagnosis not present

## 2022-01-07 DIAGNOSIS — Z96651 Presence of right artificial knee joint: Secondary | ICD-10-CM | POA: Diagnosis not present

## 2022-01-07 DIAGNOSIS — M1990 Unspecified osteoarthritis, unspecified site: Secondary | ICD-10-CM | POA: Diagnosis not present

## 2022-01-07 DIAGNOSIS — M25661 Stiffness of right knee, not elsewhere classified: Secondary | ICD-10-CM | POA: Diagnosis not present

## 2022-01-11 DIAGNOSIS — R29898 Other symptoms and signs involving the musculoskeletal system: Secondary | ICD-10-CM | POA: Diagnosis not present

## 2022-01-11 DIAGNOSIS — M25661 Stiffness of right knee, not elsewhere classified: Secondary | ICD-10-CM | POA: Diagnosis not present

## 2022-01-11 DIAGNOSIS — M25561 Pain in right knee: Secondary | ICD-10-CM | POA: Diagnosis not present

## 2022-01-11 DIAGNOSIS — Z96651 Presence of right artificial knee joint: Secondary | ICD-10-CM | POA: Diagnosis not present

## 2022-01-11 DIAGNOSIS — M1990 Unspecified osteoarthritis, unspecified site: Secondary | ICD-10-CM | POA: Diagnosis not present

## 2022-01-14 DIAGNOSIS — M1990 Unspecified osteoarthritis, unspecified site: Secondary | ICD-10-CM | POA: Diagnosis not present

## 2022-01-14 DIAGNOSIS — M25561 Pain in right knee: Secondary | ICD-10-CM | POA: Diagnosis not present

## 2022-01-14 DIAGNOSIS — E1165 Type 2 diabetes mellitus with hyperglycemia: Secondary | ICD-10-CM | POA: Diagnosis not present

## 2022-01-14 DIAGNOSIS — R29898 Other symptoms and signs involving the musculoskeletal system: Secondary | ICD-10-CM | POA: Diagnosis not present

## 2022-01-14 DIAGNOSIS — H353213 Exudative age-related macular degeneration, right eye, with inactive scar: Secondary | ICD-10-CM | POA: Diagnosis not present

## 2022-01-14 DIAGNOSIS — B0052 Herpesviral keratitis: Secondary | ICD-10-CM | POA: Diagnosis not present

## 2022-01-14 DIAGNOSIS — H353221 Exudative age-related macular degeneration, left eye, with active choroidal neovascularization: Secondary | ICD-10-CM | POA: Diagnosis not present

## 2022-01-14 DIAGNOSIS — Z96651 Presence of right artificial knee joint: Secondary | ICD-10-CM | POA: Diagnosis not present

## 2022-01-14 DIAGNOSIS — M25661 Stiffness of right knee, not elsewhere classified: Secondary | ICD-10-CM | POA: Diagnosis not present

## 2022-01-18 DIAGNOSIS — Z4789 Encounter for other orthopedic aftercare: Secondary | ICD-10-CM | POA: Diagnosis not present

## 2022-01-25 DIAGNOSIS — M1990 Unspecified osteoarthritis, unspecified site: Secondary | ICD-10-CM | POA: Diagnosis not present

## 2022-01-25 DIAGNOSIS — M25661 Stiffness of right knee, not elsewhere classified: Secondary | ICD-10-CM | POA: Diagnosis not present

## 2022-01-25 DIAGNOSIS — R29898 Other symptoms and signs involving the musculoskeletal system: Secondary | ICD-10-CM | POA: Diagnosis not present

## 2022-01-25 DIAGNOSIS — M25561 Pain in right knee: Secondary | ICD-10-CM | POA: Diagnosis not present

## 2022-01-25 DIAGNOSIS — Z96651 Presence of right artificial knee joint: Secondary | ICD-10-CM | POA: Diagnosis not present

## 2022-01-28 DIAGNOSIS — M25661 Stiffness of right knee, not elsewhere classified: Secondary | ICD-10-CM | POA: Diagnosis not present

## 2022-01-28 DIAGNOSIS — M25561 Pain in right knee: Secondary | ICD-10-CM | POA: Diagnosis not present

## 2022-01-28 DIAGNOSIS — R29898 Other symptoms and signs involving the musculoskeletal system: Secondary | ICD-10-CM | POA: Diagnosis not present

## 2022-01-28 DIAGNOSIS — M1990 Unspecified osteoarthritis, unspecified site: Secondary | ICD-10-CM | POA: Diagnosis not present

## 2022-01-28 DIAGNOSIS — Z96651 Presence of right artificial knee joint: Secondary | ICD-10-CM | POA: Diagnosis not present

## 2022-02-01 DIAGNOSIS — Z96651 Presence of right artificial knee joint: Secondary | ICD-10-CM | POA: Diagnosis not present

## 2022-02-01 DIAGNOSIS — M1990 Unspecified osteoarthritis, unspecified site: Secondary | ICD-10-CM | POA: Diagnosis not present

## 2022-02-01 DIAGNOSIS — M25561 Pain in right knee: Secondary | ICD-10-CM | POA: Diagnosis not present

## 2022-02-01 DIAGNOSIS — R29898 Other symptoms and signs involving the musculoskeletal system: Secondary | ICD-10-CM | POA: Diagnosis not present

## 2022-02-01 DIAGNOSIS — M25661 Stiffness of right knee, not elsewhere classified: Secondary | ICD-10-CM | POA: Diagnosis not present

## 2022-02-04 DIAGNOSIS — R29898 Other symptoms and signs involving the musculoskeletal system: Secondary | ICD-10-CM | POA: Diagnosis not present

## 2022-02-04 DIAGNOSIS — M25661 Stiffness of right knee, not elsewhere classified: Secondary | ICD-10-CM | POA: Diagnosis not present

## 2022-02-04 DIAGNOSIS — Z96651 Presence of right artificial knee joint: Secondary | ICD-10-CM | POA: Diagnosis not present

## 2022-02-04 DIAGNOSIS — M25561 Pain in right knee: Secondary | ICD-10-CM | POA: Diagnosis not present

## 2022-02-04 DIAGNOSIS — M1990 Unspecified osteoarthritis, unspecified site: Secondary | ICD-10-CM | POA: Diagnosis not present

## 2022-02-09 DIAGNOSIS — Z45010 Encounter for checking and testing of cardiac pacemaker pulse generator [battery]: Secondary | ICD-10-CM | POA: Diagnosis not present

## 2022-02-09 DIAGNOSIS — I472 Ventricular tachycardia, unspecified: Secondary | ICD-10-CM | POA: Diagnosis not present

## 2022-02-11 DIAGNOSIS — Z4789 Encounter for other orthopedic aftercare: Secondary | ICD-10-CM | POA: Diagnosis not present

## 2022-02-11 DIAGNOSIS — Z96651 Presence of right artificial knee joint: Secondary | ICD-10-CM | POA: Diagnosis not present

## 2022-02-15 DIAGNOSIS — M25561 Pain in right knee: Secondary | ICD-10-CM | POA: Diagnosis not present

## 2022-02-15 DIAGNOSIS — Z96651 Presence of right artificial knee joint: Secondary | ICD-10-CM | POA: Diagnosis not present

## 2022-02-15 DIAGNOSIS — M1990 Unspecified osteoarthritis, unspecified site: Secondary | ICD-10-CM | POA: Diagnosis not present

## 2022-02-15 DIAGNOSIS — M25661 Stiffness of right knee, not elsewhere classified: Secondary | ICD-10-CM | POA: Diagnosis not present

## 2022-02-15 DIAGNOSIS — R29898 Other symptoms and signs involving the musculoskeletal system: Secondary | ICD-10-CM | POA: Diagnosis not present

## 2022-02-19 DIAGNOSIS — B0052 Herpesviral keratitis: Secondary | ICD-10-CM | POA: Diagnosis not present

## 2022-02-22 DIAGNOSIS — M9901 Segmental and somatic dysfunction of cervical region: Secondary | ICD-10-CM | POA: Diagnosis not present

## 2022-02-22 DIAGNOSIS — M9903 Segmental and somatic dysfunction of lumbar region: Secondary | ICD-10-CM | POA: Diagnosis not present

## 2022-02-22 DIAGNOSIS — M531 Cervicobrachial syndrome: Secondary | ICD-10-CM | POA: Diagnosis not present

## 2022-02-22 DIAGNOSIS — M9905 Segmental and somatic dysfunction of pelvic region: Secondary | ICD-10-CM | POA: Diagnosis not present

## 2022-02-24 DIAGNOSIS — I472 Ventricular tachycardia, unspecified: Secondary | ICD-10-CM | POA: Diagnosis not present

## 2022-02-24 DIAGNOSIS — Z4502 Encounter for adjustment and management of automatic implantable cardiac defibrillator: Secondary | ICD-10-CM | POA: Diagnosis not present

## 2022-03-01 DIAGNOSIS — M9905 Segmental and somatic dysfunction of pelvic region: Secondary | ICD-10-CM | POA: Diagnosis not present

## 2022-03-01 DIAGNOSIS — M9903 Segmental and somatic dysfunction of lumbar region: Secondary | ICD-10-CM | POA: Diagnosis not present

## 2022-03-01 DIAGNOSIS — M531 Cervicobrachial syndrome: Secondary | ICD-10-CM | POA: Diagnosis not present

## 2022-03-01 DIAGNOSIS — M9901 Segmental and somatic dysfunction of cervical region: Secondary | ICD-10-CM | POA: Diagnosis not present

## 2022-03-02 DIAGNOSIS — R29898 Other symptoms and signs involving the musculoskeletal system: Secondary | ICD-10-CM | POA: Diagnosis not present

## 2022-03-02 DIAGNOSIS — M25661 Stiffness of right knee, not elsewhere classified: Secondary | ICD-10-CM | POA: Diagnosis not present

## 2022-03-02 DIAGNOSIS — M25561 Pain in right knee: Secondary | ICD-10-CM | POA: Diagnosis not present

## 2022-03-02 DIAGNOSIS — Z96651 Presence of right artificial knee joint: Secondary | ICD-10-CM | POA: Diagnosis not present

## 2022-03-02 DIAGNOSIS — M1990 Unspecified osteoarthritis, unspecified site: Secondary | ICD-10-CM | POA: Diagnosis not present

## 2022-03-04 DIAGNOSIS — I5022 Chronic systolic (congestive) heart failure: Secondary | ICD-10-CM | POA: Diagnosis not present

## 2022-03-04 DIAGNOSIS — E782 Mixed hyperlipidemia: Secondary | ICD-10-CM | POA: Diagnosis not present

## 2022-03-04 DIAGNOSIS — I482 Chronic atrial fibrillation, unspecified: Secondary | ICD-10-CM | POA: Diagnosis not present

## 2022-03-04 DIAGNOSIS — Z7984 Long term (current) use of oral hypoglycemic drugs: Secondary | ICD-10-CM | POA: Diagnosis not present

## 2022-03-04 DIAGNOSIS — Z79899 Other long term (current) drug therapy: Secondary | ICD-10-CM | POA: Diagnosis not present

## 2022-03-04 DIAGNOSIS — I5020 Unspecified systolic (congestive) heart failure: Secondary | ICD-10-CM | POA: Diagnosis not present

## 2022-03-04 DIAGNOSIS — Z885 Allergy status to narcotic agent status: Secondary | ICD-10-CM | POA: Diagnosis not present

## 2022-03-04 DIAGNOSIS — M25569 Pain in unspecified knee: Secondary | ICD-10-CM | POA: Diagnosis not present

## 2022-03-04 DIAGNOSIS — Z951 Presence of aortocoronary bypass graft: Secondary | ICD-10-CM | POA: Diagnosis not present

## 2022-03-04 DIAGNOSIS — I110 Hypertensive heart disease with heart failure: Secondary | ICD-10-CM | POA: Diagnosis not present

## 2022-03-04 DIAGNOSIS — I2510 Atherosclerotic heart disease of native coronary artery without angina pectoris: Secondary | ICD-10-CM | POA: Diagnosis not present

## 2022-03-04 DIAGNOSIS — Z888 Allergy status to other drugs, medicaments and biological substances status: Secondary | ICD-10-CM | POA: Diagnosis not present

## 2022-03-08 DIAGNOSIS — M531 Cervicobrachial syndrome: Secondary | ICD-10-CM | POA: Diagnosis not present

## 2022-03-08 DIAGNOSIS — M9901 Segmental and somatic dysfunction of cervical region: Secondary | ICD-10-CM | POA: Diagnosis not present

## 2022-03-08 DIAGNOSIS — M9905 Segmental and somatic dysfunction of pelvic region: Secondary | ICD-10-CM | POA: Diagnosis not present

## 2022-03-08 DIAGNOSIS — M9903 Segmental and somatic dysfunction of lumbar region: Secondary | ICD-10-CM | POA: Diagnosis not present

## 2022-03-17 DIAGNOSIS — H353213 Exudative age-related macular degeneration, right eye, with inactive scar: Secondary | ICD-10-CM | POA: Diagnosis not present

## 2022-03-17 DIAGNOSIS — E113292 Type 2 diabetes mellitus with mild nonproliferative diabetic retinopathy without macular edema, left eye: Secondary | ICD-10-CM | POA: Diagnosis not present

## 2022-03-17 DIAGNOSIS — B0052 Herpesviral keratitis: Secondary | ICD-10-CM | POA: Diagnosis not present

## 2022-03-17 DIAGNOSIS — H353222 Exudative age-related macular degeneration, left eye, with inactive choroidal neovascularization: Secondary | ICD-10-CM | POA: Diagnosis not present

## 2022-03-22 DIAGNOSIS — M9905 Segmental and somatic dysfunction of pelvic region: Secondary | ICD-10-CM | POA: Diagnosis not present

## 2022-03-22 DIAGNOSIS — M9901 Segmental and somatic dysfunction of cervical region: Secondary | ICD-10-CM | POA: Diagnosis not present

## 2022-03-22 DIAGNOSIS — M531 Cervicobrachial syndrome: Secondary | ICD-10-CM | POA: Diagnosis not present

## 2022-03-22 DIAGNOSIS — M9903 Segmental and somatic dysfunction of lumbar region: Secondary | ICD-10-CM | POA: Diagnosis not present

## 2022-03-26 DIAGNOSIS — L570 Actinic keratosis: Secondary | ICD-10-CM | POA: Diagnosis not present

## 2022-03-26 DIAGNOSIS — D044 Carcinoma in situ of skin of scalp and neck: Secondary | ICD-10-CM | POA: Diagnosis not present

## 2022-03-26 DIAGNOSIS — X32XXXD Exposure to sunlight, subsequent encounter: Secondary | ICD-10-CM | POA: Diagnosis not present

## 2022-03-26 DIAGNOSIS — B078 Other viral warts: Secondary | ICD-10-CM | POA: Diagnosis not present

## 2022-04-05 DIAGNOSIS — M9901 Segmental and somatic dysfunction of cervical region: Secondary | ICD-10-CM | POA: Diagnosis not present

## 2022-04-05 DIAGNOSIS — M9905 Segmental and somatic dysfunction of pelvic region: Secondary | ICD-10-CM | POA: Diagnosis not present

## 2022-04-05 DIAGNOSIS — M531 Cervicobrachial syndrome: Secondary | ICD-10-CM | POA: Diagnosis not present

## 2022-04-05 DIAGNOSIS — M9903 Segmental and somatic dysfunction of lumbar region: Secondary | ICD-10-CM | POA: Diagnosis not present

## 2022-04-13 DIAGNOSIS — S0502XA Injury of conjunctiva and corneal abrasion without foreign body, left eye, initial encounter: Secondary | ICD-10-CM | POA: Diagnosis not present

## 2022-04-13 DIAGNOSIS — H353134 Nonexudative age-related macular degeneration, bilateral, advanced atrophic with subfoveal involvement: Secondary | ICD-10-CM | POA: Diagnosis not present

## 2022-04-13 DIAGNOSIS — H353232 Exudative age-related macular degeneration, bilateral, with inactive choroidal neovascularization: Secondary | ICD-10-CM | POA: Diagnosis not present

## 2022-04-19 DIAGNOSIS — M9901 Segmental and somatic dysfunction of cervical region: Secondary | ICD-10-CM | POA: Diagnosis not present

## 2022-04-19 DIAGNOSIS — M531 Cervicobrachial syndrome: Secondary | ICD-10-CM | POA: Diagnosis not present

## 2022-04-19 DIAGNOSIS — M9905 Segmental and somatic dysfunction of pelvic region: Secondary | ICD-10-CM | POA: Diagnosis not present

## 2022-04-19 DIAGNOSIS — M9903 Segmental and somatic dysfunction of lumbar region: Secondary | ICD-10-CM | POA: Diagnosis not present

## 2022-04-21 DIAGNOSIS — S0502XD Injury of conjunctiva and corneal abrasion without foreign body, left eye, subsequent encounter: Secondary | ICD-10-CM | POA: Diagnosis not present

## 2022-04-22 DIAGNOSIS — B349 Viral infection, unspecified: Secondary | ICD-10-CM | POA: Diagnosis not present

## 2022-04-22 DIAGNOSIS — J302 Other seasonal allergic rhinitis: Secondary | ICD-10-CM | POA: Diagnosis not present

## 2022-04-22 DIAGNOSIS — Z20822 Contact with and (suspected) exposure to covid-19: Secondary | ICD-10-CM | POA: Diagnosis not present

## 2022-04-23 DIAGNOSIS — Z08 Encounter for follow-up examination after completed treatment for malignant neoplasm: Secondary | ICD-10-CM | POA: Diagnosis not present

## 2022-04-23 DIAGNOSIS — X32XXXD Exposure to sunlight, subsequent encounter: Secondary | ICD-10-CM | POA: Diagnosis not present

## 2022-04-23 DIAGNOSIS — L570 Actinic keratosis: Secondary | ICD-10-CM | POA: Diagnosis not present

## 2022-04-23 DIAGNOSIS — Z85828 Personal history of other malignant neoplasm of skin: Secondary | ICD-10-CM | POA: Diagnosis not present

## 2022-04-23 DIAGNOSIS — U071 COVID-19: Secondary | ICD-10-CM | POA: Diagnosis not present

## 2022-04-30 DIAGNOSIS — H16232 Neurotrophic keratoconjunctivitis, left eye: Secondary | ICD-10-CM | POA: Diagnosis not present

## 2022-04-30 DIAGNOSIS — B0052 Herpesviral keratitis: Secondary | ICD-10-CM | POA: Diagnosis not present

## 2022-04-30 DIAGNOSIS — B0089 Other herpesviral infection: Secondary | ICD-10-CM | POA: Diagnosis not present

## 2022-05-06 DIAGNOSIS — M9905 Segmental and somatic dysfunction of pelvic region: Secondary | ICD-10-CM | POA: Diagnosis not present

## 2022-05-06 DIAGNOSIS — M531 Cervicobrachial syndrome: Secondary | ICD-10-CM | POA: Diagnosis not present

## 2022-05-06 DIAGNOSIS — M9903 Segmental and somatic dysfunction of lumbar region: Secondary | ICD-10-CM | POA: Diagnosis not present

## 2022-05-06 DIAGNOSIS — M9901 Segmental and somatic dysfunction of cervical region: Secondary | ICD-10-CM | POA: Diagnosis not present

## 2022-05-11 DIAGNOSIS — Z4502 Encounter for adjustment and management of automatic implantable cardiac defibrillator: Secondary | ICD-10-CM | POA: Diagnosis not present

## 2022-05-11 DIAGNOSIS — I472 Ventricular tachycardia, unspecified: Secondary | ICD-10-CM | POA: Diagnosis not present

## 2022-05-13 DIAGNOSIS — H16232 Neurotrophic keratoconjunctivitis, left eye: Secondary | ICD-10-CM | POA: Diagnosis not present

## 2022-05-13 DIAGNOSIS — B0052 Herpesviral keratitis: Secondary | ICD-10-CM | POA: Diagnosis not present

## 2022-05-18 DIAGNOSIS — E119 Type 2 diabetes mellitus without complications: Secondary | ICD-10-CM | POA: Diagnosis not present

## 2022-05-18 DIAGNOSIS — I5022 Chronic systolic (congestive) heart failure: Secondary | ICD-10-CM | POA: Diagnosis not present

## 2022-05-18 DIAGNOSIS — E7800 Pure hypercholesterolemia, unspecified: Secondary | ICD-10-CM | POA: Diagnosis not present

## 2022-05-18 DIAGNOSIS — I2510 Atherosclerotic heart disease of native coronary artery without angina pectoris: Secondary | ICD-10-CM | POA: Diagnosis not present

## 2022-05-18 DIAGNOSIS — I10 Essential (primary) hypertension: Secondary | ICD-10-CM | POA: Diagnosis not present

## 2022-05-18 DIAGNOSIS — I480 Paroxysmal atrial fibrillation: Secondary | ICD-10-CM | POA: Diagnosis not present

## 2022-05-18 DIAGNOSIS — Z96651 Presence of right artificial knee joint: Secondary | ICD-10-CM | POA: Diagnosis not present

## 2022-05-24 DIAGNOSIS — R0609 Other forms of dyspnea: Secondary | ICD-10-CM | POA: Diagnosis not present

## 2022-05-24 DIAGNOSIS — I440 Atrioventricular block, first degree: Secondary | ICD-10-CM | POA: Diagnosis not present

## 2022-05-24 DIAGNOSIS — R9431 Abnormal electrocardiogram [ECG] [EKG]: Secondary | ICD-10-CM | POA: Diagnosis not present

## 2022-05-24 DIAGNOSIS — I4891 Unspecified atrial fibrillation: Secondary | ICD-10-CM | POA: Diagnosis not present

## 2022-05-24 DIAGNOSIS — Z95810 Presence of automatic (implantable) cardiac defibrillator: Secondary | ICD-10-CM | POA: Diagnosis not present

## 2022-05-24 DIAGNOSIS — I255 Ischemic cardiomyopathy: Secondary | ICD-10-CM | POA: Diagnosis not present

## 2022-05-24 DIAGNOSIS — I5020 Unspecified systolic (congestive) heart failure: Secondary | ICD-10-CM | POA: Diagnosis not present

## 2022-05-24 DIAGNOSIS — Z79899 Other long term (current) drug therapy: Secondary | ICD-10-CM | POA: Diagnosis not present

## 2022-05-24 DIAGNOSIS — Z9889 Other specified postprocedural states: Secondary | ICD-10-CM | POA: Diagnosis not present

## 2022-05-24 DIAGNOSIS — E669 Obesity, unspecified: Secondary | ICD-10-CM | POA: Diagnosis not present

## 2022-05-24 DIAGNOSIS — I493 Ventricular premature depolarization: Secondary | ICD-10-CM | POA: Diagnosis not present

## 2022-05-24 DIAGNOSIS — Z4502 Encounter for adjustment and management of automatic implantable cardiac defibrillator: Secondary | ICD-10-CM | POA: Diagnosis not present

## 2022-05-24 DIAGNOSIS — I499 Cardiac arrhythmia, unspecified: Secondary | ICD-10-CM | POA: Diagnosis not present

## 2022-05-27 DIAGNOSIS — B0052 Herpesviral keratitis: Secondary | ICD-10-CM | POA: Diagnosis not present

## 2022-05-27 DIAGNOSIS — S0502XS Injury of conjunctiva and corneal abrasion without foreign body, left eye, sequela: Secondary | ICD-10-CM | POA: Diagnosis not present

## 2022-05-27 DIAGNOSIS — H16232 Neurotrophic keratoconjunctivitis, left eye: Secondary | ICD-10-CM | POA: Diagnosis not present

## 2022-05-31 ENCOUNTER — Ambulatory Visit
Admission: RE | Admit: 2022-05-31 | Discharge: 2022-05-31 | Disposition: A | Discharge status: Home / Self Care | Payer: Medicare Other | Source: Ambulatory Visit | Attending: Family Medicine | Admitting: Family Medicine

## 2022-05-31 ENCOUNTER — Other Ambulatory Visit: Payer: Self-pay | Admitting: Family Medicine

## 2022-05-31 DIAGNOSIS — I509 Heart failure, unspecified: Secondary | ICD-10-CM | POA: Diagnosis not present

## 2022-05-31 DIAGNOSIS — M47814 Spondylosis without myelopathy or radiculopathy, thoracic region: Secondary | ICD-10-CM | POA: Diagnosis not present

## 2022-05-31 DIAGNOSIS — I5022 Chronic systolic (congestive) heart failure: Secondary | ICD-10-CM

## 2022-06-04 DIAGNOSIS — S0502XS Injury of conjunctiva and corneal abrasion without foreign body, left eye, sequela: Secondary | ICD-10-CM | POA: Diagnosis not present

## 2022-06-04 DIAGNOSIS — B0052 Herpesviral keratitis: Secondary | ICD-10-CM | POA: Diagnosis not present

## 2022-06-04 DIAGNOSIS — H16232 Neurotrophic keratoconjunctivitis, left eye: Secondary | ICD-10-CM | POA: Diagnosis not present

## 2022-06-05 DIAGNOSIS — H353222 Exudative age-related macular degeneration, left eye, with inactive choroidal neovascularization: Secondary | ICD-10-CM | POA: Diagnosis not present

## 2022-06-05 DIAGNOSIS — H5712 Ocular pain, left eye: Secondary | ICD-10-CM | POA: Diagnosis not present

## 2022-06-05 DIAGNOSIS — H35372 Puckering of macula, left eye: Secondary | ICD-10-CM | POA: Diagnosis not present

## 2022-06-05 DIAGNOSIS — H16392 Other interstitial and deep keratitis, left eye: Secondary | ICD-10-CM | POA: Diagnosis not present

## 2022-06-05 DIAGNOSIS — H11432 Conjunctival hyperemia, left eye: Secondary | ICD-10-CM | POA: Diagnosis not present

## 2022-06-05 DIAGNOSIS — H353114 Nonexudative age-related macular degeneration, right eye, advanced atrophic with subfoveal involvement: Secondary | ICD-10-CM | POA: Diagnosis not present

## 2022-06-11 DIAGNOSIS — S0502XS Injury of conjunctiva and corneal abrasion without foreign body, left eye, sequela: Secondary | ICD-10-CM | POA: Diagnosis not present

## 2022-06-11 DIAGNOSIS — B0052 Herpesviral keratitis: Secondary | ICD-10-CM | POA: Diagnosis not present

## 2022-06-11 DIAGNOSIS — H16232 Neurotrophic keratoconjunctivitis, left eye: Secondary | ICD-10-CM | POA: Diagnosis not present

## 2022-06-17 DIAGNOSIS — B0052 Herpesviral keratitis: Secondary | ICD-10-CM | POA: Diagnosis not present

## 2022-07-02 DIAGNOSIS — B0052 Herpesviral keratitis: Secondary | ICD-10-CM | POA: Diagnosis not present

## 2022-07-02 DIAGNOSIS — H16232 Neurotrophic keratoconjunctivitis, left eye: Secondary | ICD-10-CM | POA: Diagnosis not present

## 2022-07-08 DIAGNOSIS — R600 Localized edema: Secondary | ICD-10-CM | POA: Diagnosis not present

## 2022-07-08 DIAGNOSIS — L0390 Cellulitis, unspecified: Secondary | ICD-10-CM | POA: Diagnosis not present

## 2022-07-19 DIAGNOSIS — E1165 Type 2 diabetes mellitus with hyperglycemia: Secondary | ICD-10-CM | POA: Diagnosis not present

## 2022-07-19 DIAGNOSIS — I10 Essential (primary) hypertension: Secondary | ICD-10-CM | POA: Diagnosis not present

## 2022-07-19 DIAGNOSIS — E785 Hyperlipidemia, unspecified: Secondary | ICD-10-CM | POA: Diagnosis not present

## 2022-07-19 DIAGNOSIS — I2510 Atherosclerotic heart disease of native coronary artery without angina pectoris: Secondary | ICD-10-CM | POA: Diagnosis not present

## 2022-07-19 DIAGNOSIS — I5022 Chronic systolic (congestive) heart failure: Secondary | ICD-10-CM | POA: Diagnosis not present

## 2022-07-22 DIAGNOSIS — H179 Unspecified corneal scar and opacity: Secondary | ICD-10-CM | POA: Diagnosis not present

## 2022-07-29 DIAGNOSIS — H524 Presbyopia: Secondary | ICD-10-CM | POA: Diagnosis not present

## 2022-07-29 DIAGNOSIS — H16232 Neurotrophic keratoconjunctivitis, left eye: Secondary | ICD-10-CM | POA: Diagnosis not present

## 2022-07-29 DIAGNOSIS — H353233 Exudative age-related macular degeneration, bilateral, with inactive scar: Secondary | ICD-10-CM | POA: Diagnosis not present

## 2022-07-29 DIAGNOSIS — E119 Type 2 diabetes mellitus without complications: Secondary | ICD-10-CM | POA: Diagnosis not present

## 2022-08-03 DIAGNOSIS — Z4789 Encounter for other orthopedic aftercare: Secondary | ICD-10-CM | POA: Diagnosis not present

## 2022-08-04 DIAGNOSIS — Z79899 Other long term (current) drug therapy: Secondary | ICD-10-CM | POA: Diagnosis not present

## 2022-08-04 DIAGNOSIS — Z7689 Persons encountering health services in other specified circumstances: Secondary | ICD-10-CM | POA: Diagnosis not present

## 2022-08-10 DIAGNOSIS — Z4502 Encounter for adjustment and management of automatic implantable cardiac defibrillator: Secondary | ICD-10-CM | POA: Diagnosis not present

## 2022-08-10 DIAGNOSIS — I472 Ventricular tachycardia, unspecified: Secondary | ICD-10-CM | POA: Diagnosis not present

## 2022-08-12 DIAGNOSIS — E1165 Type 2 diabetes mellitus with hyperglycemia: Secondary | ICD-10-CM | POA: Diagnosis not present

## 2022-08-12 DIAGNOSIS — I2510 Atherosclerotic heart disease of native coronary artery without angina pectoris: Secondary | ICD-10-CM | POA: Diagnosis not present

## 2022-08-12 DIAGNOSIS — E785 Hyperlipidemia, unspecified: Secondary | ICD-10-CM | POA: Diagnosis not present

## 2022-08-12 DIAGNOSIS — I10 Essential (primary) hypertension: Secondary | ICD-10-CM | POA: Diagnosis not present

## 2022-08-12 DIAGNOSIS — I5022 Chronic systolic (congestive) heart failure: Secondary | ICD-10-CM | POA: Diagnosis not present

## 2022-08-17 DIAGNOSIS — I4729 Other ventricular tachycardia: Secondary | ICD-10-CM | POA: Diagnosis not present

## 2022-08-17 DIAGNOSIS — Z4502 Encounter for adjustment and management of automatic implantable cardiac defibrillator: Secondary | ICD-10-CM | POA: Diagnosis not present

## 2022-08-26 DIAGNOSIS — M19072 Primary osteoarthritis, left ankle and foot: Secondary | ICD-10-CM | POA: Diagnosis not present

## 2022-08-26 DIAGNOSIS — M85872 Other specified disorders of bone density and structure, left ankle and foot: Secondary | ICD-10-CM | POA: Diagnosis not present

## 2022-08-26 DIAGNOSIS — M7732 Calcaneal spur, left foot: Secondary | ICD-10-CM | POA: Diagnosis not present

## 2022-08-26 DIAGNOSIS — I70202 Unspecified atherosclerosis of native arteries of extremities, left leg: Secondary | ICD-10-CM | POA: Diagnosis not present

## 2022-08-26 DIAGNOSIS — M79672 Pain in left foot: Secondary | ICD-10-CM | POA: Diagnosis not present

## 2022-08-26 DIAGNOSIS — M722 Plantar fascial fibromatosis: Secondary | ICD-10-CM | POA: Diagnosis not present

## 2022-08-27 DIAGNOSIS — B309 Viral conjunctivitis, unspecified: Secondary | ICD-10-CM | POA: Diagnosis not present

## 2022-09-02 DIAGNOSIS — H179 Unspecified corneal scar and opacity: Secondary | ICD-10-CM | POA: Diagnosis not present

## 2022-09-02 DIAGNOSIS — B309 Viral conjunctivitis, unspecified: Secondary | ICD-10-CM | POA: Diagnosis not present

## 2022-09-06 DIAGNOSIS — T8484XA Pain due to internal orthopedic prosthetic devices, implants and grafts, initial encounter: Secondary | ICD-10-CM | POA: Diagnosis not present

## 2022-09-06 DIAGNOSIS — I999 Unspecified disorder of circulatory system: Secondary | ICD-10-CM | POA: Diagnosis not present

## 2022-09-06 DIAGNOSIS — M25562 Pain in left knee: Secondary | ICD-10-CM | POA: Diagnosis not present

## 2022-09-06 DIAGNOSIS — M25561 Pain in right knee: Secondary | ICD-10-CM | POA: Diagnosis not present

## 2022-09-06 DIAGNOSIS — G8929 Other chronic pain: Secondary | ICD-10-CM | POA: Diagnosis not present

## 2022-09-06 DIAGNOSIS — Z96651 Presence of right artificial knee joint: Secondary | ICD-10-CM | POA: Diagnosis not present

## 2022-09-06 DIAGNOSIS — M25461 Effusion, right knee: Secondary | ICD-10-CM | POA: Diagnosis not present

## 2022-09-07 DIAGNOSIS — I5022 Chronic systolic (congestive) heart failure: Secondary | ICD-10-CM | POA: Diagnosis not present

## 2022-09-07 DIAGNOSIS — I5020 Unspecified systolic (congestive) heart failure: Secondary | ICD-10-CM | POA: Diagnosis not present

## 2022-09-07 DIAGNOSIS — Z951 Presence of aortocoronary bypass graft: Secondary | ICD-10-CM | POA: Diagnosis not present

## 2022-09-07 DIAGNOSIS — Z79899 Other long term (current) drug therapy: Secondary | ICD-10-CM | POA: Diagnosis not present

## 2022-09-07 DIAGNOSIS — I2510 Atherosclerotic heart disease of native coronary artery without angina pectoris: Secondary | ICD-10-CM | POA: Diagnosis not present

## 2022-09-07 DIAGNOSIS — E782 Mixed hyperlipidemia: Secondary | ICD-10-CM | POA: Diagnosis not present

## 2022-09-07 DIAGNOSIS — I110 Hypertensive heart disease with heart failure: Secondary | ICD-10-CM | POA: Diagnosis not present

## 2022-09-16 DIAGNOSIS — R700 Elevated erythrocyte sedimentation rate: Secondary | ICD-10-CM | POA: Diagnosis not present

## 2022-09-16 DIAGNOSIS — T8484XD Pain due to internal orthopedic prosthetic devices, implants and grafts, subsequent encounter: Secondary | ICD-10-CM | POA: Diagnosis not present

## 2022-09-16 DIAGNOSIS — Z96651 Presence of right artificial knee joint: Secondary | ICD-10-CM | POA: Diagnosis not present

## 2022-09-24 DIAGNOSIS — E7800 Pure hypercholesterolemia, unspecified: Secondary | ICD-10-CM | POA: Diagnosis not present

## 2022-09-24 DIAGNOSIS — I2510 Atherosclerotic heart disease of native coronary artery without angina pectoris: Secondary | ICD-10-CM | POA: Diagnosis not present

## 2022-09-24 DIAGNOSIS — E1165 Type 2 diabetes mellitus with hyperglycemia: Secondary | ICD-10-CM | POA: Diagnosis not present

## 2022-09-24 DIAGNOSIS — I10 Essential (primary) hypertension: Secondary | ICD-10-CM | POA: Diagnosis not present

## 2022-09-24 DIAGNOSIS — I5022 Chronic systolic (congestive) heart failure: Secondary | ICD-10-CM | POA: Diagnosis not present

## 2022-10-08 DIAGNOSIS — H353233 Exudative age-related macular degeneration, bilateral, with inactive scar: Secondary | ICD-10-CM | POA: Diagnosis not present

## 2022-10-08 DIAGNOSIS — H1789 Other corneal scars and opacities: Secondary | ICD-10-CM | POA: Diagnosis not present

## 2022-10-21 DIAGNOSIS — J984 Other disorders of lung: Secondary | ICD-10-CM | POA: Diagnosis not present

## 2022-10-27 DIAGNOSIS — I10 Essential (primary) hypertension: Secondary | ICD-10-CM | POA: Diagnosis not present

## 2022-10-27 DIAGNOSIS — I4891 Unspecified atrial fibrillation: Secondary | ICD-10-CM | POA: Diagnosis not present

## 2022-10-27 DIAGNOSIS — Z8669 Personal history of other diseases of the nervous system and sense organs: Secondary | ICD-10-CM | POA: Diagnosis not present

## 2022-10-27 DIAGNOSIS — I872 Venous insufficiency (chronic) (peripheral): Secondary | ICD-10-CM | POA: Diagnosis not present

## 2022-10-27 DIAGNOSIS — Z951 Presence of aortocoronary bypass graft: Secondary | ICD-10-CM | POA: Diagnosis not present

## 2022-11-03 DIAGNOSIS — T8484XD Pain due to internal orthopedic prosthetic devices, implants and grafts, subsequent encounter: Secondary | ICD-10-CM | POA: Diagnosis not present

## 2022-11-03 DIAGNOSIS — Z888 Allergy status to other drugs, medicaments and biological substances status: Secondary | ICD-10-CM | POA: Diagnosis not present

## 2022-11-03 DIAGNOSIS — Z885 Allergy status to narcotic agent status: Secondary | ICD-10-CM | POA: Diagnosis not present

## 2022-11-03 DIAGNOSIS — Z96651 Presence of right artificial knee joint: Secondary | ICD-10-CM | POA: Diagnosis not present

## 2022-11-10 DIAGNOSIS — R0989 Other specified symptoms and signs involving the circulatory and respiratory systems: Secondary | ICD-10-CM | POA: Diagnosis not present

## 2022-11-10 DIAGNOSIS — J90 Pleural effusion, not elsewhere classified: Secondary | ICD-10-CM | POA: Diagnosis not present

## 2022-11-10 DIAGNOSIS — R0609 Other forms of dyspnea: Secondary | ICD-10-CM | POA: Diagnosis not present

## 2022-11-10 DIAGNOSIS — I517 Cardiomegaly: Secondary | ICD-10-CM | POA: Diagnosis not present

## 2022-11-10 DIAGNOSIS — J984 Other disorders of lung: Secondary | ICD-10-CM | POA: Diagnosis not present

## 2022-11-15 DIAGNOSIS — Z95810 Presence of automatic (implantable) cardiac defibrillator: Secondary | ICD-10-CM | POA: Diagnosis not present

## 2022-11-15 DIAGNOSIS — Z4502 Encounter for adjustment and management of automatic implantable cardiac defibrillator: Secondary | ICD-10-CM | POA: Diagnosis not present

## 2022-11-15 DIAGNOSIS — I472 Ventricular tachycardia, unspecified: Secondary | ICD-10-CM | POA: Diagnosis not present

## 2022-11-15 DIAGNOSIS — I4729 Other ventricular tachycardia: Secondary | ICD-10-CM | POA: Diagnosis not present

## 2022-11-22 DIAGNOSIS — I4891 Unspecified atrial fibrillation: Secondary | ICD-10-CM | POA: Diagnosis not present

## 2022-11-22 DIAGNOSIS — N529 Male erectile dysfunction, unspecified: Secondary | ICD-10-CM | POA: Diagnosis not present

## 2022-11-22 DIAGNOSIS — Z951 Presence of aortocoronary bypass graft: Secondary | ICD-10-CM | POA: Diagnosis not present

## 2022-11-22 DIAGNOSIS — I493 Ventricular premature depolarization: Secondary | ICD-10-CM | POA: Diagnosis not present

## 2022-11-22 DIAGNOSIS — I499 Cardiac arrhythmia, unspecified: Secondary | ICD-10-CM | POA: Diagnosis not present

## 2022-11-22 DIAGNOSIS — I10 Essential (primary) hypertension: Secondary | ICD-10-CM | POA: Diagnosis not present

## 2022-11-22 DIAGNOSIS — I255 Ischemic cardiomyopathy: Secondary | ICD-10-CM | POA: Diagnosis not present

## 2022-11-22 DIAGNOSIS — I2510 Atherosclerotic heart disease of native coronary artery without angina pectoris: Secondary | ICD-10-CM | POA: Diagnosis not present

## 2022-11-22 DIAGNOSIS — E119 Type 2 diabetes mellitus without complications: Secondary | ICD-10-CM | POA: Diagnosis not present

## 2022-11-28 DIAGNOSIS — I499 Cardiac arrhythmia, unspecified: Secondary | ICD-10-CM | POA: Diagnosis not present

## 2022-11-28 DIAGNOSIS — I4891 Unspecified atrial fibrillation: Secondary | ICD-10-CM | POA: Diagnosis not present

## 2022-11-28 DIAGNOSIS — I440 Atrioventricular block, first degree: Secondary | ICD-10-CM | POA: Diagnosis not present

## 2022-12-17 DIAGNOSIS — Z08 Encounter for follow-up examination after completed treatment for malignant neoplasm: Secondary | ICD-10-CM | POA: Diagnosis not present

## 2022-12-17 DIAGNOSIS — L570 Actinic keratosis: Secondary | ICD-10-CM | POA: Diagnosis not present

## 2022-12-17 DIAGNOSIS — X32XXXD Exposure to sunlight, subsequent encounter: Secondary | ICD-10-CM | POA: Diagnosis not present

## 2022-12-17 DIAGNOSIS — B078 Other viral warts: Secondary | ICD-10-CM | POA: Diagnosis not present

## 2022-12-17 DIAGNOSIS — L820 Inflamed seborrheic keratosis: Secondary | ICD-10-CM | POA: Diagnosis not present

## 2022-12-17 DIAGNOSIS — Z85828 Personal history of other malignant neoplasm of skin: Secondary | ICD-10-CM | POA: Diagnosis not present

## 2022-12-24 DIAGNOSIS — E1165 Type 2 diabetes mellitus with hyperglycemia: Secondary | ICD-10-CM | POA: Diagnosis not present

## 2022-12-24 DIAGNOSIS — E785 Hyperlipidemia, unspecified: Secondary | ICD-10-CM | POA: Diagnosis not present

## 2022-12-24 DIAGNOSIS — I480 Paroxysmal atrial fibrillation: Secondary | ICD-10-CM | POA: Diagnosis not present

## 2022-12-24 DIAGNOSIS — I10 Essential (primary) hypertension: Secondary | ICD-10-CM | POA: Diagnosis not present

## 2022-12-24 DIAGNOSIS — I2510 Atherosclerotic heart disease of native coronary artery without angina pectoris: Secondary | ICD-10-CM | POA: Diagnosis not present

## 2022-12-24 DIAGNOSIS — R600 Localized edema: Secondary | ICD-10-CM | POA: Diagnosis not present

## 2022-12-24 DIAGNOSIS — G8929 Other chronic pain: Secondary | ICD-10-CM | POA: Diagnosis not present

## 2022-12-24 DIAGNOSIS — Z113 Encounter for screening for infections with a predominantly sexual mode of transmission: Secondary | ICD-10-CM | POA: Diagnosis not present

## 2022-12-31 DIAGNOSIS — M170 Bilateral primary osteoarthritis of knee: Secondary | ICD-10-CM | POA: Diagnosis not present

## 2022-12-31 DIAGNOSIS — I4891 Unspecified atrial fibrillation: Secondary | ICD-10-CM | POA: Diagnosis not present

## 2022-12-31 DIAGNOSIS — I2510 Atherosclerotic heart disease of native coronary artery without angina pectoris: Secondary | ICD-10-CM | POA: Diagnosis not present

## 2022-12-31 DIAGNOSIS — E785 Hyperlipidemia, unspecified: Secondary | ICD-10-CM | POA: Diagnosis not present

## 2022-12-31 DIAGNOSIS — H3530 Unspecified macular degeneration: Secondary | ICD-10-CM | POA: Diagnosis not present

## 2022-12-31 DIAGNOSIS — E1169 Type 2 diabetes mellitus with other specified complication: Secondary | ICD-10-CM | POA: Diagnosis not present

## 2022-12-31 DIAGNOSIS — B0052 Herpesviral keratitis: Secondary | ICD-10-CM | POA: Diagnosis not present

## 2022-12-31 DIAGNOSIS — I509 Heart failure, unspecified: Secondary | ICD-10-CM | POA: Diagnosis not present

## 2022-12-31 DIAGNOSIS — I110 Hypertensive heart disease with heart failure: Secondary | ICD-10-CM | POA: Diagnosis not present

## 2022-12-31 DIAGNOSIS — J329 Chronic sinusitis, unspecified: Secondary | ICD-10-CM | POA: Diagnosis not present

## 2022-12-31 DIAGNOSIS — Z7984 Long term (current) use of oral hypoglycemic drugs: Secondary | ICD-10-CM | POA: Diagnosis not present

## 2023-01-05 DIAGNOSIS — I10 Essential (primary) hypertension: Secondary | ICD-10-CM | POA: Diagnosis not present

## 2023-01-05 DIAGNOSIS — E119 Type 2 diabetes mellitus without complications: Secondary | ICD-10-CM | POA: Diagnosis not present

## 2023-01-05 DIAGNOSIS — I509 Heart failure, unspecified: Secondary | ICD-10-CM | POA: Diagnosis not present

## 2023-01-05 DIAGNOSIS — E785 Hyperlipidemia, unspecified: Secondary | ICD-10-CM | POA: Diagnosis not present

## 2023-01-06 DIAGNOSIS — I10 Essential (primary) hypertension: Secondary | ICD-10-CM | POA: Diagnosis not present

## 2023-01-06 DIAGNOSIS — E559 Vitamin D deficiency, unspecified: Secondary | ICD-10-CM | POA: Diagnosis not present

## 2023-01-06 DIAGNOSIS — E119 Type 2 diabetes mellitus without complications: Secondary | ICD-10-CM | POA: Diagnosis not present

## 2023-01-07 DIAGNOSIS — D582 Other hemoglobinopathies: Secondary | ICD-10-CM | POA: Diagnosis not present

## 2023-01-07 DIAGNOSIS — I110 Hypertensive heart disease with heart failure: Secondary | ICD-10-CM | POA: Diagnosis not present

## 2023-01-07 DIAGNOSIS — E1169 Type 2 diabetes mellitus with other specified complication: Secondary | ICD-10-CM | POA: Diagnosis not present

## 2023-01-07 DIAGNOSIS — I509 Heart failure, unspecified: Secondary | ICD-10-CM | POA: Diagnosis not present

## 2023-01-07 DIAGNOSIS — Z7984 Long term (current) use of oral hypoglycemic drugs: Secondary | ICD-10-CM | POA: Diagnosis not present

## 2023-01-07 DIAGNOSIS — E876 Hypokalemia: Secondary | ICD-10-CM | POA: Diagnosis not present

## 2023-01-07 DIAGNOSIS — E871 Hypo-osmolality and hyponatremia: Secondary | ICD-10-CM | POA: Diagnosis not present

## 2023-01-08 DIAGNOSIS — B0230 Zoster ocular disease, unspecified: Secondary | ICD-10-CM | POA: Diagnosis present

## 2023-01-08 DIAGNOSIS — E871 Hypo-osmolality and hyponatremia: Secondary | ICD-10-CM | POA: Diagnosis present

## 2023-01-08 DIAGNOSIS — E878 Other disorders of electrolyte and fluid balance, not elsewhere classified: Secondary | ICD-10-CM | POA: Diagnosis not present

## 2023-01-08 DIAGNOSIS — I255 Ischemic cardiomyopathy: Secondary | ICD-10-CM | POA: Diagnosis present

## 2023-01-08 DIAGNOSIS — E119 Type 2 diabetes mellitus without complications: Secondary | ICD-10-CM | POA: Diagnosis present

## 2023-01-08 DIAGNOSIS — Z8616 Personal history of COVID-19: Secondary | ICD-10-CM | POA: Diagnosis not present

## 2023-01-08 DIAGNOSIS — R109 Unspecified abdominal pain: Secondary | ICD-10-CM | POA: Diagnosis not present

## 2023-01-08 DIAGNOSIS — E6609 Other obesity due to excess calories: Secondary | ICD-10-CM | POA: Diagnosis present

## 2023-01-08 DIAGNOSIS — I5022 Chronic systolic (congestive) heart failure: Secondary | ICD-10-CM | POA: Diagnosis present

## 2023-01-08 DIAGNOSIS — G9341 Metabolic encephalopathy: Secondary | ICD-10-CM | POA: Diagnosis present

## 2023-01-08 DIAGNOSIS — E873 Alkalosis: Secondary | ICD-10-CM | POA: Diagnosis not present

## 2023-01-08 DIAGNOSIS — R410 Disorientation, unspecified: Secondary | ICD-10-CM | POA: Diagnosis not present

## 2023-01-08 DIAGNOSIS — E861 Hypovolemia: Secondary | ICD-10-CM | POA: Diagnosis present

## 2023-01-08 DIAGNOSIS — Z794 Long term (current) use of insulin: Secondary | ICD-10-CM | POA: Diagnosis not present

## 2023-01-08 DIAGNOSIS — K567 Ileus, unspecified: Secondary | ICD-10-CM | POA: Diagnosis not present

## 2023-01-08 DIAGNOSIS — Z6833 Body mass index (BMI) 33.0-33.9, adult: Secondary | ICD-10-CM | POA: Diagnosis not present

## 2023-01-08 DIAGNOSIS — J449 Chronic obstructive pulmonary disease, unspecified: Secondary | ICD-10-CM | POA: Diagnosis not present

## 2023-01-08 DIAGNOSIS — Z781 Physical restraint status: Secondary | ICD-10-CM | POA: Diagnosis not present

## 2023-01-08 DIAGNOSIS — E876 Hypokalemia: Secondary | ICD-10-CM | POA: Diagnosis present

## 2023-01-08 DIAGNOSIS — E785 Hyperlipidemia, unspecified: Secondary | ICD-10-CM | POA: Diagnosis not present

## 2023-01-08 DIAGNOSIS — R140 Abdominal distension (gaseous): Secondary | ICD-10-CM | POA: Diagnosis not present

## 2023-01-08 DIAGNOSIS — Z9989 Dependence on other enabling machines and devices: Secondary | ICD-10-CM | POA: Diagnosis not present

## 2023-01-08 DIAGNOSIS — J9602 Acute respiratory failure with hypercapnia: Secondary | ICD-10-CM | POA: Diagnosis not present

## 2023-01-08 DIAGNOSIS — R918 Other nonspecific abnormal finding of lung field: Secondary | ICD-10-CM | POA: Diagnosis not present

## 2023-01-08 DIAGNOSIS — R21 Rash and other nonspecific skin eruption: Secondary | ICD-10-CM | POA: Diagnosis not present

## 2023-01-08 DIAGNOSIS — H3530 Unspecified macular degeneration: Secondary | ICD-10-CM | POA: Diagnosis present

## 2023-01-08 DIAGNOSIS — R7889 Finding of other specified substances, not normally found in blood: Secondary | ICD-10-CM | POA: Diagnosis not present

## 2023-01-08 DIAGNOSIS — R0689 Other abnormalities of breathing: Secondary | ICD-10-CM | POA: Diagnosis not present

## 2023-01-08 DIAGNOSIS — R0902 Hypoxemia: Secondary | ICD-10-CM | POA: Diagnosis not present

## 2023-01-08 DIAGNOSIS — H548 Legal blindness, as defined in USA: Secondary | ICD-10-CM | POA: Diagnosis present

## 2023-01-08 DIAGNOSIS — Z8679 Personal history of other diseases of the circulatory system: Secondary | ICD-10-CM | POA: Diagnosis not present

## 2023-01-08 DIAGNOSIS — D72829 Elevated white blood cell count, unspecified: Secondary | ICD-10-CM | POA: Diagnosis present

## 2023-01-08 DIAGNOSIS — I5020 Unspecified systolic (congestive) heart failure: Secondary | ICD-10-CM | POA: Diagnosis not present

## 2023-01-08 DIAGNOSIS — I872 Venous insufficiency (chronic) (peripheral): Secondary | ICD-10-CM | POA: Diagnosis present

## 2023-01-08 DIAGNOSIS — I10 Essential (primary) hypertension: Secondary | ICD-10-CM | POA: Diagnosis not present

## 2023-01-08 DIAGNOSIS — I480 Paroxysmal atrial fibrillation: Secondary | ICD-10-CM | POA: Diagnosis present

## 2023-01-08 DIAGNOSIS — R42 Dizziness and giddiness: Secondary | ICD-10-CM | POA: Diagnosis not present

## 2023-01-08 DIAGNOSIS — J9622 Acute and chronic respiratory failure with hypercapnia: Secondary | ICD-10-CM | POA: Diagnosis present

## 2023-01-08 DIAGNOSIS — I2510 Atherosclerotic heart disease of native coronary artery without angina pectoris: Secondary | ICD-10-CM | POA: Diagnosis not present

## 2023-01-08 DIAGNOSIS — B0239 Other herpes zoster eye disease: Secondary | ICD-10-CM | POA: Diagnosis not present

## 2023-01-08 DIAGNOSIS — I110 Hypertensive heart disease with heart failure: Secondary | ICD-10-CM | POA: Diagnosis not present

## 2023-01-08 DIAGNOSIS — E874 Mixed disorder of acid-base balance: Secondary | ICD-10-CM | POA: Diagnosis present

## 2023-01-08 DIAGNOSIS — R001 Bradycardia, unspecified: Secondary | ICD-10-CM | POA: Diagnosis not present

## 2023-01-08 DIAGNOSIS — J9811 Atelectasis: Secondary | ICD-10-CM | POA: Diagnosis present

## 2023-01-08 DIAGNOSIS — Z951 Presence of aortocoronary bypass graft: Secondary | ICD-10-CM | POA: Diagnosis not present

## 2023-01-09 DIAGNOSIS — J9622 Acute and chronic respiratory failure with hypercapnia: Secondary | ICD-10-CM | POA: Diagnosis present

## 2023-01-09 DIAGNOSIS — J449 Chronic obstructive pulmonary disease, unspecified: Secondary | ICD-10-CM | POA: Diagnosis present

## 2023-01-09 DIAGNOSIS — I872 Venous insufficiency (chronic) (peripheral): Secondary | ICD-10-CM | POA: Diagnosis not present

## 2023-01-09 DIAGNOSIS — E876 Hypokalemia: Secondary | ICD-10-CM | POA: Diagnosis not present

## 2023-01-09 DIAGNOSIS — J9811 Atelectasis: Secondary | ICD-10-CM | POA: Diagnosis present

## 2023-01-09 DIAGNOSIS — H3530 Unspecified macular degeneration: Secondary | ICD-10-CM | POA: Diagnosis present

## 2023-01-09 DIAGNOSIS — E6609 Other obesity due to excess calories: Secondary | ICD-10-CM | POA: Diagnosis present

## 2023-01-09 DIAGNOSIS — I480 Paroxysmal atrial fibrillation: Secondary | ICD-10-CM | POA: Diagnosis not present

## 2023-01-09 DIAGNOSIS — I5020 Unspecified systolic (congestive) heart failure: Secondary | ICD-10-CM | POA: Diagnosis not present

## 2023-01-09 DIAGNOSIS — E873 Alkalosis: Secondary | ICD-10-CM | POA: Diagnosis not present

## 2023-01-09 DIAGNOSIS — E119 Type 2 diabetes mellitus without complications: Secondary | ICD-10-CM | POA: Diagnosis not present

## 2023-01-09 DIAGNOSIS — E878 Other disorders of electrolyte and fluid balance, not elsewhere classified: Secondary | ICD-10-CM | POA: Diagnosis not present

## 2023-01-09 DIAGNOSIS — R140 Abdominal distension (gaseous): Secondary | ICD-10-CM | POA: Diagnosis not present

## 2023-01-09 DIAGNOSIS — Z794 Long term (current) use of insulin: Secondary | ICD-10-CM | POA: Diagnosis not present

## 2023-01-09 DIAGNOSIS — E874 Mixed disorder of acid-base balance: Secondary | ICD-10-CM | POA: Diagnosis present

## 2023-01-09 DIAGNOSIS — E871 Hypo-osmolality and hyponatremia: Secondary | ICD-10-CM | POA: Diagnosis not present

## 2023-01-09 DIAGNOSIS — R0689 Other abnormalities of breathing: Secondary | ICD-10-CM | POA: Diagnosis not present

## 2023-01-09 DIAGNOSIS — B0239 Other herpes zoster eye disease: Secondary | ICD-10-CM | POA: Diagnosis not present

## 2023-01-09 DIAGNOSIS — Z6833 Body mass index (BMI) 33.0-33.9, adult: Secondary | ICD-10-CM | POA: Diagnosis not present

## 2023-01-09 DIAGNOSIS — I5022 Chronic systolic (congestive) heart failure: Secondary | ICD-10-CM | POA: Diagnosis not present

## 2023-01-09 DIAGNOSIS — B0230 Zoster ocular disease, unspecified: Secondary | ICD-10-CM | POA: Diagnosis present

## 2023-01-09 DIAGNOSIS — I110 Hypertensive heart disease with heart failure: Secondary | ICD-10-CM | POA: Diagnosis present

## 2023-01-09 DIAGNOSIS — I255 Ischemic cardiomyopathy: Secondary | ICD-10-CM | POA: Diagnosis present

## 2023-01-09 DIAGNOSIS — Z781 Physical restraint status: Secondary | ICD-10-CM | POA: Diagnosis not present

## 2023-01-09 DIAGNOSIS — I10 Essential (primary) hypertension: Secondary | ICD-10-CM | POA: Diagnosis not present

## 2023-01-09 DIAGNOSIS — G9341 Metabolic encephalopathy: Secondary | ICD-10-CM | POA: Diagnosis present

## 2023-01-09 DIAGNOSIS — Z951 Presence of aortocoronary bypass graft: Secondary | ICD-10-CM | POA: Diagnosis not present

## 2023-01-09 DIAGNOSIS — E861 Hypovolemia: Secondary | ICD-10-CM | POA: Diagnosis present

## 2023-01-09 DIAGNOSIS — J9602 Acute respiratory failure with hypercapnia: Secondary | ICD-10-CM | POA: Diagnosis not present

## 2023-01-09 DIAGNOSIS — E785 Hyperlipidemia, unspecified: Secondary | ICD-10-CM | POA: Diagnosis not present

## 2023-01-09 DIAGNOSIS — R109 Unspecified abdominal pain: Secondary | ICD-10-CM | POA: Diagnosis not present

## 2023-01-09 DIAGNOSIS — I2510 Atherosclerotic heart disease of native coronary artery without angina pectoris: Secondary | ICD-10-CM | POA: Diagnosis not present

## 2023-01-09 DIAGNOSIS — H548 Legal blindness, as defined in USA: Secondary | ICD-10-CM | POA: Diagnosis present

## 2023-01-09 DIAGNOSIS — R21 Rash and other nonspecific skin eruption: Secondary | ICD-10-CM | POA: Diagnosis not present

## 2023-01-09 DIAGNOSIS — Z8616 Personal history of COVID-19: Secondary | ICD-10-CM | POA: Diagnosis not present

## 2023-01-09 DIAGNOSIS — Z8679 Personal history of other diseases of the circulatory system: Secondary | ICD-10-CM | POA: Diagnosis not present

## 2023-01-09 DIAGNOSIS — Z9989 Dependence on other enabling machines and devices: Secondary | ICD-10-CM | POA: Diagnosis not present

## 2023-01-09 DIAGNOSIS — K567 Ileus, unspecified: Secondary | ICD-10-CM | POA: Diagnosis not present

## 2023-01-09 DIAGNOSIS — D72829 Elevated white blood cell count, unspecified: Secondary | ICD-10-CM | POA: Diagnosis present

## 2023-01-25 DIAGNOSIS — B0052 Herpesviral keratitis: Secondary | ICD-10-CM | POA: Diagnosis not present

## 2023-01-25 DIAGNOSIS — Z471 Aftercare following joint replacement surgery: Secondary | ICD-10-CM | POA: Diagnosis not present

## 2023-01-25 DIAGNOSIS — Z7984 Long term (current) use of oral hypoglycemic drugs: Secondary | ICD-10-CM | POA: Diagnosis not present

## 2023-01-25 DIAGNOSIS — E1159 Type 2 diabetes mellitus with other circulatory complications: Secondary | ICD-10-CM | POA: Diagnosis not present

## 2023-01-25 DIAGNOSIS — Z951 Presence of aortocoronary bypass graft: Secondary | ICD-10-CM | POA: Diagnosis not present

## 2023-01-25 DIAGNOSIS — M1712 Unilateral primary osteoarthritis, left knee: Secondary | ICD-10-CM | POA: Diagnosis not present

## 2023-01-25 DIAGNOSIS — Z95810 Presence of automatic (implantable) cardiac defibrillator: Secondary | ICD-10-CM | POA: Diagnosis not present

## 2023-01-25 DIAGNOSIS — E871 Hypo-osmolality and hyponatremia: Secondary | ICD-10-CM | POA: Diagnosis not present

## 2023-01-25 DIAGNOSIS — I4891 Unspecified atrial fibrillation: Secondary | ICD-10-CM | POA: Diagnosis not present

## 2023-01-25 DIAGNOSIS — Z96651 Presence of right artificial knee joint: Secondary | ICD-10-CM | POA: Diagnosis not present

## 2023-01-25 DIAGNOSIS — J984 Other disorders of lung: Secondary | ICD-10-CM | POA: Diagnosis not present

## 2023-01-25 DIAGNOSIS — H3530 Unspecified macular degeneration: Secondary | ICD-10-CM | POA: Diagnosis not present

## 2023-01-25 DIAGNOSIS — I2510 Atherosclerotic heart disease of native coronary artery without angina pectoris: Secondary | ICD-10-CM | POA: Diagnosis not present

## 2023-01-25 DIAGNOSIS — E785 Hyperlipidemia, unspecified: Secondary | ICD-10-CM | POA: Diagnosis not present

## 2023-01-25 DIAGNOSIS — I509 Heart failure, unspecified: Secondary | ICD-10-CM | POA: Diagnosis not present

## 2023-01-25 DIAGNOSIS — I110 Hypertensive heart disease with heart failure: Secondary | ICD-10-CM | POA: Diagnosis not present

## 2023-01-25 DIAGNOSIS — E876 Hypokalemia: Secondary | ICD-10-CM | POA: Diagnosis not present

## 2023-01-27 DIAGNOSIS — I10 Essential (primary) hypertension: Secondary | ICD-10-CM | POA: Diagnosis not present

## 2023-01-27 DIAGNOSIS — E119 Type 2 diabetes mellitus without complications: Secondary | ICD-10-CM | POA: Diagnosis not present

## 2023-01-27 DIAGNOSIS — E785 Hyperlipidemia, unspecified: Secondary | ICD-10-CM | POA: Diagnosis not present

## 2023-02-03 DIAGNOSIS — I2510 Atherosclerotic heart disease of native coronary artery without angina pectoris: Secondary | ICD-10-CM | POA: Diagnosis not present

## 2023-02-03 DIAGNOSIS — I872 Venous insufficiency (chronic) (peripheral): Secondary | ICD-10-CM | POA: Diagnosis not present

## 2023-02-03 DIAGNOSIS — I4891 Unspecified atrial fibrillation: Secondary | ICD-10-CM | POA: Diagnosis not present

## 2023-02-03 DIAGNOSIS — J984 Other disorders of lung: Secondary | ICD-10-CM | POA: Diagnosis not present

## 2023-02-14 DIAGNOSIS — Z4502 Encounter for adjustment and management of automatic implantable cardiac defibrillator: Secondary | ICD-10-CM | POA: Diagnosis not present

## 2023-02-14 DIAGNOSIS — I472 Ventricular tachycardia, unspecified: Secondary | ICD-10-CM | POA: Diagnosis not present

## 2023-02-15 DIAGNOSIS — L84 Corns and callosities: Secondary | ICD-10-CM | POA: Diagnosis not present

## 2023-02-15 DIAGNOSIS — E1151 Type 2 diabetes mellitus with diabetic peripheral angiopathy without gangrene: Secondary | ICD-10-CM | POA: Diagnosis not present

## 2023-02-23 DIAGNOSIS — Z4502 Encounter for adjustment and management of automatic implantable cardiac defibrillator: Secondary | ICD-10-CM | POA: Diagnosis not present

## 2023-02-28 DIAGNOSIS — B0052 Herpesviral keratitis: Secondary | ICD-10-CM | POA: Diagnosis not present

## 2023-02-28 DIAGNOSIS — I110 Hypertensive heart disease with heart failure: Secondary | ICD-10-CM | POA: Diagnosis not present

## 2023-02-28 DIAGNOSIS — I5020 Unspecified systolic (congestive) heart failure: Secondary | ICD-10-CM | POA: Diagnosis not present

## 2023-02-28 DIAGNOSIS — I4891 Unspecified atrial fibrillation: Secondary | ICD-10-CM | POA: Diagnosis not present

## 2023-02-28 DIAGNOSIS — E785 Hyperlipidemia, unspecified: Secondary | ICD-10-CM | POA: Diagnosis not present

## 2023-02-28 DIAGNOSIS — I2510 Atherosclerotic heart disease of native coronary artery without angina pectoris: Secondary | ICD-10-CM | POA: Diagnosis not present

## 2023-02-28 DIAGNOSIS — M170 Bilateral primary osteoarthritis of knee: Secondary | ICD-10-CM | POA: Diagnosis not present

## 2023-02-28 DIAGNOSIS — Z7984 Long term (current) use of oral hypoglycemic drugs: Secondary | ICD-10-CM | POA: Diagnosis not present

## 2023-02-28 DIAGNOSIS — E1169 Type 2 diabetes mellitus with other specified complication: Secondary | ICD-10-CM | POA: Diagnosis not present

## 2023-02-28 DIAGNOSIS — E871 Hypo-osmolality and hyponatremia: Secondary | ICD-10-CM | POA: Diagnosis not present

## 2023-02-28 DIAGNOSIS — H3530 Unspecified macular degeneration: Secondary | ICD-10-CM | POA: Diagnosis not present

## 2023-03-03 DIAGNOSIS — I10 Essential (primary) hypertension: Secondary | ICD-10-CM | POA: Diagnosis not present

## 2023-03-03 DIAGNOSIS — J449 Chronic obstructive pulmonary disease, unspecified: Secondary | ICD-10-CM | POA: Diagnosis not present

## 2023-03-17 DIAGNOSIS — I480 Paroxysmal atrial fibrillation: Secondary | ICD-10-CM | POA: Diagnosis not present

## 2023-03-17 DIAGNOSIS — Z4502 Encounter for adjustment and management of automatic implantable cardiac defibrillator: Secondary | ICD-10-CM | POA: Diagnosis not present

## 2023-03-17 DIAGNOSIS — Z95810 Presence of automatic (implantable) cardiac defibrillator: Secondary | ICD-10-CM | POA: Diagnosis not present

## 2023-03-17 DIAGNOSIS — I5020 Unspecified systolic (congestive) heart failure: Secondary | ICD-10-CM | POA: Diagnosis not present

## 2023-03-17 DIAGNOSIS — I440 Atrioventricular block, first degree: Secondary | ICD-10-CM | POA: Diagnosis not present

## 2023-03-17 DIAGNOSIS — I499 Cardiac arrhythmia, unspecified: Secondary | ICD-10-CM | POA: Diagnosis not present

## 2023-03-22 DIAGNOSIS — I2510 Atherosclerotic heart disease of native coronary artery without angina pectoris: Secondary | ICD-10-CM | POA: Diagnosis not present

## 2023-03-22 DIAGNOSIS — B0052 Herpesviral keratitis: Secondary | ICD-10-CM | POA: Diagnosis not present

## 2023-03-22 DIAGNOSIS — Z95810 Presence of automatic (implantable) cardiac defibrillator: Secondary | ICD-10-CM | POA: Diagnosis not present

## 2023-03-22 DIAGNOSIS — I110 Hypertensive heart disease with heart failure: Secondary | ICD-10-CM | POA: Diagnosis not present

## 2023-03-22 DIAGNOSIS — I4891 Unspecified atrial fibrillation: Secondary | ICD-10-CM | POA: Diagnosis not present

## 2023-03-22 DIAGNOSIS — E785 Hyperlipidemia, unspecified: Secondary | ICD-10-CM | POA: Diagnosis not present

## 2023-03-22 DIAGNOSIS — J984 Other disorders of lung: Secondary | ICD-10-CM | POA: Diagnosis not present

## 2023-03-22 DIAGNOSIS — H3530 Unspecified macular degeneration: Secondary | ICD-10-CM | POA: Diagnosis not present

## 2023-03-22 DIAGNOSIS — M170 Bilateral primary osteoarthritis of knee: Secondary | ICD-10-CM | POA: Diagnosis not present

## 2023-03-22 DIAGNOSIS — J988 Other specified respiratory disorders: Secondary | ICD-10-CM | POA: Diagnosis not present

## 2023-03-22 DIAGNOSIS — E1169 Type 2 diabetes mellitus with other specified complication: Secondary | ICD-10-CM | POA: Diagnosis not present

## 2023-03-22 DIAGNOSIS — Z951 Presence of aortocoronary bypass graft: Secondary | ICD-10-CM | POA: Diagnosis not present

## 2023-03-22 DIAGNOSIS — Z7984 Long term (current) use of oral hypoglycemic drugs: Secondary | ICD-10-CM | POA: Diagnosis not present

## 2023-03-22 DIAGNOSIS — Z96651 Presence of right artificial knee joint: Secondary | ICD-10-CM | POA: Diagnosis not present

## 2023-03-22 DIAGNOSIS — I5020 Unspecified systolic (congestive) heart failure: Secondary | ICD-10-CM | POA: Diagnosis not present

## 2023-03-23 DIAGNOSIS — I440 Atrioventricular block, first degree: Secondary | ICD-10-CM | POA: Diagnosis not present

## 2023-03-23 DIAGNOSIS — N390 Urinary tract infection, site not specified: Secondary | ICD-10-CM | POA: Diagnosis not present

## 2023-03-23 DIAGNOSIS — I499 Cardiac arrhythmia, unspecified: Secondary | ICD-10-CM | POA: Diagnosis not present

## 2023-03-23 DIAGNOSIS — J160 Chlamydial pneumonia: Secondary | ICD-10-CM | POA: Diagnosis not present

## 2023-03-23 DIAGNOSIS — J1561 Pneumonia due to Acinetobacter baumannii: Secondary | ICD-10-CM | POA: Diagnosis not present

## 2023-04-06 DIAGNOSIS — H43813 Vitreous degeneration, bilateral: Secondary | ICD-10-CM | POA: Diagnosis not present

## 2023-04-06 DIAGNOSIS — H353132 Nonexudative age-related macular degeneration, bilateral, intermediate dry stage: Secondary | ICD-10-CM | POA: Diagnosis not present

## 2023-04-06 DIAGNOSIS — H353233 Exudative age-related macular degeneration, bilateral, with inactive scar: Secondary | ICD-10-CM | POA: Diagnosis not present

## 2023-04-14 DIAGNOSIS — I10 Essential (primary) hypertension: Secondary | ICD-10-CM | POA: Diagnosis not present

## 2023-04-22 DIAGNOSIS — Z7984 Long term (current) use of oral hypoglycemic drugs: Secondary | ICD-10-CM | POA: Diagnosis not present

## 2023-04-22 DIAGNOSIS — E1169 Type 2 diabetes mellitus with other specified complication: Secondary | ICD-10-CM | POA: Diagnosis not present

## 2023-04-22 DIAGNOSIS — E785 Hyperlipidemia, unspecified: Secondary | ICD-10-CM | POA: Diagnosis not present

## 2023-04-22 DIAGNOSIS — I5020 Unspecified systolic (congestive) heart failure: Secondary | ICD-10-CM | POA: Diagnosis not present

## 2023-04-25 DIAGNOSIS — E785 Hyperlipidemia, unspecified: Secondary | ICD-10-CM | POA: Diagnosis not present

## 2023-04-25 DIAGNOSIS — Z7984 Long term (current) use of oral hypoglycemic drugs: Secondary | ICD-10-CM | POA: Diagnosis not present

## 2023-04-25 DIAGNOSIS — E1169 Type 2 diabetes mellitus with other specified complication: Secondary | ICD-10-CM | POA: Diagnosis not present

## 2023-04-25 DIAGNOSIS — J984 Other disorders of lung: Secondary | ICD-10-CM | POA: Diagnosis not present

## 2023-04-25 DIAGNOSIS — Z1331 Encounter for screening for depression: Secondary | ICD-10-CM | POA: Diagnosis not present

## 2023-04-25 DIAGNOSIS — Z95810 Presence of automatic (implantable) cardiac defibrillator: Secondary | ICD-10-CM | POA: Diagnosis not present

## 2023-04-25 DIAGNOSIS — I2510 Atherosclerotic heart disease of native coronary artery without angina pectoris: Secondary | ICD-10-CM | POA: Diagnosis not present

## 2023-04-25 DIAGNOSIS — I5020 Unspecified systolic (congestive) heart failure: Secondary | ICD-10-CM | POA: Diagnosis not present

## 2023-04-25 DIAGNOSIS — B0052 Herpesviral keratitis: Secondary | ICD-10-CM | POA: Diagnosis not present

## 2023-04-25 DIAGNOSIS — I110 Hypertensive heart disease with heart failure: Secondary | ICD-10-CM | POA: Diagnosis not present

## 2023-04-25 DIAGNOSIS — I878 Other specified disorders of veins: Secondary | ICD-10-CM | POA: Diagnosis not present

## 2023-04-25 DIAGNOSIS — M170 Bilateral primary osteoarthritis of knee: Secondary | ICD-10-CM | POA: Diagnosis not present

## 2023-04-26 DIAGNOSIS — I10 Essential (primary) hypertension: Secondary | ICD-10-CM | POA: Diagnosis not present

## 2023-04-26 DIAGNOSIS — N390 Urinary tract infection, site not specified: Secondary | ICD-10-CM | POA: Diagnosis not present

## 2023-04-26 DIAGNOSIS — E119 Type 2 diabetes mellitus without complications: Secondary | ICD-10-CM | POA: Diagnosis not present

## 2023-04-27 DIAGNOSIS — J449 Chronic obstructive pulmonary disease, unspecified: Secondary | ICD-10-CM | POA: Diagnosis not present

## 2023-04-27 DIAGNOSIS — M6281 Muscle weakness (generalized): Secondary | ICD-10-CM | POA: Diagnosis not present

## 2023-04-29 DIAGNOSIS — E782 Mixed hyperlipidemia: Secondary | ICD-10-CM | POA: Diagnosis not present

## 2023-04-29 DIAGNOSIS — I2510 Atherosclerotic heart disease of native coronary artery without angina pectoris: Secondary | ICD-10-CM | POA: Diagnosis not present

## 2023-04-29 DIAGNOSIS — Z951 Presence of aortocoronary bypass graft: Secondary | ICD-10-CM | POA: Diagnosis not present

## 2023-04-29 DIAGNOSIS — I5020 Unspecified systolic (congestive) heart failure: Secondary | ICD-10-CM | POA: Diagnosis not present

## 2023-05-04 DIAGNOSIS — J984 Other disorders of lung: Secondary | ICD-10-CM | POA: Diagnosis not present

## 2023-05-04 DIAGNOSIS — I5020 Unspecified systolic (congestive) heart failure: Secondary | ICD-10-CM | POA: Diagnosis not present

## 2023-05-04 DIAGNOSIS — I4891 Unspecified atrial fibrillation: Secondary | ICD-10-CM | POA: Diagnosis not present

## 2023-05-04 DIAGNOSIS — E785 Hyperlipidemia, unspecified: Secondary | ICD-10-CM | POA: Diagnosis not present

## 2023-05-04 DIAGNOSIS — Z7901 Long term (current) use of anticoagulants: Secondary | ICD-10-CM | POA: Diagnosis not present

## 2023-05-04 DIAGNOSIS — I2510 Atherosclerotic heart disease of native coronary artery without angina pectoris: Secondary | ICD-10-CM | POA: Diagnosis not present

## 2023-05-04 DIAGNOSIS — E1151 Type 2 diabetes mellitus with diabetic peripheral angiopathy without gangrene: Secondary | ICD-10-CM | POA: Diagnosis not present

## 2023-05-04 DIAGNOSIS — B0052 Herpesviral keratitis: Secondary | ICD-10-CM | POA: Diagnosis not present

## 2023-05-04 DIAGNOSIS — L84 Corns and callosities: Secondary | ICD-10-CM | POA: Diagnosis not present

## 2023-05-04 DIAGNOSIS — Z95810 Presence of automatic (implantable) cardiac defibrillator: Secondary | ICD-10-CM | POA: Diagnosis not present

## 2023-05-04 DIAGNOSIS — Z7984 Long term (current) use of oral hypoglycemic drugs: Secondary | ICD-10-CM | POA: Diagnosis not present

## 2023-05-04 DIAGNOSIS — E1169 Type 2 diabetes mellitus with other specified complication: Secondary | ICD-10-CM | POA: Diagnosis not present

## 2023-05-06 DIAGNOSIS — H3530 Unspecified macular degeneration: Secondary | ICD-10-CM | POA: Diagnosis not present

## 2023-05-06 DIAGNOSIS — I2510 Atherosclerotic heart disease of native coronary artery without angina pectoris: Secondary | ICD-10-CM | POA: Diagnosis not present

## 2023-05-06 DIAGNOSIS — E1169 Type 2 diabetes mellitus with other specified complication: Secondary | ICD-10-CM | POA: Diagnosis not present

## 2023-05-06 DIAGNOSIS — I110 Hypertensive heart disease with heart failure: Secondary | ICD-10-CM | POA: Diagnosis not present

## 2023-05-06 DIAGNOSIS — I509 Heart failure, unspecified: Secondary | ICD-10-CM | POA: Diagnosis not present

## 2023-05-06 DIAGNOSIS — Z951 Presence of aortocoronary bypass graft: Secondary | ICD-10-CM | POA: Diagnosis not present

## 2023-05-06 DIAGNOSIS — I878 Other specified disorders of veins: Secondary | ICD-10-CM | POA: Diagnosis not present

## 2023-05-06 DIAGNOSIS — E785 Hyperlipidemia, unspecified: Secondary | ICD-10-CM | POA: Diagnosis not present

## 2023-05-06 DIAGNOSIS — B0052 Herpesviral keratitis: Secondary | ICD-10-CM | POA: Diagnosis not present

## 2023-05-06 DIAGNOSIS — Z7984 Long term (current) use of oral hypoglycemic drugs: Secondary | ICD-10-CM | POA: Diagnosis not present

## 2023-05-06 DIAGNOSIS — I4891 Unspecified atrial fibrillation: Secondary | ICD-10-CM | POA: Diagnosis not present

## 2023-05-06 DIAGNOSIS — J984 Other disorders of lung: Secondary | ICD-10-CM | POA: Diagnosis not present

## 2023-05-06 DIAGNOSIS — M170 Bilateral primary osteoarthritis of knee: Secondary | ICD-10-CM | POA: Diagnosis not present

## 2023-05-06 DIAGNOSIS — Z95810 Presence of automatic (implantable) cardiac defibrillator: Secondary | ICD-10-CM | POA: Diagnosis not present

## 2023-05-09 DIAGNOSIS — I10 Essential (primary) hypertension: Secondary | ICD-10-CM | POA: Diagnosis not present

## 2023-05-11 DIAGNOSIS — E785 Hyperlipidemia, unspecified: Secondary | ICD-10-CM | POA: Diagnosis not present

## 2023-05-11 DIAGNOSIS — G9349 Other encephalopathy: Secondary | ICD-10-CM | POA: Diagnosis not present

## 2023-05-11 DIAGNOSIS — E1169 Type 2 diabetes mellitus with other specified complication: Secondary | ICD-10-CM | POA: Diagnosis not present

## 2023-05-11 DIAGNOSIS — Z95810 Presence of automatic (implantable) cardiac defibrillator: Secondary | ICD-10-CM | POA: Diagnosis not present

## 2023-05-11 DIAGNOSIS — M170 Bilateral primary osteoarthritis of knee: Secondary | ICD-10-CM | POA: Diagnosis not present

## 2023-05-11 DIAGNOSIS — B0052 Herpesviral keratitis: Secondary | ICD-10-CM | POA: Diagnosis not present

## 2023-05-11 DIAGNOSIS — I110 Hypertensive heart disease with heart failure: Secondary | ICD-10-CM | POA: Diagnosis not present

## 2023-05-11 DIAGNOSIS — I2510 Atherosclerotic heart disease of native coronary artery without angina pectoris: Secondary | ICD-10-CM | POA: Diagnosis not present

## 2023-05-16 DIAGNOSIS — I5020 Unspecified systolic (congestive) heart failure: Secondary | ICD-10-CM | POA: Diagnosis not present

## 2023-05-18 DIAGNOSIS — B0052 Herpesviral keratitis: Secondary | ICD-10-CM | POA: Diagnosis not present

## 2023-05-18 DIAGNOSIS — I2510 Atherosclerotic heart disease of native coronary artery without angina pectoris: Secondary | ICD-10-CM | POA: Diagnosis not present

## 2023-05-18 DIAGNOSIS — E60 Dietary zinc deficiency: Secondary | ICD-10-CM | POA: Diagnosis not present

## 2023-05-18 DIAGNOSIS — H53413 Scotoma involving central area, bilateral: Secondary | ICD-10-CM | POA: Diagnosis not present

## 2023-05-25 DIAGNOSIS — I440 Atrioventricular block, first degree: Secondary | ICD-10-CM | POA: Diagnosis not present

## 2023-05-25 DIAGNOSIS — I480 Paroxysmal atrial fibrillation: Secondary | ICD-10-CM | POA: Diagnosis not present

## 2023-05-25 DIAGNOSIS — I499 Cardiac arrhythmia, unspecified: Secondary | ICD-10-CM | POA: Diagnosis not present

## 2023-05-25 DIAGNOSIS — I444 Left anterior fascicular block: Secondary | ICD-10-CM | POA: Diagnosis not present

## 2023-05-26 DIAGNOSIS — I499 Cardiac arrhythmia, unspecified: Secondary | ICD-10-CM | POA: Diagnosis not present

## 2023-05-26 DIAGNOSIS — I440 Atrioventricular block, first degree: Secondary | ICD-10-CM | POA: Diagnosis not present

## 2023-05-26 DIAGNOSIS — I444 Left anterior fascicular block: Secondary | ICD-10-CM | POA: Diagnosis not present

## 2023-06-30 DIAGNOSIS — I4891 Unspecified atrial fibrillation: Secondary | ICD-10-CM | POA: Diagnosis not present

## 2023-06-30 DIAGNOSIS — I2510 Atherosclerotic heart disease of native coronary artery without angina pectoris: Secondary | ICD-10-CM | POA: Diagnosis not present

## 2023-06-30 DIAGNOSIS — E1169 Type 2 diabetes mellitus with other specified complication: Secondary | ICD-10-CM | POA: Diagnosis not present

## 2023-07-27 DIAGNOSIS — U071 COVID-19: Secondary | ICD-10-CM | POA: Diagnosis not present

## 2023-08-01 DIAGNOSIS — U071 COVID-19: Secondary | ICD-10-CM | POA: Diagnosis not present

## 2023-08-04 DIAGNOSIS — E785 Hyperlipidemia, unspecified: Secondary | ICD-10-CM | POA: Diagnosis not present

## 2023-08-04 DIAGNOSIS — Z79899 Other long term (current) drug therapy: Secondary | ICD-10-CM | POA: Diagnosis not present

## 2023-08-04 DIAGNOSIS — E559 Vitamin D deficiency, unspecified: Secondary | ICD-10-CM | POA: Diagnosis not present

## 2023-08-04 DIAGNOSIS — E119 Type 2 diabetes mellitus without complications: Secondary | ICD-10-CM | POA: Diagnosis not present

## 2023-08-05 DIAGNOSIS — U071 COVID-19: Secondary | ICD-10-CM | POA: Diagnosis not present

## 2023-08-11 DIAGNOSIS — D649 Anemia, unspecified: Secondary | ICD-10-CM | POA: Diagnosis not present

## 2023-08-15 DIAGNOSIS — I472 Ventricular tachycardia, unspecified: Secondary | ICD-10-CM | POA: Diagnosis not present

## 2023-08-18 DIAGNOSIS — I4891 Unspecified atrial fibrillation: Secondary | ICD-10-CM | POA: Diagnosis not present

## 2023-08-19 DIAGNOSIS — Z4502 Encounter for adjustment and management of automatic implantable cardiac defibrillator: Secondary | ICD-10-CM | POA: Diagnosis not present

## 2023-08-22 DIAGNOSIS — I10 Essential (primary) hypertension: Secondary | ICD-10-CM | POA: Diagnosis not present

## 2023-08-29 DIAGNOSIS — L03116 Cellulitis of left lower limb: Secondary | ICD-10-CM | POA: Diagnosis not present

## 2023-08-29 DIAGNOSIS — L03115 Cellulitis of right lower limb: Secondary | ICD-10-CM | POA: Diagnosis not present

## 2023-08-30 DIAGNOSIS — I2510 Atherosclerotic heart disease of native coronary artery without angina pectoris: Secondary | ICD-10-CM | POA: Diagnosis not present

## 2023-08-30 DIAGNOSIS — Z951 Presence of aortocoronary bypass graft: Secondary | ICD-10-CM | POA: Diagnosis not present

## 2023-08-30 DIAGNOSIS — E782 Mixed hyperlipidemia: Secondary | ICD-10-CM | POA: Diagnosis not present

## 2023-08-30 DIAGNOSIS — I5022 Chronic systolic (congestive) heart failure: Secondary | ICD-10-CM | POA: Diagnosis not present

## 2023-08-30 DIAGNOSIS — Z719 Counseling, unspecified: Secondary | ICD-10-CM | POA: Diagnosis not present

## 2023-08-30 DIAGNOSIS — I10 Essential (primary) hypertension: Secondary | ICD-10-CM | POA: Diagnosis not present

## 2023-08-30 DIAGNOSIS — I110 Hypertensive heart disease with heart failure: Secondary | ICD-10-CM | POA: Diagnosis not present

## 2023-08-30 DIAGNOSIS — I5020 Unspecified systolic (congestive) heart failure: Secondary | ICD-10-CM | POA: Diagnosis not present

## 2023-08-30 DIAGNOSIS — R000 Tachycardia, unspecified: Secondary | ICD-10-CM | POA: Diagnosis not present

## 2023-08-31 DIAGNOSIS — I872 Venous insufficiency (chronic) (peripheral): Secondary | ICD-10-CM | POA: Diagnosis not present

## 2023-08-31 DIAGNOSIS — D225 Melanocytic nevi of trunk: Secondary | ICD-10-CM | POA: Diagnosis not present

## 2023-08-31 DIAGNOSIS — L570 Actinic keratosis: Secondary | ICD-10-CM | POA: Diagnosis not present

## 2023-08-31 DIAGNOSIS — X32XXXD Exposure to sunlight, subsequent encounter: Secondary | ICD-10-CM | POA: Diagnosis not present

## 2023-09-02 DIAGNOSIS — E1151 Type 2 diabetes mellitus with diabetic peripheral angiopathy without gangrene: Secondary | ICD-10-CM | POA: Diagnosis not present

## 2023-09-02 DIAGNOSIS — L84 Corns and callosities: Secondary | ICD-10-CM | POA: Diagnosis not present

## 2023-09-02 DIAGNOSIS — Z7984 Long term (current) use of oral hypoglycemic drugs: Secondary | ICD-10-CM | POA: Diagnosis not present

## 2023-09-06 DIAGNOSIS — I10 Essential (primary) hypertension: Secondary | ICD-10-CM | POA: Diagnosis not present

## 2023-09-27 DIAGNOSIS — X32XXXD Exposure to sunlight, subsequent encounter: Secondary | ICD-10-CM | POA: Diagnosis not present

## 2023-09-27 DIAGNOSIS — L570 Actinic keratosis: Secondary | ICD-10-CM | POA: Diagnosis not present

## 2023-09-27 DIAGNOSIS — D045 Carcinoma in situ of skin of trunk: Secondary | ICD-10-CM | POA: Diagnosis not present

## 2023-09-27 DIAGNOSIS — D044 Carcinoma in situ of skin of scalp and neck: Secondary | ICD-10-CM | POA: Diagnosis not present

## 2023-09-27 DIAGNOSIS — I872 Venous insufficiency (chronic) (peripheral): Secondary | ICD-10-CM | POA: Diagnosis not present

## 2023-10-10 DIAGNOSIS — L03115 Cellulitis of right lower limb: Secondary | ICD-10-CM | POA: Diagnosis not present

## 2023-10-10 DIAGNOSIS — L03116 Cellulitis of left lower limb: Secondary | ICD-10-CM | POA: Diagnosis not present

## 2023-10-11 DIAGNOSIS — H353132 Nonexudative age-related macular degeneration, bilateral, intermediate dry stage: Secondary | ICD-10-CM | POA: Diagnosis not present

## 2023-10-11 DIAGNOSIS — H353233 Exudative age-related macular degeneration, bilateral, with inactive scar: Secondary | ICD-10-CM | POA: Diagnosis not present

## 2023-10-11 DIAGNOSIS — H43813 Vitreous degeneration, bilateral: Secondary | ICD-10-CM | POA: Diagnosis not present

## 2023-11-08 DIAGNOSIS — I872 Venous insufficiency (chronic) (peripheral): Secondary | ICD-10-CM | POA: Diagnosis not present

## 2023-11-08 DIAGNOSIS — Z08 Encounter for follow-up examination after completed treatment for malignant neoplasm: Secondary | ICD-10-CM | POA: Diagnosis not present

## 2023-11-08 DIAGNOSIS — B355 Tinea imbricata: Secondary | ICD-10-CM | POA: Diagnosis not present

## 2023-11-08 DIAGNOSIS — Z85828 Personal history of other malignant neoplasm of skin: Secondary | ICD-10-CM | POA: Diagnosis not present

## 2023-11-14 DIAGNOSIS — I5020 Unspecified systolic (congestive) heart failure: Secondary | ICD-10-CM | POA: Diagnosis not present

## 2023-11-15 DIAGNOSIS — I2510 Atherosclerotic heart disease of native coronary artery without angina pectoris: Secondary | ICD-10-CM | POA: Diagnosis not present

## 2023-11-15 DIAGNOSIS — I509 Heart failure, unspecified: Secondary | ICD-10-CM | POA: Diagnosis not present

## 2023-11-15 DIAGNOSIS — I4891 Unspecified atrial fibrillation: Secondary | ICD-10-CM | POA: Diagnosis not present

## 2023-11-24 DIAGNOSIS — L84 Corns and callosities: Secondary | ICD-10-CM | POA: Diagnosis not present

## 2023-11-24 DIAGNOSIS — E1151 Type 2 diabetes mellitus with diabetic peripheral angiopathy without gangrene: Secondary | ICD-10-CM | POA: Diagnosis not present

## 2023-11-24 DIAGNOSIS — L602 Onychogryphosis: Secondary | ICD-10-CM | POA: Diagnosis not present

## 2023-11-24 DIAGNOSIS — L603 Nail dystrophy: Secondary | ICD-10-CM | POA: Diagnosis not present

## 2023-11-24 DIAGNOSIS — Z7984 Long term (current) use of oral hypoglycemic drugs: Secondary | ICD-10-CM | POA: Diagnosis not present

## 2023-12-12 DIAGNOSIS — R0602 Shortness of breath: Secondary | ICD-10-CM | POA: Diagnosis not present

## 2023-12-12 DIAGNOSIS — R109 Unspecified abdominal pain: Secondary | ICD-10-CM | POA: Diagnosis not present

## 2023-12-12 DIAGNOSIS — K5900 Constipation, unspecified: Secondary | ICD-10-CM | POA: Diagnosis not present

## 2023-12-12 DIAGNOSIS — Z7901 Long term (current) use of anticoagulants: Secondary | ICD-10-CM | POA: Diagnosis not present

## 2023-12-12 DIAGNOSIS — R140 Abdominal distension (gaseous): Secondary | ICD-10-CM | POA: Diagnosis not present

## 2023-12-14 DIAGNOSIS — R109 Unspecified abdominal pain: Secondary | ICD-10-CM | POA: Diagnosis not present

## 2023-12-14 DIAGNOSIS — R140 Abdominal distension (gaseous): Secondary | ICD-10-CM | POA: Diagnosis not present

## 2023-12-14 DIAGNOSIS — K567 Ileus, unspecified: Secondary | ICD-10-CM | POA: Diagnosis not present

## 2023-12-19 DIAGNOSIS — I5020 Unspecified systolic (congestive) heart failure: Secondary | ICD-10-CM | POA: Diagnosis not present

## 2023-12-19 DIAGNOSIS — Z95810 Presence of automatic (implantable) cardiac defibrillator: Secondary | ICD-10-CM | POA: Diagnosis not present

## 2023-12-20 DIAGNOSIS — Z08 Encounter for follow-up examination after completed treatment for malignant neoplasm: Secondary | ICD-10-CM | POA: Diagnosis not present

## 2023-12-20 DIAGNOSIS — D045 Carcinoma in situ of skin of trunk: Secondary | ICD-10-CM | POA: Diagnosis not present

## 2023-12-20 DIAGNOSIS — Z85828 Personal history of other malignant neoplasm of skin: Secondary | ICD-10-CM | POA: Diagnosis not present

## 2023-12-20 DIAGNOSIS — I872 Venous insufficiency (chronic) (peripheral): Secondary | ICD-10-CM | POA: Diagnosis not present

## 2023-12-21 DIAGNOSIS — I440 Atrioventricular block, first degree: Secondary | ICD-10-CM | POA: Diagnosis not present

## 2023-12-21 DIAGNOSIS — I499 Cardiac arrhythmia, unspecified: Secondary | ICD-10-CM | POA: Diagnosis not present

## 2023-12-22 DIAGNOSIS — E1169 Type 2 diabetes mellitus with other specified complication: Secondary | ICD-10-CM | POA: Diagnosis not present

## 2023-12-22 DIAGNOSIS — I2510 Atherosclerotic heart disease of native coronary artery without angina pectoris: Secondary | ICD-10-CM | POA: Diagnosis not present

## 2023-12-22 DIAGNOSIS — I4891 Unspecified atrial fibrillation: Secondary | ICD-10-CM | POA: Diagnosis not present

## 2023-12-22 DIAGNOSIS — I509 Heart failure, unspecified: Secondary | ICD-10-CM | POA: Diagnosis not present

## 2023-12-26 DIAGNOSIS — E785 Hyperlipidemia, unspecified: Secondary | ICD-10-CM | POA: Diagnosis not present

## 2023-12-26 DIAGNOSIS — E119 Type 2 diabetes mellitus without complications: Secondary | ICD-10-CM | POA: Diagnosis not present

## 2024-01-11 DIAGNOSIS — H04123 Dry eye syndrome of bilateral lacrimal glands: Secondary | ICD-10-CM | POA: Diagnosis not present

## 2024-01-11 DIAGNOSIS — H353232 Exudative age-related macular degeneration, bilateral, with inactive choroidal neovascularization: Secondary | ICD-10-CM | POA: Diagnosis not present

## 2024-01-17 DIAGNOSIS — R058 Other specified cough: Secondary | ICD-10-CM | POA: Diagnosis not present

## 2024-01-17 DIAGNOSIS — R062 Wheezing: Secondary | ICD-10-CM | POA: Diagnosis not present

## 2024-01-18 DIAGNOSIS — I4891 Unspecified atrial fibrillation: Secondary | ICD-10-CM | POA: Diagnosis not present

## 2024-01-18 DIAGNOSIS — R058 Other specified cough: Secondary | ICD-10-CM | POA: Diagnosis not present

## 2024-01-18 DIAGNOSIS — I10 Essential (primary) hypertension: Secondary | ICD-10-CM | POA: Diagnosis not present

## 2024-01-18 DIAGNOSIS — J9600 Acute respiratory failure, unspecified whether with hypoxia or hypercapnia: Secondary | ICD-10-CM | POA: Diagnosis not present

## 2024-01-18 DIAGNOSIS — E1169 Type 2 diabetes mellitus with other specified complication: Secondary | ICD-10-CM | POA: Diagnosis not present

## 2024-01-18 DIAGNOSIS — I2510 Atherosclerotic heart disease of native coronary artery without angina pectoris: Secondary | ICD-10-CM | POA: Diagnosis not present

## 2024-01-18 DIAGNOSIS — R059 Cough, unspecified: Secondary | ICD-10-CM | POA: Diagnosis not present

## 2024-01-19 DIAGNOSIS — R062 Wheezing: Secondary | ICD-10-CM | POA: Diagnosis not present

## 2024-01-19 DIAGNOSIS — R058 Other specified cough: Secondary | ICD-10-CM | POA: Diagnosis not present

## 2024-01-31 DIAGNOSIS — L570 Actinic keratosis: Secondary | ICD-10-CM | POA: Diagnosis not present

## 2024-01-31 DIAGNOSIS — Z85828 Personal history of other malignant neoplasm of skin: Secondary | ICD-10-CM | POA: Diagnosis not present

## 2024-01-31 DIAGNOSIS — L928 Other granulomatous disorders of the skin and subcutaneous tissue: Secondary | ICD-10-CM | POA: Diagnosis not present

## 2024-01-31 DIAGNOSIS — X32XXXD Exposure to sunlight, subsequent encounter: Secondary | ICD-10-CM | POA: Diagnosis not present

## 2024-01-31 DIAGNOSIS — Z08 Encounter for follow-up examination after completed treatment for malignant neoplasm: Secondary | ICD-10-CM | POA: Diagnosis not present

## 2024-01-31 DIAGNOSIS — I872 Venous insufficiency (chronic) (peripheral): Secondary | ICD-10-CM | POA: Diagnosis not present

## 2024-02-13 DIAGNOSIS — I472 Ventricular tachycardia, unspecified: Secondary | ICD-10-CM | POA: Diagnosis not present

## 2024-02-13 DIAGNOSIS — I2510 Atherosclerotic heart disease of native coronary artery without angina pectoris: Secondary | ICD-10-CM | POA: Diagnosis not present

## 2024-02-13 DIAGNOSIS — E1169 Type 2 diabetes mellitus with other specified complication: Secondary | ICD-10-CM | POA: Diagnosis not present

## 2024-02-13 DIAGNOSIS — I4891 Unspecified atrial fibrillation: Secondary | ICD-10-CM | POA: Diagnosis not present

## 2024-02-14 DIAGNOSIS — I472 Ventricular tachycardia, unspecified: Secondary | ICD-10-CM | POA: Diagnosis not present

## 2024-02-14 DIAGNOSIS — Z4502 Encounter for adjustment and management of automatic implantable cardiac defibrillator: Secondary | ICD-10-CM | POA: Diagnosis not present

## 2024-02-16 DIAGNOSIS — I5022 Chronic systolic (congestive) heart failure: Secondary | ICD-10-CM | POA: Diagnosis not present

## 2024-02-16 DIAGNOSIS — E119 Type 2 diabetes mellitus without complications: Secondary | ICD-10-CM | POA: Diagnosis not present

## 2024-02-16 DIAGNOSIS — J449 Chronic obstructive pulmonary disease, unspecified: Secondary | ICD-10-CM | POA: Diagnosis not present

## 2024-02-16 DIAGNOSIS — I5020 Unspecified systolic (congestive) heart failure: Secondary | ICD-10-CM | POA: Diagnosis not present

## 2024-02-16 DIAGNOSIS — B0052 Herpesviral keratitis: Secondary | ICD-10-CM | POA: Diagnosis not present

## 2024-02-16 DIAGNOSIS — I110 Hypertensive heart disease with heart failure: Secondary | ICD-10-CM | POA: Diagnosis not present

## 2024-02-16 DIAGNOSIS — L97812 Non-pressure chronic ulcer of other part of right lower leg with fat layer exposed: Secondary | ICD-10-CM | POA: Diagnosis not present

## 2024-02-16 DIAGNOSIS — I87311 Chronic venous hypertension (idiopathic) with ulcer of right lower extremity: Secondary | ICD-10-CM | POA: Diagnosis not present

## 2024-02-16 DIAGNOSIS — Z7984 Long term (current) use of oral hypoglycemic drugs: Secondary | ICD-10-CM | POA: Diagnosis not present

## 2024-02-16 DIAGNOSIS — I878 Other specified disorders of veins: Secondary | ICD-10-CM | POA: Diagnosis not present

## 2024-02-16 DIAGNOSIS — G9340 Encephalopathy, unspecified: Secondary | ICD-10-CM | POA: Diagnosis not present

## 2024-02-16 DIAGNOSIS — I2510 Atherosclerotic heart disease of native coronary artery without angina pectoris: Secondary | ICD-10-CM | POA: Diagnosis not present

## 2024-02-16 DIAGNOSIS — H04123 Dry eye syndrome of bilateral lacrimal glands: Secondary | ICD-10-CM | POA: Diagnosis not present

## 2024-02-16 DIAGNOSIS — E785 Hyperlipidemia, unspecified: Secondary | ICD-10-CM | POA: Diagnosis not present

## 2024-02-16 DIAGNOSIS — Z95810 Presence of automatic (implantable) cardiac defibrillator: Secondary | ICD-10-CM | POA: Diagnosis not present

## 2024-02-16 DIAGNOSIS — I4891 Unspecified atrial fibrillation: Secondary | ICD-10-CM | POA: Diagnosis not present

## 2024-02-16 DIAGNOSIS — Z7901 Long term (current) use of anticoagulants: Secondary | ICD-10-CM | POA: Diagnosis not present

## 2024-02-16 DIAGNOSIS — M1711 Unilateral primary osteoarthritis, right knee: Secondary | ICD-10-CM | POA: Diagnosis not present

## 2024-02-16 DIAGNOSIS — E559 Vitamin D deficiency, unspecified: Secondary | ICD-10-CM | POA: Diagnosis not present

## 2024-02-16 DIAGNOSIS — H3530 Unspecified macular degeneration: Secondary | ICD-10-CM | POA: Diagnosis not present

## 2024-02-17 DIAGNOSIS — Z7984 Long term (current) use of oral hypoglycemic drugs: Secondary | ICD-10-CM | POA: Diagnosis not present

## 2024-02-17 DIAGNOSIS — I4891 Unspecified atrial fibrillation: Secondary | ICD-10-CM | POA: Diagnosis not present

## 2024-02-17 DIAGNOSIS — J449 Chronic obstructive pulmonary disease, unspecified: Secondary | ICD-10-CM | POA: Diagnosis not present

## 2024-02-17 DIAGNOSIS — E119 Type 2 diabetes mellitus without complications: Secondary | ICD-10-CM | POA: Diagnosis not present

## 2024-02-17 DIAGNOSIS — G9340 Encephalopathy, unspecified: Secondary | ICD-10-CM | POA: Diagnosis not present

## 2024-02-17 DIAGNOSIS — I2510 Atherosclerotic heart disease of native coronary artery without angina pectoris: Secondary | ICD-10-CM | POA: Diagnosis not present

## 2024-02-18 DIAGNOSIS — E119 Type 2 diabetes mellitus without complications: Secondary | ICD-10-CM | POA: Diagnosis not present

## 2024-02-18 DIAGNOSIS — J449 Chronic obstructive pulmonary disease, unspecified: Secondary | ICD-10-CM | POA: Diagnosis not present

## 2024-02-18 DIAGNOSIS — I2510 Atherosclerotic heart disease of native coronary artery without angina pectoris: Secondary | ICD-10-CM | POA: Diagnosis not present

## 2024-02-18 DIAGNOSIS — Z7984 Long term (current) use of oral hypoglycemic drugs: Secondary | ICD-10-CM | POA: Diagnosis not present

## 2024-02-18 DIAGNOSIS — I4891 Unspecified atrial fibrillation: Secondary | ICD-10-CM | POA: Diagnosis not present

## 2024-02-18 DIAGNOSIS — G9340 Encephalopathy, unspecified: Secondary | ICD-10-CM | POA: Diagnosis not present

## 2024-02-19 DIAGNOSIS — Z7984 Long term (current) use of oral hypoglycemic drugs: Secondary | ICD-10-CM | POA: Diagnosis not present

## 2024-02-19 DIAGNOSIS — I2510 Atherosclerotic heart disease of native coronary artery without angina pectoris: Secondary | ICD-10-CM | POA: Diagnosis not present

## 2024-02-19 DIAGNOSIS — J449 Chronic obstructive pulmonary disease, unspecified: Secondary | ICD-10-CM | POA: Diagnosis not present

## 2024-02-19 DIAGNOSIS — I4891 Unspecified atrial fibrillation: Secondary | ICD-10-CM | POA: Diagnosis not present

## 2024-02-19 DIAGNOSIS — E119 Type 2 diabetes mellitus without complications: Secondary | ICD-10-CM | POA: Diagnosis not present

## 2024-02-19 DIAGNOSIS — G9340 Encephalopathy, unspecified: Secondary | ICD-10-CM | POA: Diagnosis not present

## 2024-02-20 DIAGNOSIS — I2510 Atherosclerotic heart disease of native coronary artery without angina pectoris: Secondary | ICD-10-CM | POA: Diagnosis not present

## 2024-02-20 DIAGNOSIS — E119 Type 2 diabetes mellitus without complications: Secondary | ICD-10-CM | POA: Diagnosis not present

## 2024-02-20 DIAGNOSIS — I4891 Unspecified atrial fibrillation: Secondary | ICD-10-CM | POA: Diagnosis not present

## 2024-02-20 DIAGNOSIS — Z7984 Long term (current) use of oral hypoglycemic drugs: Secondary | ICD-10-CM | POA: Diagnosis not present

## 2024-02-20 DIAGNOSIS — G9340 Encephalopathy, unspecified: Secondary | ICD-10-CM | POA: Diagnosis not present

## 2024-02-20 DIAGNOSIS — J449 Chronic obstructive pulmonary disease, unspecified: Secondary | ICD-10-CM | POA: Diagnosis not present

## 2024-02-21 DIAGNOSIS — G9340 Encephalopathy, unspecified: Secondary | ICD-10-CM | POA: Diagnosis not present

## 2024-02-21 DIAGNOSIS — E119 Type 2 diabetes mellitus without complications: Secondary | ICD-10-CM | POA: Diagnosis not present

## 2024-02-21 DIAGNOSIS — J449 Chronic obstructive pulmonary disease, unspecified: Secondary | ICD-10-CM | POA: Diagnosis not present

## 2024-02-21 DIAGNOSIS — Z7984 Long term (current) use of oral hypoglycemic drugs: Secondary | ICD-10-CM | POA: Diagnosis not present

## 2024-02-21 DIAGNOSIS — I4891 Unspecified atrial fibrillation: Secondary | ICD-10-CM | POA: Diagnosis not present

## 2024-02-21 DIAGNOSIS — I2510 Atherosclerotic heart disease of native coronary artery without angina pectoris: Secondary | ICD-10-CM | POA: Diagnosis not present

## 2024-02-22 DIAGNOSIS — G9340 Encephalopathy, unspecified: Secondary | ICD-10-CM | POA: Diagnosis not present

## 2024-02-22 DIAGNOSIS — E119 Type 2 diabetes mellitus without complications: Secondary | ICD-10-CM | POA: Diagnosis not present

## 2024-02-22 DIAGNOSIS — J449 Chronic obstructive pulmonary disease, unspecified: Secondary | ICD-10-CM | POA: Diagnosis not present

## 2024-02-22 DIAGNOSIS — Z7984 Long term (current) use of oral hypoglycemic drugs: Secondary | ICD-10-CM | POA: Diagnosis not present

## 2024-02-22 DIAGNOSIS — L97509 Non-pressure chronic ulcer of other part of unspecified foot with unspecified severity: Secondary | ICD-10-CM | POA: Diagnosis not present

## 2024-02-22 DIAGNOSIS — I2510 Atherosclerotic heart disease of native coronary artery without angina pectoris: Secondary | ICD-10-CM | POA: Diagnosis not present

## 2024-02-22 DIAGNOSIS — I4891 Unspecified atrial fibrillation: Secondary | ICD-10-CM | POA: Diagnosis not present

## 2024-02-23 DIAGNOSIS — I87333 Chronic venous hypertension (idiopathic) with ulcer and inflammation of bilateral lower extremity: Secondary | ICD-10-CM | POA: Diagnosis not present

## 2024-02-23 DIAGNOSIS — Z7984 Long term (current) use of oral hypoglycemic drugs: Secondary | ICD-10-CM | POA: Diagnosis not present

## 2024-02-23 DIAGNOSIS — J449 Chronic obstructive pulmonary disease, unspecified: Secondary | ICD-10-CM | POA: Diagnosis not present

## 2024-02-23 DIAGNOSIS — G9340 Encephalopathy, unspecified: Secondary | ICD-10-CM | POA: Diagnosis not present

## 2024-02-23 DIAGNOSIS — I739 Peripheral vascular disease, unspecified: Secondary | ICD-10-CM | POA: Diagnosis not present

## 2024-02-23 DIAGNOSIS — I2510 Atherosclerotic heart disease of native coronary artery without angina pectoris: Secondary | ICD-10-CM | POA: Diagnosis not present

## 2024-02-23 DIAGNOSIS — I5022 Chronic systolic (congestive) heart failure: Secondary | ICD-10-CM | POA: Diagnosis not present

## 2024-02-23 DIAGNOSIS — E119 Type 2 diabetes mellitus without complications: Secondary | ICD-10-CM | POA: Diagnosis not present

## 2024-02-23 DIAGNOSIS — I4891 Unspecified atrial fibrillation: Secondary | ICD-10-CM | POA: Diagnosis not present

## 2024-02-24 DIAGNOSIS — E119 Type 2 diabetes mellitus without complications: Secondary | ICD-10-CM | POA: Diagnosis not present

## 2024-02-24 DIAGNOSIS — G9340 Encephalopathy, unspecified: Secondary | ICD-10-CM | POA: Diagnosis not present

## 2024-02-24 DIAGNOSIS — I4891 Unspecified atrial fibrillation: Secondary | ICD-10-CM | POA: Diagnosis not present

## 2024-02-24 DIAGNOSIS — J449 Chronic obstructive pulmonary disease, unspecified: Secondary | ICD-10-CM | POA: Diagnosis not present

## 2024-02-24 DIAGNOSIS — I2510 Atherosclerotic heart disease of native coronary artery without angina pectoris: Secondary | ICD-10-CM | POA: Diagnosis not present

## 2024-02-24 DIAGNOSIS — Z7984 Long term (current) use of oral hypoglycemic drugs: Secondary | ICD-10-CM | POA: Diagnosis not present

## 2024-02-25 DIAGNOSIS — I2510 Atherosclerotic heart disease of native coronary artery without angina pectoris: Secondary | ICD-10-CM | POA: Diagnosis not present

## 2024-02-25 DIAGNOSIS — E119 Type 2 diabetes mellitus without complications: Secondary | ICD-10-CM | POA: Diagnosis not present

## 2024-02-25 DIAGNOSIS — I4891 Unspecified atrial fibrillation: Secondary | ICD-10-CM | POA: Diagnosis not present

## 2024-02-25 DIAGNOSIS — Z7984 Long term (current) use of oral hypoglycemic drugs: Secondary | ICD-10-CM | POA: Diagnosis not present

## 2024-02-25 DIAGNOSIS — J449 Chronic obstructive pulmonary disease, unspecified: Secondary | ICD-10-CM | POA: Diagnosis not present

## 2024-02-25 DIAGNOSIS — G9340 Encephalopathy, unspecified: Secondary | ICD-10-CM | POA: Diagnosis not present

## 2024-02-26 DIAGNOSIS — I4891 Unspecified atrial fibrillation: Secondary | ICD-10-CM | POA: Diagnosis not present

## 2024-02-26 DIAGNOSIS — G9340 Encephalopathy, unspecified: Secondary | ICD-10-CM | POA: Diagnosis not present

## 2024-02-26 DIAGNOSIS — J449 Chronic obstructive pulmonary disease, unspecified: Secondary | ICD-10-CM | POA: Diagnosis not present

## 2024-02-26 DIAGNOSIS — Z7984 Long term (current) use of oral hypoglycemic drugs: Secondary | ICD-10-CM | POA: Diagnosis not present

## 2024-02-26 DIAGNOSIS — E119 Type 2 diabetes mellitus without complications: Secondary | ICD-10-CM | POA: Diagnosis not present

## 2024-02-26 DIAGNOSIS — I2510 Atherosclerotic heart disease of native coronary artery without angina pectoris: Secondary | ICD-10-CM | POA: Diagnosis not present

## 2024-02-27 DIAGNOSIS — I4891 Unspecified atrial fibrillation: Secondary | ICD-10-CM | POA: Diagnosis not present

## 2024-02-27 DIAGNOSIS — G9340 Encephalopathy, unspecified: Secondary | ICD-10-CM | POA: Diagnosis not present

## 2024-02-27 DIAGNOSIS — E1169 Type 2 diabetes mellitus with other specified complication: Secondary | ICD-10-CM | POA: Diagnosis not present

## 2024-02-27 DIAGNOSIS — I2510 Atherosclerotic heart disease of native coronary artery without angina pectoris: Secondary | ICD-10-CM | POA: Diagnosis not present

## 2024-02-27 DIAGNOSIS — E119 Type 2 diabetes mellitus without complications: Secondary | ICD-10-CM | POA: Diagnosis not present

## 2024-02-27 DIAGNOSIS — Z7984 Long term (current) use of oral hypoglycemic drugs: Secondary | ICD-10-CM | POA: Diagnosis not present

## 2024-02-27 DIAGNOSIS — J449 Chronic obstructive pulmonary disease, unspecified: Secondary | ICD-10-CM | POA: Diagnosis not present

## 2024-02-28 DIAGNOSIS — I2510 Atherosclerotic heart disease of native coronary artery without angina pectoris: Secondary | ICD-10-CM | POA: Diagnosis not present

## 2024-02-28 DIAGNOSIS — I83018 Varicose veins of right lower extremity with ulcer other part of lower leg: Secondary | ICD-10-CM | POA: Diagnosis not present

## 2024-02-28 DIAGNOSIS — I493 Ventricular premature depolarization: Secondary | ICD-10-CM | POA: Diagnosis not present

## 2024-02-28 DIAGNOSIS — Z951 Presence of aortocoronary bypass graft: Secondary | ICD-10-CM | POA: Diagnosis not present

## 2024-02-28 DIAGNOSIS — E782 Mixed hyperlipidemia: Secondary | ICD-10-CM | POA: Diagnosis not present

## 2024-02-28 DIAGNOSIS — I5020 Unspecified systolic (congestive) heart failure: Secondary | ICD-10-CM | POA: Diagnosis not present

## 2024-02-29 DIAGNOSIS — I4891 Unspecified atrial fibrillation: Secondary | ICD-10-CM | POA: Diagnosis not present

## 2024-02-29 DIAGNOSIS — I509 Heart failure, unspecified: Secondary | ICD-10-CM | POA: Diagnosis not present

## 2024-02-29 DIAGNOSIS — J449 Chronic obstructive pulmonary disease, unspecified: Secondary | ICD-10-CM | POA: Diagnosis not present

## 2024-02-29 DIAGNOSIS — E119 Type 2 diabetes mellitus without complications: Secondary | ICD-10-CM | POA: Diagnosis not present

## 2024-03-02 DIAGNOSIS — D649 Anemia, unspecified: Secondary | ICD-10-CM | POA: Diagnosis not present

## 2024-03-02 DIAGNOSIS — E559 Vitamin D deficiency, unspecified: Secondary | ICD-10-CM | POA: Diagnosis not present

## 2024-03-02 DIAGNOSIS — E0590 Thyrotoxicosis, unspecified without thyrotoxic crisis or storm: Secondary | ICD-10-CM | POA: Diagnosis not present

## 2024-03-02 DIAGNOSIS — I10 Essential (primary) hypertension: Secondary | ICD-10-CM | POA: Diagnosis not present

## 2024-03-02 DIAGNOSIS — E119 Type 2 diabetes mellitus without complications: Secondary | ICD-10-CM | POA: Diagnosis not present

## 2024-03-03 DIAGNOSIS — I509 Heart failure, unspecified: Secondary | ICD-10-CM | POA: Diagnosis not present

## 2024-03-05 DIAGNOSIS — E119 Type 2 diabetes mellitus without complications: Secondary | ICD-10-CM | POA: Diagnosis not present

## 2024-03-05 DIAGNOSIS — I509 Heart failure, unspecified: Secondary | ICD-10-CM | POA: Diagnosis not present

## 2024-03-05 DIAGNOSIS — I4891 Unspecified atrial fibrillation: Secondary | ICD-10-CM | POA: Diagnosis not present

## 2024-03-05 DIAGNOSIS — J449 Chronic obstructive pulmonary disease, unspecified: Secondary | ICD-10-CM | POA: Diagnosis not present

## 2024-03-06 DIAGNOSIS — I83018 Varicose veins of right lower extremity with ulcer other part of lower leg: Secondary | ICD-10-CM | POA: Diagnosis not present

## 2024-03-07 DIAGNOSIS — I509 Heart failure, unspecified: Secondary | ICD-10-CM | POA: Diagnosis not present

## 2024-03-07 DIAGNOSIS — E119 Type 2 diabetes mellitus without complications: Secondary | ICD-10-CM | POA: Diagnosis not present

## 2024-03-07 DIAGNOSIS — J449 Chronic obstructive pulmonary disease, unspecified: Secondary | ICD-10-CM | POA: Diagnosis not present

## 2024-03-07 DIAGNOSIS — I739 Peripheral vascular disease, unspecified: Secondary | ICD-10-CM | POA: Diagnosis not present

## 2024-03-09 DIAGNOSIS — E119 Type 2 diabetes mellitus without complications: Secondary | ICD-10-CM | POA: Diagnosis not present

## 2024-03-12 DIAGNOSIS — J449 Chronic obstructive pulmonary disease, unspecified: Secondary | ICD-10-CM | POA: Diagnosis not present

## 2024-03-12 DIAGNOSIS — I10 Essential (primary) hypertension: Secondary | ICD-10-CM | POA: Diagnosis not present

## 2024-03-12 DIAGNOSIS — I4891 Unspecified atrial fibrillation: Secondary | ICD-10-CM | POA: Diagnosis not present

## 2024-03-12 DIAGNOSIS — I509 Heart failure, unspecified: Secondary | ICD-10-CM | POA: Diagnosis not present

## 2024-03-12 DIAGNOSIS — E119 Type 2 diabetes mellitus without complications: Secondary | ICD-10-CM | POA: Diagnosis not present

## 2024-03-13 DIAGNOSIS — E119 Type 2 diabetes mellitus without complications: Secondary | ICD-10-CM | POA: Diagnosis not present

## 2024-03-13 DIAGNOSIS — Z08 Encounter for follow-up examination after completed treatment for malignant neoplasm: Secondary | ICD-10-CM | POA: Diagnosis not present

## 2024-03-13 DIAGNOSIS — E785 Hyperlipidemia, unspecified: Secondary | ICD-10-CM | POA: Diagnosis not present

## 2024-03-13 DIAGNOSIS — I872 Venous insufficiency (chronic) (peripheral): Secondary | ICD-10-CM | POA: Diagnosis not present

## 2024-03-13 DIAGNOSIS — Z85828 Personal history of other malignant neoplasm of skin: Secondary | ICD-10-CM | POA: Diagnosis not present

## 2024-03-21 DIAGNOSIS — I4891 Unspecified atrial fibrillation: Secondary | ICD-10-CM | POA: Diagnosis not present

## 2024-03-21 DIAGNOSIS — J449 Chronic obstructive pulmonary disease, unspecified: Secondary | ICD-10-CM | POA: Diagnosis not present

## 2024-03-21 DIAGNOSIS — I509 Heart failure, unspecified: Secondary | ICD-10-CM | POA: Diagnosis not present

## 2024-03-21 DIAGNOSIS — E119 Type 2 diabetes mellitus without complications: Secondary | ICD-10-CM | POA: Diagnosis not present

## 2024-03-22 DIAGNOSIS — N390 Urinary tract infection, site not specified: Secondary | ICD-10-CM | POA: Diagnosis not present

## 2024-04-02 DIAGNOSIS — I4891 Unspecified atrial fibrillation: Secondary | ICD-10-CM | POA: Diagnosis not present

## 2024-04-02 DIAGNOSIS — R0602 Shortness of breath: Secondary | ICD-10-CM | POA: Diagnosis not present

## 2024-04-02 DIAGNOSIS — R140 Abdominal distension (gaseous): Secondary | ICD-10-CM | POA: Diagnosis not present

## 2024-04-02 DIAGNOSIS — E119 Type 2 diabetes mellitus without complications: Secondary | ICD-10-CM | POA: Diagnosis not present

## 2024-04-03 DIAGNOSIS — I509 Heart failure, unspecified: Secondary | ICD-10-CM | POA: Diagnosis not present

## 2024-04-03 DIAGNOSIS — K560 Paralytic ileus: Secondary | ICD-10-CM | POA: Diagnosis not present

## 2024-04-03 DIAGNOSIS — R0602 Shortness of breath: Secondary | ICD-10-CM | POA: Diagnosis not present

## 2024-04-03 DIAGNOSIS — I10 Essential (primary) hypertension: Secondary | ICD-10-CM | POA: Diagnosis not present

## 2024-04-03 DIAGNOSIS — R109 Unspecified abdominal pain: Secondary | ICD-10-CM | POA: Diagnosis not present

## 2024-04-03 DIAGNOSIS — G9340 Encephalopathy, unspecified: Secondary | ICD-10-CM | POA: Diagnosis not present

## 2024-04-04 DIAGNOSIS — J449 Chronic obstructive pulmonary disease, unspecified: Secondary | ICD-10-CM | POA: Diagnosis not present

## 2024-04-04 DIAGNOSIS — I509 Heart failure, unspecified: Secondary | ICD-10-CM | POA: Diagnosis not present

## 2024-04-04 DIAGNOSIS — I4891 Unspecified atrial fibrillation: Secondary | ICD-10-CM | POA: Diagnosis not present

## 2024-04-04 DIAGNOSIS — R2681 Unsteadiness on feet: Secondary | ICD-10-CM | POA: Diagnosis not present

## 2024-04-04 DIAGNOSIS — H6123 Impacted cerumen, bilateral: Secondary | ICD-10-CM | POA: Diagnosis not present

## 2024-04-04 DIAGNOSIS — I5020 Unspecified systolic (congestive) heart failure: Secondary | ICD-10-CM | POA: Diagnosis not present

## 2024-04-05 DIAGNOSIS — I10 Essential (primary) hypertension: Secondary | ICD-10-CM | POA: Diagnosis not present

## 2024-04-05 DIAGNOSIS — I5020 Unspecified systolic (congestive) heart failure: Secondary | ICD-10-CM | POA: Diagnosis not present

## 2024-04-05 DIAGNOSIS — R2681 Unsteadiness on feet: Secondary | ICD-10-CM | POA: Diagnosis not present

## 2024-04-05 DIAGNOSIS — J449 Chronic obstructive pulmonary disease, unspecified: Secondary | ICD-10-CM | POA: Diagnosis not present

## 2024-04-05 DIAGNOSIS — I509 Heart failure, unspecified: Secondary | ICD-10-CM | POA: Diagnosis not present

## 2024-04-05 DIAGNOSIS — I4891 Unspecified atrial fibrillation: Secondary | ICD-10-CM | POA: Diagnosis not present

## 2024-04-06 DIAGNOSIS — R2681 Unsteadiness on feet: Secondary | ICD-10-CM | POA: Diagnosis not present

## 2024-04-06 DIAGNOSIS — I5020 Unspecified systolic (congestive) heart failure: Secondary | ICD-10-CM | POA: Diagnosis not present

## 2024-04-09 DIAGNOSIS — H6123 Impacted cerumen, bilateral: Secondary | ICD-10-CM | POA: Diagnosis not present

## 2024-04-09 DIAGNOSIS — I4891 Unspecified atrial fibrillation: Secondary | ICD-10-CM | POA: Diagnosis not present

## 2024-04-09 DIAGNOSIS — I10 Essential (primary) hypertension: Secondary | ICD-10-CM | POA: Diagnosis not present

## 2024-04-09 DIAGNOSIS — E1159 Type 2 diabetes mellitus with other circulatory complications: Secondary | ICD-10-CM | POA: Diagnosis not present

## 2024-04-09 DIAGNOSIS — B351 Tinea unguium: Secondary | ICD-10-CM | POA: Diagnosis not present

## 2024-04-09 DIAGNOSIS — I509 Heart failure, unspecified: Secondary | ICD-10-CM | POA: Diagnosis not present

## 2024-04-09 DIAGNOSIS — E119 Type 2 diabetes mellitus without complications: Secondary | ICD-10-CM | POA: Diagnosis not present

## 2024-04-10 DIAGNOSIS — H43813 Vitreous degeneration, bilateral: Secondary | ICD-10-CM | POA: Diagnosis not present

## 2024-04-10 DIAGNOSIS — I5020 Unspecified systolic (congestive) heart failure: Secondary | ICD-10-CM | POA: Diagnosis not present

## 2024-04-10 DIAGNOSIS — R2681 Unsteadiness on feet: Secondary | ICD-10-CM | POA: Diagnosis not present

## 2024-04-10 DIAGNOSIS — H353132 Nonexudative age-related macular degeneration, bilateral, intermediate dry stage: Secondary | ICD-10-CM | POA: Diagnosis not present

## 2024-04-10 DIAGNOSIS — H353233 Exudative age-related macular degeneration, bilateral, with inactive scar: Secondary | ICD-10-CM | POA: Diagnosis not present

## 2024-04-11 DIAGNOSIS — E785 Hyperlipidemia, unspecified: Secondary | ICD-10-CM | POA: Diagnosis not present

## 2024-04-11 DIAGNOSIS — R2681 Unsteadiness on feet: Secondary | ICD-10-CM | POA: Diagnosis not present

## 2024-04-11 DIAGNOSIS — I5020 Unspecified systolic (congestive) heart failure: Secondary | ICD-10-CM | POA: Diagnosis not present

## 2024-04-11 DIAGNOSIS — Z79899 Other long term (current) drug therapy: Secondary | ICD-10-CM | POA: Diagnosis not present

## 2024-04-12 DIAGNOSIS — R2681 Unsteadiness on feet: Secondary | ICD-10-CM | POA: Diagnosis not present

## 2024-04-12 DIAGNOSIS — I5020 Unspecified systolic (congestive) heart failure: Secondary | ICD-10-CM | POA: Diagnosis not present

## 2024-04-13 DIAGNOSIS — R2681 Unsteadiness on feet: Secondary | ICD-10-CM | POA: Diagnosis not present

## 2024-04-13 DIAGNOSIS — I5020 Unspecified systolic (congestive) heart failure: Secondary | ICD-10-CM | POA: Diagnosis not present

## 2024-04-16 DIAGNOSIS — I5020 Unspecified systolic (congestive) heart failure: Secondary | ICD-10-CM | POA: Diagnosis not present

## 2024-04-16 DIAGNOSIS — K567 Ileus, unspecified: Secondary | ICD-10-CM | POA: Diagnosis not present

## 2024-04-16 DIAGNOSIS — R2681 Unsteadiness on feet: Secondary | ICD-10-CM | POA: Diagnosis not present

## 2024-04-17 DIAGNOSIS — I5020 Unspecified systolic (congestive) heart failure: Secondary | ICD-10-CM | POA: Diagnosis not present

## 2024-04-17 DIAGNOSIS — R2681 Unsteadiness on feet: Secondary | ICD-10-CM | POA: Diagnosis not present

## 2024-04-18 DIAGNOSIS — I5020 Unspecified systolic (congestive) heart failure: Secondary | ICD-10-CM | POA: Diagnosis not present

## 2024-04-18 DIAGNOSIS — R2681 Unsteadiness on feet: Secondary | ICD-10-CM | POA: Diagnosis not present

## 2024-04-19 DIAGNOSIS — I5020 Unspecified systolic (congestive) heart failure: Secondary | ICD-10-CM | POA: Diagnosis not present

## 2024-04-19 DIAGNOSIS — R2681 Unsteadiness on feet: Secondary | ICD-10-CM | POA: Diagnosis not present

## 2024-04-20 DIAGNOSIS — I5020 Unspecified systolic (congestive) heart failure: Secondary | ICD-10-CM | POA: Diagnosis not present

## 2024-04-20 DIAGNOSIS — R2681 Unsteadiness on feet: Secondary | ICD-10-CM | POA: Diagnosis not present

## 2024-04-23 DIAGNOSIS — I509 Heart failure, unspecified: Secondary | ICD-10-CM | POA: Diagnosis not present

## 2024-04-23 DIAGNOSIS — J449 Chronic obstructive pulmonary disease, unspecified: Secondary | ICD-10-CM | POA: Diagnosis not present

## 2024-04-23 DIAGNOSIS — I10 Essential (primary) hypertension: Secondary | ICD-10-CM | POA: Diagnosis not present

## 2024-04-23 DIAGNOSIS — I4891 Unspecified atrial fibrillation: Secondary | ICD-10-CM | POA: Diagnosis not present

## 2024-04-24 DIAGNOSIS — R635 Abnormal weight gain: Secondary | ICD-10-CM | POA: Diagnosis not present

## 2024-04-25 DIAGNOSIS — I5020 Unspecified systolic (congestive) heart failure: Secondary | ICD-10-CM | POA: Diagnosis not present

## 2024-04-25 DIAGNOSIS — R2681 Unsteadiness on feet: Secondary | ICD-10-CM | POA: Diagnosis not present

## 2024-04-26 DIAGNOSIS — R2681 Unsteadiness on feet: Secondary | ICD-10-CM | POA: Diagnosis not present

## 2024-04-26 DIAGNOSIS — I5020 Unspecified systolic (congestive) heart failure: Secondary | ICD-10-CM | POA: Diagnosis not present

## 2024-04-27 DIAGNOSIS — I5020 Unspecified systolic (congestive) heart failure: Secondary | ICD-10-CM | POA: Diagnosis not present

## 2024-04-27 DIAGNOSIS — R2681 Unsteadiness on feet: Secondary | ICD-10-CM | POA: Diagnosis not present

## 2024-05-01 DIAGNOSIS — I5020 Unspecified systolic (congestive) heart failure: Secondary | ICD-10-CM | POA: Diagnosis not present

## 2024-05-01 DIAGNOSIS — R2681 Unsteadiness on feet: Secondary | ICD-10-CM | POA: Diagnosis not present

## 2024-05-02 DIAGNOSIS — R2681 Unsteadiness on feet: Secondary | ICD-10-CM | POA: Diagnosis not present

## 2024-05-02 DIAGNOSIS — I5020 Unspecified systolic (congestive) heart failure: Secondary | ICD-10-CM | POA: Diagnosis not present

## 2024-05-03 DIAGNOSIS — I509 Heart failure, unspecified: Secondary | ICD-10-CM | POA: Diagnosis not present

## 2024-05-03 DIAGNOSIS — J449 Chronic obstructive pulmonary disease, unspecified: Secondary | ICD-10-CM | POA: Diagnosis not present

## 2024-05-03 DIAGNOSIS — I4891 Unspecified atrial fibrillation: Secondary | ICD-10-CM | POA: Diagnosis not present

## 2024-05-09 DIAGNOSIS — I509 Heart failure, unspecified: Secondary | ICD-10-CM | POA: Diagnosis not present

## 2024-05-09 DIAGNOSIS — I4891 Unspecified atrial fibrillation: Secondary | ICD-10-CM | POA: Diagnosis not present

## 2024-05-09 DIAGNOSIS — R0602 Shortness of breath: Secondary | ICD-10-CM | POA: Diagnosis not present

## 2024-05-09 DIAGNOSIS — R5381 Other malaise: Secondary | ICD-10-CM | POA: Diagnosis not present

## 2024-05-10 DIAGNOSIS — I10 Essential (primary) hypertension: Secondary | ICD-10-CM | POA: Diagnosis not present

## 2024-05-10 DIAGNOSIS — Z1152 Encounter for screening for COVID-19: Secondary | ICD-10-CM | POA: Diagnosis not present

## 2024-05-10 DIAGNOSIS — R0602 Shortness of breath: Secondary | ICD-10-CM | POA: Diagnosis not present

## 2024-05-14 DIAGNOSIS — J449 Chronic obstructive pulmonary disease, unspecified: Secondary | ICD-10-CM | POA: Diagnosis not present

## 2024-05-14 DIAGNOSIS — I5020 Unspecified systolic (congestive) heart failure: Secondary | ICD-10-CM | POA: Diagnosis not present

## 2024-05-14 DIAGNOSIS — I509 Heart failure, unspecified: Secondary | ICD-10-CM | POA: Diagnosis not present

## 2024-05-14 DIAGNOSIS — I472 Ventricular tachycardia, unspecified: Secondary | ICD-10-CM | POA: Diagnosis not present

## 2024-05-14 DIAGNOSIS — I4891 Unspecified atrial fibrillation: Secondary | ICD-10-CM | POA: Diagnosis not present

## 2024-05-14 DIAGNOSIS — E119 Type 2 diabetes mellitus without complications: Secondary | ICD-10-CM | POA: Diagnosis not present

## 2024-05-15 DIAGNOSIS — Z4502 Encounter for adjustment and management of automatic implantable cardiac defibrillator: Secondary | ICD-10-CM | POA: Diagnosis not present

## 2024-05-15 DIAGNOSIS — R6889 Other general symptoms and signs: Secondary | ICD-10-CM | POA: Diagnosis not present

## 2024-05-15 DIAGNOSIS — I509 Heart failure, unspecified: Secondary | ICD-10-CM | POA: Diagnosis not present

## 2024-05-15 DIAGNOSIS — R0602 Shortness of breath: Secondary | ICD-10-CM | POA: Diagnosis not present

## 2024-05-16 DIAGNOSIS — R2681 Unsteadiness on feet: Secondary | ICD-10-CM | POA: Diagnosis not present

## 2024-05-16 DIAGNOSIS — I5020 Unspecified systolic (congestive) heart failure: Secondary | ICD-10-CM | POA: Diagnosis not present

## 2024-05-16 DIAGNOSIS — E119 Type 2 diabetes mellitus without complications: Secondary | ICD-10-CM | POA: Diagnosis not present

## 2024-05-16 DIAGNOSIS — I509 Heart failure, unspecified: Secondary | ICD-10-CM | POA: Diagnosis not present

## 2024-05-16 DIAGNOSIS — I4891 Unspecified atrial fibrillation: Secondary | ICD-10-CM | POA: Diagnosis not present

## 2024-05-16 DIAGNOSIS — J449 Chronic obstructive pulmonary disease, unspecified: Secondary | ICD-10-CM | POA: Diagnosis not present

## 2024-05-17 DIAGNOSIS — I110 Hypertensive heart disease with heart failure: Secondary | ICD-10-CM | POA: Diagnosis not present

## 2024-05-17 DIAGNOSIS — I4891 Unspecified atrial fibrillation: Secondary | ICD-10-CM | POA: Diagnosis not present

## 2024-05-17 DIAGNOSIS — E612 Magnesium deficiency: Secondary | ICD-10-CM | POA: Diagnosis not present

## 2024-05-17 DIAGNOSIS — J449 Chronic obstructive pulmonary disease, unspecified: Secondary | ICD-10-CM | POA: Diagnosis not present

## 2024-05-22 DIAGNOSIS — I5020 Unspecified systolic (congestive) heart failure: Secondary | ICD-10-CM | POA: Diagnosis not present

## 2024-05-22 DIAGNOSIS — R2681 Unsteadiness on feet: Secondary | ICD-10-CM | POA: Diagnosis not present

## 2024-05-23 DIAGNOSIS — R2681 Unsteadiness on feet: Secondary | ICD-10-CM | POA: Diagnosis not present

## 2024-05-23 DIAGNOSIS — I5020 Unspecified systolic (congestive) heart failure: Secondary | ICD-10-CM | POA: Diagnosis not present

## 2024-05-23 DIAGNOSIS — I10 Essential (primary) hypertension: Secondary | ICD-10-CM | POA: Diagnosis not present

## 2024-05-24 DIAGNOSIS — R52 Pain, unspecified: Secondary | ICD-10-CM | POA: Diagnosis not present

## 2024-05-25 DIAGNOSIS — I509 Heart failure, unspecified: Secondary | ICD-10-CM | POA: Diagnosis not present

## 2024-05-28 DIAGNOSIS — E119 Type 2 diabetes mellitus without complications: Secondary | ICD-10-CM | POA: Diagnosis not present

## 2024-05-28 DIAGNOSIS — J449 Chronic obstructive pulmonary disease, unspecified: Secondary | ICD-10-CM | POA: Diagnosis not present

## 2024-05-28 DIAGNOSIS — I5020 Unspecified systolic (congestive) heart failure: Secondary | ICD-10-CM | POA: Diagnosis not present

## 2024-05-28 DIAGNOSIS — I509 Heart failure, unspecified: Secondary | ICD-10-CM | POA: Diagnosis not present

## 2024-05-28 DIAGNOSIS — R2681 Unsteadiness on feet: Secondary | ICD-10-CM | POA: Diagnosis not present

## 2024-05-28 DIAGNOSIS — K5900 Constipation, unspecified: Secondary | ICD-10-CM | POA: Diagnosis not present

## 2024-05-29 DIAGNOSIS — R0689 Other abnormalities of breathing: Secondary | ICD-10-CM | POA: Diagnosis not present

## 2024-05-29 DIAGNOSIS — R2681 Unsteadiness on feet: Secondary | ICD-10-CM | POA: Diagnosis not present

## 2024-05-29 DIAGNOSIS — Z79899 Other long term (current) drug therapy: Secondary | ICD-10-CM | POA: Diagnosis not present

## 2024-05-29 DIAGNOSIS — E785 Hyperlipidemia, unspecified: Secondary | ICD-10-CM | POA: Diagnosis not present

## 2024-05-29 DIAGNOSIS — I5020 Unspecified systolic (congestive) heart failure: Secondary | ICD-10-CM | POA: Diagnosis not present

## 2024-05-29 DIAGNOSIS — I10 Essential (primary) hypertension: Secondary | ICD-10-CM | POA: Diagnosis not present

## 2024-05-29 DIAGNOSIS — D649 Anemia, unspecified: Secondary | ICD-10-CM | POA: Diagnosis not present

## 2024-05-29 DIAGNOSIS — E119 Type 2 diabetes mellitus without complications: Secondary | ICD-10-CM | POA: Diagnosis not present

## 2024-05-29 DIAGNOSIS — E0590 Thyrotoxicosis, unspecified without thyrotoxic crisis or storm: Secondary | ICD-10-CM | POA: Diagnosis not present

## 2024-05-30 DIAGNOSIS — I509 Heart failure, unspecified: Secondary | ICD-10-CM | POA: Diagnosis not present

## 2024-05-30 DIAGNOSIS — I5020 Unspecified systolic (congestive) heart failure: Secondary | ICD-10-CM | POA: Diagnosis not present

## 2024-05-30 DIAGNOSIS — R2681 Unsteadiness on feet: Secondary | ICD-10-CM | POA: Diagnosis not present

## 2024-05-31 DIAGNOSIS — J449 Chronic obstructive pulmonary disease, unspecified: Secondary | ICD-10-CM | POA: Diagnosis not present

## 2024-05-31 DIAGNOSIS — I4891 Unspecified atrial fibrillation: Secondary | ICD-10-CM | POA: Diagnosis not present

## 2024-05-31 DIAGNOSIS — I10 Essential (primary) hypertension: Secondary | ICD-10-CM | POA: Diagnosis not present

## 2024-05-31 DIAGNOSIS — E119 Type 2 diabetes mellitus without complications: Secondary | ICD-10-CM | POA: Diagnosis not present

## 2024-05-31 DIAGNOSIS — I509 Heart failure, unspecified: Secondary | ICD-10-CM | POA: Diagnosis not present

## 2024-05-31 DIAGNOSIS — J811 Chronic pulmonary edema: Secondary | ICD-10-CM | POA: Diagnosis not present

## 2024-06-01 DIAGNOSIS — I5020 Unspecified systolic (congestive) heart failure: Secondary | ICD-10-CM | POA: Diagnosis not present

## 2024-06-01 DIAGNOSIS — R2681 Unsteadiness on feet: Secondary | ICD-10-CM | POA: Diagnosis not present

## 2024-06-04 DIAGNOSIS — J449 Chronic obstructive pulmonary disease, unspecified: Secondary | ICD-10-CM | POA: Diagnosis not present

## 2024-06-04 DIAGNOSIS — K5900 Constipation, unspecified: Secondary | ICD-10-CM | POA: Diagnosis not present

## 2024-06-04 DIAGNOSIS — I509 Heart failure, unspecified: Secondary | ICD-10-CM | POA: Diagnosis not present

## 2024-06-04 DIAGNOSIS — R748 Abnormal levels of other serum enzymes: Secondary | ICD-10-CM | POA: Diagnosis not present

## 2024-06-05 DIAGNOSIS — R2681 Unsteadiness on feet: Secondary | ICD-10-CM | POA: Diagnosis not present

## 2024-06-05 DIAGNOSIS — I5020 Unspecified systolic (congestive) heart failure: Secondary | ICD-10-CM | POA: Diagnosis not present

## 2024-06-06 DIAGNOSIS — J449 Chronic obstructive pulmonary disease, unspecified: Secondary | ICD-10-CM | POA: Diagnosis not present

## 2024-06-06 DIAGNOSIS — H6122 Impacted cerumen, left ear: Secondary | ICD-10-CM | POA: Diagnosis not present

## 2024-06-06 DIAGNOSIS — I509 Heart failure, unspecified: Secondary | ICD-10-CM | POA: Diagnosis not present

## 2024-06-06 DIAGNOSIS — R748 Abnormal levels of other serum enzymes: Secondary | ICD-10-CM | POA: Diagnosis not present

## 2024-06-07 DIAGNOSIS — I5020 Unspecified systolic (congestive) heart failure: Secondary | ICD-10-CM | POA: Diagnosis not present

## 2024-06-07 DIAGNOSIS — R2681 Unsteadiness on feet: Secondary | ICD-10-CM | POA: Diagnosis not present

## 2024-06-11 DIAGNOSIS — E1159 Type 2 diabetes mellitus with other circulatory complications: Secondary | ICD-10-CM | POA: Diagnosis not present

## 2024-06-11 DIAGNOSIS — B351 Tinea unguium: Secondary | ICD-10-CM | POA: Diagnosis not present

## 2024-06-12 DIAGNOSIS — R2681 Unsteadiness on feet: Secondary | ICD-10-CM | POA: Diagnosis not present

## 2024-06-12 DIAGNOSIS — I5020 Unspecified systolic (congestive) heart failure: Secondary | ICD-10-CM | POA: Diagnosis not present

## 2024-06-14 DIAGNOSIS — R2681 Unsteadiness on feet: Secondary | ICD-10-CM | POA: Diagnosis not present

## 2024-06-14 DIAGNOSIS — I5020 Unspecified systolic (congestive) heart failure: Secondary | ICD-10-CM | POA: Diagnosis not present

## 2024-06-15 DIAGNOSIS — I10 Essential (primary) hypertension: Secondary | ICD-10-CM | POA: Diagnosis not present

## 2024-06-15 DIAGNOSIS — R7309 Other abnormal glucose: Secondary | ICD-10-CM | POA: Diagnosis not present

## 2024-06-15 DIAGNOSIS — R2681 Unsteadiness on feet: Secondary | ICD-10-CM | POA: Diagnosis not present

## 2024-06-15 DIAGNOSIS — I5020 Unspecified systolic (congestive) heart failure: Secondary | ICD-10-CM | POA: Diagnosis not present

## 2024-06-19 DIAGNOSIS — I5020 Unspecified systolic (congestive) heart failure: Secondary | ICD-10-CM | POA: Diagnosis not present

## 2024-06-19 DIAGNOSIS — R2681 Unsteadiness on feet: Secondary | ICD-10-CM | POA: Diagnosis not present

## 2024-06-20 DIAGNOSIS — R682 Dry mouth, unspecified: Secondary | ICD-10-CM | POA: Diagnosis not present

## 2024-06-20 DIAGNOSIS — R0602 Shortness of breath: Secondary | ICD-10-CM | POA: Diagnosis not present

## 2024-06-20 DIAGNOSIS — R2681 Unsteadiness on feet: Secondary | ICD-10-CM | POA: Diagnosis not present

## 2024-06-20 DIAGNOSIS — R748 Abnormal levels of other serum enzymes: Secondary | ICD-10-CM | POA: Diagnosis not present

## 2024-06-20 DIAGNOSIS — I509 Heart failure, unspecified: Secondary | ICD-10-CM | POA: Diagnosis not present

## 2024-06-20 DIAGNOSIS — I5020 Unspecified systolic (congestive) heart failure: Secondary | ICD-10-CM | POA: Diagnosis not present

## 2024-06-21 DIAGNOSIS — R946 Abnormal results of thyroid function studies: Secondary | ICD-10-CM | POA: Diagnosis not present

## 2024-06-21 DIAGNOSIS — R2681 Unsteadiness on feet: Secondary | ICD-10-CM | POA: Diagnosis not present

## 2024-06-21 DIAGNOSIS — I509 Heart failure, unspecified: Secondary | ICD-10-CM | POA: Diagnosis not present

## 2024-06-21 DIAGNOSIS — I10 Essential (primary) hypertension: Secondary | ICD-10-CM | POA: Diagnosis not present

## 2024-06-21 DIAGNOSIS — I5020 Unspecified systolic (congestive) heart failure: Secondary | ICD-10-CM | POA: Diagnosis not present

## 2024-06-22 DIAGNOSIS — R2681 Unsteadiness on feet: Secondary | ICD-10-CM | POA: Diagnosis not present

## 2024-06-22 DIAGNOSIS — R0602 Shortness of breath: Secondary | ICD-10-CM | POA: Diagnosis not present

## 2024-06-22 DIAGNOSIS — K760 Fatty (change of) liver, not elsewhere classified: Secondary | ICD-10-CM | POA: Diagnosis not present

## 2024-06-22 DIAGNOSIS — I5020 Unspecified systolic (congestive) heart failure: Secondary | ICD-10-CM | POA: Diagnosis not present

## 2024-06-25 DIAGNOSIS — R748 Abnormal levels of other serum enzymes: Secondary | ICD-10-CM | POA: Diagnosis not present

## 2024-06-25 DIAGNOSIS — I5020 Unspecified systolic (congestive) heart failure: Secondary | ICD-10-CM | POA: Diagnosis not present

## 2024-06-25 DIAGNOSIS — J449 Chronic obstructive pulmonary disease, unspecified: Secondary | ICD-10-CM | POA: Diagnosis not present

## 2024-06-25 DIAGNOSIS — K5900 Constipation, unspecified: Secondary | ICD-10-CM | POA: Diagnosis not present

## 2024-06-25 DIAGNOSIS — R2681 Unsteadiness on feet: Secondary | ICD-10-CM | POA: Diagnosis not present

## 2024-06-25 DIAGNOSIS — I509 Heart failure, unspecified: Secondary | ICD-10-CM | POA: Diagnosis not present

## 2024-06-26 DIAGNOSIS — R2681 Unsteadiness on feet: Secondary | ICD-10-CM | POA: Diagnosis not present

## 2024-06-26 DIAGNOSIS — I5020 Unspecified systolic (congestive) heart failure: Secondary | ICD-10-CM | POA: Diagnosis not present

## 2024-06-27 DIAGNOSIS — I5020 Unspecified systolic (congestive) heart failure: Secondary | ICD-10-CM | POA: Diagnosis not present

## 2024-06-27 DIAGNOSIS — R2681 Unsteadiness on feet: Secondary | ICD-10-CM | POA: Diagnosis not present

## 2024-06-27 DIAGNOSIS — I509 Heart failure, unspecified: Secondary | ICD-10-CM | POA: Diagnosis not present

## 2024-06-27 DIAGNOSIS — J449 Chronic obstructive pulmonary disease, unspecified: Secondary | ICD-10-CM | POA: Diagnosis not present

## 2024-06-27 DIAGNOSIS — K5900 Constipation, unspecified: Secondary | ICD-10-CM | POA: Diagnosis not present

## 2024-06-27 DIAGNOSIS — H6590 Unspecified nonsuppurative otitis media, unspecified ear: Secondary | ICD-10-CM | POA: Diagnosis not present

## 2024-06-28 DIAGNOSIS — R748 Abnormal levels of other serum enzymes: Secondary | ICD-10-CM | POA: Diagnosis not present

## 2024-06-28 DIAGNOSIS — I509 Heart failure, unspecified: Secondary | ICD-10-CM | POA: Diagnosis not present

## 2024-06-28 DIAGNOSIS — H6590 Unspecified nonsuppurative otitis media, unspecified ear: Secondary | ICD-10-CM | POA: Diagnosis not present

## 2024-06-28 DIAGNOSIS — K5900 Constipation, unspecified: Secondary | ICD-10-CM | POA: Diagnosis not present

## 2024-07-04 DIAGNOSIS — I10 Essential (primary) hypertension: Secondary | ICD-10-CM | POA: Diagnosis not present

## 2024-07-05 DIAGNOSIS — I10 Essential (primary) hypertension: Secondary | ICD-10-CM | POA: Diagnosis not present

## 2024-07-05 DIAGNOSIS — J449 Chronic obstructive pulmonary disease, unspecified: Secondary | ICD-10-CM | POA: Diagnosis not present

## 2024-07-05 DIAGNOSIS — I509 Heart failure, unspecified: Secondary | ICD-10-CM | POA: Diagnosis not present

## 2024-07-05 DIAGNOSIS — I4891 Unspecified atrial fibrillation: Secondary | ICD-10-CM | POA: Diagnosis not present

## 2024-07-06 DIAGNOSIS — H353233 Exudative age-related macular degeneration, bilateral, with inactive scar: Secondary | ICD-10-CM | POA: Diagnosis not present

## 2024-07-06 DIAGNOSIS — H353112 Nonexudative age-related macular degeneration, right eye, intermediate dry stage: Secondary | ICD-10-CM | POA: Diagnosis not present

## 2024-07-06 DIAGNOSIS — H43393 Other vitreous opacities, bilateral: Secondary | ICD-10-CM | POA: Diagnosis not present

## 2024-07-06 DIAGNOSIS — H353123 Nonexudative age-related macular degeneration, left eye, advanced atrophic without subfoveal involvement: Secondary | ICD-10-CM | POA: Diagnosis not present

## 2024-07-06 DIAGNOSIS — H43813 Vitreous degeneration, bilateral: Secondary | ICD-10-CM | POA: Diagnosis not present

## 2024-07-08 NOTE — Progress Notes (Unsigned)
 Bryan Pratt, male    DOB: Apr 18, 1941   MRN: [id number]   Brief patient profile:  5  yowm never smoker  referred to pulmonary clinic 07/12/2024 by Darina Lesches MD  for doe  onset around 2005 at wt 209         Phone care 2021 for COVID = only contact with PCCM  in EPIC with 4 bouts with covid last in winter 2024 back to baseline each time  PFTs  reported with nl dlco 2019 /mod restriction but actual full study not in epic/care everywhere   June 26 23  elevated R HD / clear lungs   History of Present Illness  07/12/2024  Pulmonary/ 1st office eval/Dymir Neeson @wt  234 on amiodarone  x about 3 m per wake (1st dosed in 2014 per EPIC med review)  Chief Complaint  Patient presents with   Consult    Worsening SOB, only on exertion.   Dyspnea:  power walk at lunch one year prior to OV  x 30 min about a mile and now 150 ft in hallway = has to sit down due to fatigue and doe  Cough: none  Sleep: bed is 30 degrees and one pillow (o/w bad gerd x years )  SABA use: no better  02 ldz:wnwz    No obvious day to day or daytime pattern/variability or assoc excess/ purulent sputum or mucus plugs or hemoptysis or cp or chest tightness, subjective wheeze or overt sinus or hb symptoms.    Also denies any obvious fluctuation of symptoms with weather or environmental changes or other aggravating or alleviating factors except as outlined above   No unusual exposure hx or h/o childhood pna/ asthma or knowledge of premature birth.  Current Allergies, Complete Past Medical History, Past Surgical History, Family History, and Social History were reviewed in [employer] record.  ROS  The following are not active complaints unless bolded Hoarseness, sore throat, dysphagia, dental problems, itching, sneezing,  nasal congestion or discharge of excess mucus or purulent secretions, ear ache,   fever, chills, sweats, unintended wt loss or wt gain, classically pleuritic or exertional cp,  orthopnea pnd  or arm/hand swelling  or leg swelling(less  than usual) , presyncope, palpitations, abdominal pain, anorexia, nausea, vomiting, diarrhea  or change in bowel habits or change in bladder habits, change in stools or change in urine, dysuria, hematuria,  rash, arthralgias, visual complaints, headache, numbness, weakness or ataxia or problems with walking or coordination,  change in mood or  memory.             Outpatient Medications Prior to Visit  Medication Sig Dispense Refill   acetaminophen  (TYLENOL ) 325 MG tablet Take 650 mg by mouth every 6 (six) hours as needed.     albuterol  (VENTOLIN  HFA) 108 (90 Base) MCG/ACT inhaler Inhale 1-2 puffs into the lungs every 4 (four) hours as needed for wheezing or shortness of breath.     amiodarone  (PACERONE ) 200 MG tablet Take 100 mg by mouth daily.     Ascorbic Acid  (VITAMIN C ) 100 MG tablet Take 100 mg by mouth daily.     atorvastatin  (LIPITOR) 40 MG tablet Take 40 mg by mouth daily.     beta carotene w/minerals (OCUVITE) tablet Take 1 tablet by mouth 2 (two) times daily.     cholecalciferol (VITAMIN D) 1000 units tablet Take 1,000 Units by mouth daily.     Cyanocobalamin  (VITAMIN B 12) 500 MCG TABS Take 500 mcg by mouth  daily.     Docusate Calcium  (STOOL SOFTENER PO) Take 100 mg by mouth daily as needed (constipation).     ELIQUIS 5 MG TABS tablet Take 5 mg by mouth 2 (two) times daily.     furosemide  (LASIX ) 40 MG tablet Take 1 tablet (40 mg total) by mouth daily. (Patient taking differently: Take 80 mg by mouth 2 (two) times daily.) 90 tablet 2   gabapentin  (NEURONTIN ) 300 MG capsule Take 1 capsule (300 mg total) by mouth at bedtime. 30 capsule 0   glimepiride  (AMARYL ) 2 MG tablet Take 2 mg by mouth daily with breakfast.     ivabradine (CORLANOR) 5 MG TABS tablet Take 5 mg by mouth 2 (two) times daily with a meal.     magnesium oxide (MAG-OX) 400 (240 Mg) MG tablet Take 400 mg by mouth daily.     methocarbamol  (ROBAXIN ) 500 MG tablet Take 1 tablet  (500 mg total) by mouth every 6 (six) hours as needed for muscle spasms. 60 tablet 0   Multiple Vitamins-Minerals (MULTIVITAMIN PO) Take 1 tablet by mouth daily.     Multiple Vitamins-Minerals (PRESERVISION AREDS 2 PO) Take by mouth.     nitroGLYCERIN  (NITROSTAT ) 0.4 MG SL tablet Place 1 tablet (0.4 mg total) under the tongue every 5 (five) minutes as needed for chest pain. 25 tablet 11   ondansetron  (ZOFRAN ) 4 MG tablet Take 1 tablet (4 mg total) by mouth every 6 (six) hours as needed for nausea. 20 tablet 0   polyethylene glycol (MIRALAX / GLYCOLAX) 17 g packet Take 17 g by mouth daily.     potassium chloride  (KLOR-CON ) 10 MEQ tablet Take 1 tablet by mouth daily as needed (when he feels like he needs it).     quiniDINE gluconate  324 MG CR tablet Take 324 mg by mouth daily.     spironolactone  (ALDACTONE ) 25 MG tablet Take 25 mg by mouth daily.     tobramycin (TOBREX) 0.3 % ophthalmic solution Place 1 drop into the right eye 2 (two) times daily. Instructed to take eye drop 2 days before and 1 day after eye treatment.     torsemide (DEMADEX) 20 MG tablet Take 20 mg by mouth daily.     traMADol  (ULTRAM ) 50 MG tablet Take 50 mg by mouth every 6 (six) hours as needed for moderate pain.      triamcinolone  (KENALOG ) 0.025 % cream Apply 1 Application topically 2 (two) times daily.     VITAMIN E PO Take 1,000 Units by mouth daily.     No facility-administered medications prior to visit.    Past Medical History:  Diagnosis Date   AICD (automatic cardioverter/defibrillator) present 2020   Arthritis    knees, shoulders,   Atrial fibrillation (HCC)    CAD (coronary artery disease)    s/p cath, occluded LAD with collateral fillling, 2008, last cath 03/03/2009 with CTO of the LAD multivessell.   CHF (congestive heart failure) (HCC)    Complication of anesthesia    blood pressure drop   Coronary atherosclerosis of native coronary artery    Diabetes (HCC)    GERD (gastroesophageal reflux disease)     Hyperlipidemia    Hypertension    Pt denies   Macular degeneration    Obesity    Rotator cuff tear, left       Objective:     BP 114/64   Pulse 83   Ht 5' 8.5 (1.74 m)   Wt 234 lb (106.1 kg)  SpO2 93% Comment: RA  BMI 35.06 kg/m   SpO2: 93 % (RA) amb somber mod obese (by BMI ) wm nad    HEENT : Oropharynx  clear      Nasal turbinates clear    NECK :  without  apparent JVD/ palpable Nodes/TM    LUNGS: no acc muscle use,  Nl contour chest which is clear to A and P bilaterally without cough on insp or exp maneuvers   CV:  RRR  no s3 or murmur or increase in P2, and trace pitting both LEs  ABD:  massively obese but soft and nontender   MS:  Gait nl   ext warm without deformities Or obvious joint restrictions  calf tenderness, cyanosis or clubbing    SKIN: warm and dry with classic severe venous stasis dematitis both LEs knees down with dependent rubor    NEURO:  alert, approp, nl sensorium with  no motor or cerebellar deficits apparent.   CXR PA and Lateral:   07/12/2024 :    I personally reviewed images and impression is as follows:  small lung volumes    ? ILD By lateral but not obvious on PA  Labs ordered/ reviewed:      Chemistry      Component Value Date/Time   NA 141 07/12/2024 1451   NA 143 01/12/2017 1501   K 3.8 07/12/2024 1451   CL 96 07/12/2024 1451   CO2 31 07/12/2024 1451   BUN 16 07/12/2024 1451   BUN 19 01/12/2017 1501   CREATININE 0.94 07/12/2024 1451      Component Value Date/Time   CALCIUM  9.7 07/12/2024 1451   ALKPHOS 104 10/16/2021 1355   AST 42 (H) 10/16/2021 1355   ALT 41 10/16/2021 1355   BILITOT 1.0 10/16/2021 1355        Lab Results  Component Value Date   WBC 6.4 07/12/2024   HGB 17.4 (H) 07/12/2024   HCT 51.2 07/12/2024   MCV 101.5 (H) 07/12/2024   PLT 163.0 07/12/2024     Lab Results  Component Value Date   DDIMER 0.42 07/12/2024      Lab Results  Component Value Date   TSH 3.45 07/12/2024     Lab  Results  Component Value Date   PROBNP 69.0 07/12/2024       Lab Results  Component Value Date   ESRSEDRATE 30 (H) 07/12/2024          Assessment     Assessment & Plan DOE (dyspnea on exertion) Never smoker 1st rx for Amiodarone  in Epic = 2014  - 07/12/2024 @ wt 234   Walked on RA  x  3  lap(s) =  approx 500  ft  @  slow pace/ stopped due to sob/ fatigue   with lowest 02 sats 93% but improved to 97% by end    Symptoms are markedly disproportionate to objective findings and not clear to what extent this is actually a pulmonary  problem but pt does appear to have difficult to sort out respiratory symptoms of unknown origin for which  DDX  = almost all start with A and  include Adherence, Ace Inhibitors, Acid Reflux, Active Sinus Disease, Alpha 1 Antitripsin deficiency, Anxiety masquerading as Airways dz,  ABPA,  Allergy/asthma(esp in young), Aspiration (esp in elderly), Adverse effects of meds(esp amiodarone ) ,  Active smoking or Vaping, A bunch of PE's/clot burden (a few small clots can't cause this syndrome unless there is already severe underlying pulm  or vascular dz with poor reserve),  Anemia or thyroid  disorder, plus two Bs  = Bronchiectasis and Beta blocker use..and one C= CHF    Hx and doe labs cover all the above x for amiodarone  toxicity with ESR 30  > BNP 69 supportive of inflammatory lung dz more likely than pulmonary edema   Patients typically have been on amiodarone  for 6-12 months before this complication manifests.  Of note, serial clinical evaluation for symptoms such as cough dyspnea or fevers is  the preferred method of monitoring for pulmonary toxicity because a decrease in DLCO or lung volumes is a nonspecific for toxicity. Pathologically amiodarone  pulmonary toxicity may appear as interstitial pneumonitis, eosinophilic pneumonia, organizing pneumonia, pulmonary fibrosis or less commonly as diffuse alveolar hemorrhage, pulmonary nodules or pleural effusions.  Risk factors  for pulmonary toxicity include age greater than 60, daily dose greater than equal to 400 mg, a high cumulative dose, or pre-existing lung disease  so he has 2/4 risk factors and will need close f/u with serial 02 sats with ex and PFTs now   Discussed in detail all the  indications, usual  risks and alternatives  relative to the benefits with patient who agrees to proceed with w/u as outlined.     F/u as needed  once labs  / PFTs available.   Each maintenance medication was reviewed in detail including emphasizing most importantly the difference between maintenance and prns and under what circumstances the prns are to be triggered using an action plan format where appropriate.  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

## 2024-07-12 ENCOUNTER — Encounter: Payer: Self-pay | Admitting: Internal Medicine

## 2024-07-12 ENCOUNTER — Ambulatory Visit (INDEPENDENT_AMBULATORY_CARE_PROVIDER_SITE_OTHER)

## 2024-07-12 ENCOUNTER — Ambulatory Visit (INDEPENDENT_AMBULATORY_CARE_PROVIDER_SITE_OTHER): Admitting: Internal Medicine

## 2024-07-12 VITALS — BP 114/64 | HR 83 | Ht 68.5 in | Wt 234.0 lb

## 2024-07-12 DIAGNOSIS — R0609 Other forms of dyspnea: Secondary | ICD-10-CM | POA: Diagnosis not present

## 2024-07-12 LAB — CBC WITH DIFFERENTIAL/PLATELET
Basophils Absolute: 0.1 K/uL (ref 0.0–0.1)
Basophils Relative: 1.4 % (ref 0.0–3.0)
Eosinophils Absolute: 0.2 K/uL (ref 0.0–0.7)
Eosinophils Relative: 2.6 % (ref 0.0–5.0)
HCT: 51.2 % (ref 39.0–52.0)
Hemoglobin: 17.4 g/dL — ABNORMAL HIGH (ref 13.0–17.0)
Lymphocytes Relative: 18.9 % (ref 12.0–46.0)
Lymphs Abs: 1.2 K/uL (ref 0.7–4.0)
MCHC: 34.1 g/dL (ref 30.0–36.0)
MCV: 101.5 fl — ABNORMAL HIGH (ref 78.0–100.0)
Monocytes Absolute: 0.5 K/uL (ref 0.1–1.0)
Monocytes Relative: 7.9 % (ref 3.0–12.0)
Neutro Abs: 4.4 K/uL (ref 1.4–7.7)
Neutrophils Relative %: 69.2 % (ref 43.0–77.0)
Platelets: 163 K/uL (ref 150.0–400.0)
RBC: 5.04 Mil/uL (ref 4.22–5.81)
RDW: 14.9 % (ref 11.5–15.5)
WBC: 6.4 K/uL (ref 4.0–10.5)

## 2024-07-12 LAB — BRAIN NATRIURETIC PEPTIDE: Pro B Natriuretic peptide (BNP): 69 pg/mL (ref 0.0–100.0)

## 2024-07-12 LAB — SEDIMENTATION RATE: Sed Rate: 30 mm/h — ABNORMAL HIGH (ref 0–20)

## 2024-07-12 LAB — TSH: TSH: 3.45 u[IU]/mL (ref 0.35–5.50)

## 2024-07-12 LAB — D-DIMER, QUANTITATIVE: D-Dimer, Quant: 0.42 ug{FEU}/mL (ref <0.50)

## 2024-07-12 NOTE — Patient Instructions (Addendum)
 Please remember to go to the  x-ray department  for your tests - we will call you with the results when they are available    Please remember to go to the lab department   for your tests - we will call you with the results when they are available.     PFT 's next available and I will call you the results with results and where to go from there   .

## 2024-07-12 NOTE — Assessment & Plan Note (Addendum)
 Never smoker 1st rx for Amiodarone  in Epic = 2014  - 07/12/2024 @ wt 234   Walked on RA  x  3  lap(s) =  approx 500  ft  @  slow pace/ stopped due to sob/ fatigue   with lowest 02 sats 93% but improved to 97% by end    Symptoms are markedly disproportionate to objective findings and not clear to what extent this is actually a pulmonary  problem but pt does appear to have difficult to sort out respiratory symptoms of unknown origin for which  DDX  = almost all start with A and  include Adherence, Ace Inhibitors, Acid Reflux, Active Sinus Disease, Alpha 1 Antitripsin deficiency, Anxiety masquerading as Airways dz,  ABPA,  Allergy/asthma(esp in young), Aspiration (esp in elderly), Adverse effects of meds(esp amiodarone ) ,  Active smoking or Vaping, A bunch of PE's/clot burden (a few small clots can't cause this syndrome unless there is already severe underlying pulm or vascular dz with poor reserve),  Anemia or thyroid  disorder, plus two Bs  = Bronchiectasis and Beta blocker use..and one C= CHF    Hx and doe labs cover all the above x for amiodarone  toxicity  Patients typically have been on amiodarone  for 6-12 months before this complication manifests.  Of note, serial clinical evaluation for symptoms such as cough dyspnea or fevers is  the preferred method of monitoring for pulmonary toxicity because a decrease in DLCO or lung volumes is a nonspecific for toxicity. Pathologically amiodarone  pulmonary toxicity may appear as interstitial pneumonitis, eosinophilic pneumonia, organizing pneumonia, pulmonary fibrosis or less commonly as diffuse alveolar hemorrhage, pulmonary nodules or pleural effusions.  Risk factors for pulmonary toxicity include age greater than 60, daily dose greater than equal to 400 mg, a high cumulative dose, or pre-existing lung disease  so he has 2/4 risk factors and will need close f/u with serial 02 sats with ex and PFTs now   Discussed in detail all the  indications, usual  risks and  alternatives  relative to the benefits with patient who agrees to proceed with w/u as outlined.     F/u as needed  once labs  / PFTs available.   Each maintenance medication was reviewed in detail including emphasizing most importantly the difference between maintenance and prns and under what circumstances the prns are to be triggered using an action plan format where appropriate.  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

## 2024-07-13 ENCOUNTER — Ambulatory Visit: Payer: Self-pay | Admitting: Internal Medicine

## 2024-07-13 LAB — BASIC METABOLIC PANEL WITH GFR
BUN: 16 mg/dL (ref 6–23)
CO2: 31 meq/L (ref 19–32)
Calcium: 9.7 mg/dL (ref 8.4–10.5)
Chloride: 96 meq/L (ref 96–112)
Creatinine, Ser: 0.94 mg/dL (ref 0.40–1.50)
GFR: 76.48 mL/min (ref >60.00)
Glucose, Bld: 184 mg/dL — ABNORMAL HIGH (ref 70–99)
Potassium: 3.8 meq/L (ref 3.5–5.1)
Sodium: 141 meq/L (ref 135–145)

## 2024-07-13 NOTE — Progress Notes (Signed)
 Called the pt and there was no answer- LMTCB

## 2024-07-19 ENCOUNTER — Ambulatory Visit (INDEPENDENT_AMBULATORY_CARE_PROVIDER_SITE_OTHER): Admitting: Internal Medicine

## 2024-07-19 DIAGNOSIS — R0609 Other forms of dyspnea: Secondary | ICD-10-CM | POA: Diagnosis not present

## 2024-07-19 LAB — PULMONARY FUNCTION TEST
DL/VA % pred: 142 %
DL/VA: 5.59 ml/min/mmHg/L
DLCO cor % pred: 66 %
DLCO cor: 15.49 ml/min/mmHg
DLCO unc % pred: 71 %
DLCO unc: 16.58 ml/min/mmHg
FEF 25-75 Post: 1.43 L/s
FEF 25-75 Pre: 0.83 L/s
FEF2575-%Change-Post: 72 %
FEF2575-%Pred-Post: 77 %
FEF2575-%Pred-Pre: 45 %
FEV1-%Change-Post: 14 %
FEV1-%Pred-Post: 43 %
FEV1-%Pred-Pre: 37 %
FEV1-Post: 1.16 L
FEV1-Pre: 1.01 L
FEV1FVC-%Change-Post: 0 %
FEV1FVC-%Pred-Pre: 111 %
FEV6-%Change-Post: 13 %
FEV6-%Pred-Post: 41 %
FEV6-%Pred-Pre: 36 %
FEV6-Post: 1.44 L
FEV6-Pre: 1.27 L
FEV6FVC-%Change-Post: 0 %
FEV6FVC-%Pred-Post: 107 %
FEV6FVC-%Pred-Pre: 107 %
FVC-%Change-Post: 13 %
FVC-%Pred-Post: 38 %
FVC-%Pred-Pre: 33 %
FVC-Post: 1.45 L
FVC-Pre: 1.27 L
Post FEV1/FVC ratio: 80 %
Post FEV6/FVC ratio: 100 %
Pre FEV1/FVC ratio: 79 %
Pre FEV6/FVC Ratio: 100 %

## 2024-07-19 NOTE — Telephone Encounter (Signed)
 Copied from CRM [id number]. Topic: Clinical - Lab/Test Results >> Jul 17, 2024 11:09 AM Benton KIDD wrote: Reason for CRM: patient returning call he received about his test results . Has been trying to reach someone and no one has reached back out to patient . Calling cal and she is going to check and see if someone is available to speak with patient about results  No one was available .please reach out to patient about results  [phone]  ATCx1 in regards to results

## 2024-07-19 NOTE — Telephone Encounter (Signed)
 Pt was in clinic for PFT  I notified him of the lab and cxr results Pt verbalized understanding  Copy to PCP and cards Copy of results of labs printed for him per his request and also faxed copy to his provider at [facility] per his request-   Phone: [phone] fax [phone] [facility]   Dr Darlean- your last ov note indicated that his f/u would depend on the PFT results.  Please advise on these results, thanks!

## 2024-07-19 NOTE — Progress Notes (Signed)
 Pre/post spiro and DLCO performed today.

## 2024-07-19 NOTE — Patient Instructions (Signed)
 Pre/post spiro and DLCO performed today.

## 2024-07-19 NOTE — Telephone Encounter (Signed)
 Melvenia Wilford SAUNDERS, [place]    07/19/24 10:30 AM Note Copied from CRM #[id number]. Topic: Clinical - Lab/Test Results >> Jul 17, 2024 11:09 AM Benton KIDD wrote: Reason for CRM: patient returning call he received about his test results . Has been trying to reach someone and no one has reached back out to patient . Calling cal and she is going to check and see if someone is available to speak with patient about results  No one was available .please reach out to patient about results  [phone]   ATCx1 in regards to results     Ruffus Lucie DASEN, [facility]    07/19/24  9:26 AM Note Atc x1 left detailed message on vm per dpr. NFN       DG Chest 2 View  Darlean Ozell NOVAK, MD to [facility]    07/19/24  9:00 AM Result Note Call pt:  Reviewed cxr and no acute change so no change in recommendations made at [facility] Chest 2 View    07/17/24 11:05 AM Geralynn Lynwood SAUNDERS contacted Bertie Benton D  Me    07/13/24  1:46 PM Note Called the pt and there was no answer- LMTCB.        Basic metabolic panel with GFR; Brain natriuretic peptide; CBC with Differential/Platelet; D-dimer, quantitative; TSH (1 more orders)  Darlean Ozell NOVAK, MD to [facility]     07/13/24 10:36 AM Result Note Call patient :  Studies are suggestive of possible amiodarone  effects but not  of immediate concern   Send copy of this note and his OV to his cadiologist and make sure his PCP gets a copy of the result/result note

## 2024-07-19 NOTE — Progress Notes (Signed)
 Atc x1 left detailed message on vm per dpr. NFN

## 2024-07-20 DIAGNOSIS — I509 Heart failure, unspecified: Secondary | ICD-10-CM | POA: Diagnosis not present

## 2024-07-20 DIAGNOSIS — I739 Peripheral vascular disease, unspecified: Secondary | ICD-10-CM | POA: Diagnosis not present

## 2024-07-20 DIAGNOSIS — I4891 Unspecified atrial fibrillation: Secondary | ICD-10-CM | POA: Diagnosis not present

## 2024-07-20 DIAGNOSIS — I10 Essential (primary) hypertension: Secondary | ICD-10-CM | POA: Diagnosis not present

## 2024-07-23 DIAGNOSIS — L03311 Cellulitis of abdominal wall: Secondary | ICD-10-CM | POA: Diagnosis not present

## 2024-07-23 DIAGNOSIS — T148XXA Other injury of unspecified body region, initial encounter: Secondary | ICD-10-CM | POA: Diagnosis not present

## 2024-07-24 ENCOUNTER — Telehealth: Payer: Self-pay | Admitting: Internal Medicine

## 2024-07-24 NOTE — Telephone Encounter (Signed)
 Called the pt and there was no answer- LMTCB

## 2024-07-24 NOTE — Telephone Encounter (Signed)
-----   Message from Ozell America sent at 07/20/2024  3:45 PM EDT ----- No immediate concerns but I still worry about the amio so rec f/u 3 m, call sooner if losing ground with ex tol  ----- Message ----- From: Interface, Lab In Three Zero One Sent: 07/19/2024   2:49 PM EDT To: Ozell KATHEE America, MD

## 2024-07-24 NOTE — Telephone Encounter (Signed)
 PT ret Leslie's call. Please try again.

## 2024-07-24 NOTE — Telephone Encounter (Signed)
 I called and there was no answer- LMTCB   When he calls back please inform him the PFT did not show any immediate concerns, but Dr Darlean is still concerned about amiodarone  issue, so he would like to see him back in 3 months.

## 2024-07-26 NOTE — Telephone Encounter (Signed)
 Called and spoke with patient,NFN

## 2024-07-30 ENCOUNTER — Telehealth: Payer: Self-pay

## 2024-07-30 NOTE — Telephone Encounter (Signed)
 Copied from CRM #[id number]. Topic: Clinical - Prescription Issue >> Jul 26, 2024  5:05 PM Rilla B wrote: Reason for CRM: Patient called concerning amiodarone  medication.  States he is not taking that medication and perhaps [facility] is using old information for him.  Please call patient @ [phone].  ATC x1. Lvm to call back.

## 2024-07-31 DIAGNOSIS — I10 Essential (primary) hypertension: Secondary | ICD-10-CM | POA: Diagnosis not present

## 2024-07-31 DIAGNOSIS — I4891 Unspecified atrial fibrillation: Secondary | ICD-10-CM | POA: Diagnosis not present

## 2024-07-31 DIAGNOSIS — I509 Heart failure, unspecified: Secondary | ICD-10-CM | POA: Diagnosis not present

## 2024-07-31 DIAGNOSIS — J449 Chronic obstructive pulmonary disease, unspecified: Secondary | ICD-10-CM | POA: Diagnosis not present

## 2024-07-31 NOTE — Telephone Encounter (Signed)
ATC x1.  LMTCB. 

## 2024-08-01 NOTE — Telephone Encounter (Signed)
 VM/ LM   Show that the Rx is question   is an old one from 2022   -NFN

## 2024-08-02 DIAGNOSIS — L03311 Cellulitis of abdominal wall: Secondary | ICD-10-CM | POA: Diagnosis not present

## 2024-08-02 DIAGNOSIS — I509 Heart failure, unspecified: Secondary | ICD-10-CM | POA: Diagnosis not present

## 2024-08-02 DIAGNOSIS — S31109A Unspecified open wound of abdominal wall, unspecified quadrant without penetration into peritoneal cavity, initial encounter: Secondary | ICD-10-CM | POA: Diagnosis not present

## 2024-08-02 DIAGNOSIS — I4891 Unspecified atrial fibrillation: Secondary | ICD-10-CM | POA: Diagnosis not present

## 2024-08-02 DIAGNOSIS — I10 Essential (primary) hypertension: Secondary | ICD-10-CM | POA: Diagnosis not present

## 2024-08-06 DIAGNOSIS — Z22322 Carrier or suspected carrier of Methicillin resistant Staphylococcus aureus: Secondary | ICD-10-CM | POA: Diagnosis not present

## 2024-08-06 DIAGNOSIS — L03311 Cellulitis of abdominal wall: Secondary | ICD-10-CM | POA: Diagnosis not present

## 2024-08-07 DIAGNOSIS — R7989 Other specified abnormal findings of blood chemistry: Secondary | ICD-10-CM | POA: Diagnosis not present

## 2024-08-07 DIAGNOSIS — R140 Abdominal distension (gaseous): Secondary | ICD-10-CM | POA: Diagnosis not present

## 2024-08-08 DIAGNOSIS — R945 Abnormal results of liver function studies: Secondary | ICD-10-CM | POA: Diagnosis not present

## 2024-08-08 DIAGNOSIS — R7401 Elevation of levels of liver transaminase levels: Secondary | ICD-10-CM | POA: Diagnosis not present

## 2024-08-17 DIAGNOSIS — I739 Peripheral vascular disease, unspecified: Secondary | ICD-10-CM | POA: Diagnosis not present

## 2024-08-17 DIAGNOSIS — I509 Heart failure, unspecified: Secondary | ICD-10-CM | POA: Diagnosis not present

## 2024-08-17 DIAGNOSIS — I4891 Unspecified atrial fibrillation: Secondary | ICD-10-CM | POA: Diagnosis not present

## 2024-08-17 DIAGNOSIS — I10 Essential (primary) hypertension: Secondary | ICD-10-CM | POA: Diagnosis not present

## 2024-08-19 DIAGNOSIS — M79642 Pain in left hand: Secondary | ICD-10-CM | POA: Diagnosis not present

## 2024-08-19 DIAGNOSIS — M25532 Pain in left wrist: Secondary | ICD-10-CM | POA: Diagnosis not present

## 2024-08-23 DIAGNOSIS — Z951 Presence of aortocoronary bypass graft: Secondary | ICD-10-CM | POA: Diagnosis not present

## 2024-08-23 DIAGNOSIS — Z95810 Presence of automatic (implantable) cardiac defibrillator: Secondary | ICD-10-CM | POA: Diagnosis not present

## 2024-08-23 DIAGNOSIS — I480 Paroxysmal atrial fibrillation: Secondary | ICD-10-CM | POA: Diagnosis not present

## 2024-08-23 DIAGNOSIS — E782 Mixed hyperlipidemia: Secondary | ICD-10-CM | POA: Diagnosis not present

## 2024-08-23 DIAGNOSIS — I2510 Atherosclerotic heart disease of native coronary artery without angina pectoris: Secondary | ICD-10-CM | POA: Diagnosis not present

## 2024-08-23 DIAGNOSIS — I517 Cardiomegaly: Secondary | ICD-10-CM | POA: Diagnosis not present

## 2024-08-23 DIAGNOSIS — I5020 Unspecified systolic (congestive) heart failure: Secondary | ICD-10-CM | POA: Diagnosis not present

## 2024-08-29 DIAGNOSIS — I10 Essential (primary) hypertension: Secondary | ICD-10-CM | POA: Diagnosis not present

## 2024-08-29 DIAGNOSIS — I4891 Unspecified atrial fibrillation: Secondary | ICD-10-CM | POA: Diagnosis not present

## 2024-08-29 DIAGNOSIS — J449 Chronic obstructive pulmonary disease, unspecified: Secondary | ICD-10-CM | POA: Diagnosis not present

## 2024-08-29 DIAGNOSIS — I509 Heart failure, unspecified: Secondary | ICD-10-CM | POA: Diagnosis not present

## 2024-08-30 DIAGNOSIS — I509 Heart failure, unspecified: Secondary | ICD-10-CM | POA: Diagnosis not present

## 2024-08-30 DIAGNOSIS — I4891 Unspecified atrial fibrillation: Secondary | ICD-10-CM | POA: Diagnosis not present

## 2024-08-30 DIAGNOSIS — I739 Peripheral vascular disease, unspecified: Secondary | ICD-10-CM | POA: Diagnosis not present

## 2024-08-30 DIAGNOSIS — I10 Essential (primary) hypertension: Secondary | ICD-10-CM | POA: Diagnosis not present

## 2024-09-07 DIAGNOSIS — I10 Essential (primary) hypertension: Secondary | ICD-10-CM | POA: Diagnosis not present

## 2024-09-07 DIAGNOSIS — E0590 Thyrotoxicosis, unspecified without thyrotoxic crisis or storm: Secondary | ICD-10-CM | POA: Diagnosis not present

## 2024-09-07 DIAGNOSIS — E559 Vitamin D deficiency, unspecified: Secondary | ICD-10-CM | POA: Diagnosis not present

## 2024-09-07 DIAGNOSIS — E785 Hyperlipidemia, unspecified: Secondary | ICD-10-CM | POA: Diagnosis not present

## 2024-09-07 DIAGNOSIS — Z79899 Other long term (current) drug therapy: Secondary | ICD-10-CM | POA: Diagnosis not present

## 2024-09-07 DIAGNOSIS — E119 Type 2 diabetes mellitus without complications: Secondary | ICD-10-CM | POA: Diagnosis not present

## 2024-09-07 DIAGNOSIS — D518 Other vitamin B12 deficiency anemias: Secondary | ICD-10-CM | POA: Diagnosis not present

## 2024-09-08 DIAGNOSIS — Z79899 Other long term (current) drug therapy: Secondary | ICD-10-CM | POA: Diagnosis not present

## 2024-09-08 DIAGNOSIS — E0590 Thyrotoxicosis, unspecified without thyrotoxic crisis or storm: Secondary | ICD-10-CM | POA: Diagnosis not present

## 2024-09-08 DIAGNOSIS — E559 Vitamin D deficiency, unspecified: Secondary | ICD-10-CM | POA: Diagnosis not present

## 2024-09-08 DIAGNOSIS — D518 Other vitamin B12 deficiency anemias: Secondary | ICD-10-CM | POA: Diagnosis not present

## 2024-09-08 DIAGNOSIS — E119 Type 2 diabetes mellitus without complications: Secondary | ICD-10-CM | POA: Diagnosis not present

## 2024-09-11 DIAGNOSIS — Z1283 Encounter for screening for malignant neoplasm of skin: Secondary | ICD-10-CM | POA: Diagnosis not present

## 2024-09-11 DIAGNOSIS — X32XXXD Exposure to sunlight, subsequent encounter: Secondary | ICD-10-CM | POA: Diagnosis not present

## 2024-09-11 DIAGNOSIS — L821 Other seborrheic keratosis: Secondary | ICD-10-CM | POA: Diagnosis not present

## 2024-09-11 DIAGNOSIS — D0439 Carcinoma in situ of skin of other parts of face: Secondary | ICD-10-CM | POA: Diagnosis not present

## 2024-09-11 DIAGNOSIS — D0462 Carcinoma in situ of skin of left upper limb, including shoulder: Secondary | ICD-10-CM | POA: Diagnosis not present

## 2024-09-11 DIAGNOSIS — D225 Melanocytic nevi of trunk: Secondary | ICD-10-CM | POA: Diagnosis not present

## 2024-09-11 DIAGNOSIS — L905 Scar conditions and fibrosis of skin: Secondary | ICD-10-CM | POA: Diagnosis not present

## 2024-09-11 DIAGNOSIS — L570 Actinic keratosis: Secondary | ICD-10-CM | POA: Diagnosis not present

## 2024-09-14 DIAGNOSIS — R0981 Nasal congestion: Secondary | ICD-10-CM | POA: Diagnosis not present

## 2024-09-14 DIAGNOSIS — I739 Peripheral vascular disease, unspecified: Secondary | ICD-10-CM | POA: Diagnosis not present

## 2024-09-14 DIAGNOSIS — I509 Heart failure, unspecified: Secondary | ICD-10-CM | POA: Diagnosis not present

## 2024-09-14 DIAGNOSIS — I4891 Unspecified atrial fibrillation: Secondary | ICD-10-CM | POA: Diagnosis not present

## 2024-09-19 DIAGNOSIS — I509 Heart failure, unspecified: Secondary | ICD-10-CM | POA: Diagnosis not present

## 2024-09-27 DIAGNOSIS — I10 Essential (primary) hypertension: Secondary | ICD-10-CM | POA: Diagnosis not present

## 2024-09-27 DIAGNOSIS — I739 Peripheral vascular disease, unspecified: Secondary | ICD-10-CM | POA: Diagnosis not present

## 2024-09-27 DIAGNOSIS — I509 Heart failure, unspecified: Secondary | ICD-10-CM | POA: Diagnosis not present

## 2024-09-27 DIAGNOSIS — I4891 Unspecified atrial fibrillation: Secondary | ICD-10-CM | POA: Diagnosis not present

## 2024-09-28 DIAGNOSIS — I10 Essential (primary) hypertension: Secondary | ICD-10-CM | POA: Diagnosis not present

## 2024-09-28 DIAGNOSIS — J449 Chronic obstructive pulmonary disease, unspecified: Secondary | ICD-10-CM | POA: Diagnosis not present

## 2024-09-28 DIAGNOSIS — I509 Heart failure, unspecified: Secondary | ICD-10-CM | POA: Diagnosis not present

## 2024-09-28 DIAGNOSIS — I4891 Unspecified atrial fibrillation: Secondary | ICD-10-CM | POA: Diagnosis not present

## 2024-10-15 DIAGNOSIS — B351 Tinea unguium: Secondary | ICD-10-CM | POA: Diagnosis not present

## 2024-10-15 DIAGNOSIS — E1159 Type 2 diabetes mellitus with other circulatory complications: Secondary | ICD-10-CM | POA: Diagnosis not present

## 2024-10-16 DIAGNOSIS — H43813 Vitreous degeneration, bilateral: Secondary | ICD-10-CM | POA: Diagnosis not present

## 2024-10-16 DIAGNOSIS — H353112 Nonexudative age-related macular degeneration, right eye, intermediate dry stage: Secondary | ICD-10-CM | POA: Diagnosis not present

## 2024-10-16 DIAGNOSIS — H353123 Nonexudative age-related macular degeneration, left eye, advanced atrophic without subfoveal involvement: Secondary | ICD-10-CM | POA: Diagnosis not present

## 2024-10-16 DIAGNOSIS — H353233 Exudative age-related macular degeneration, bilateral, with inactive scar: Secondary | ICD-10-CM | POA: Diagnosis not present

## 2024-10-23 DIAGNOSIS — Z85828 Personal history of other malignant neoplasm of skin: Secondary | ICD-10-CM | POA: Diagnosis not present

## 2024-10-23 DIAGNOSIS — D0462 Carcinoma in situ of skin of left upper limb, including shoulder: Secondary | ICD-10-CM | POA: Diagnosis not present

## 2024-10-23 DIAGNOSIS — I872 Venous insufficiency (chronic) (peripheral): Secondary | ICD-10-CM | POA: Diagnosis not present

## 2024-10-23 DIAGNOSIS — L0100 Impetigo, unspecified: Secondary | ICD-10-CM | POA: Diagnosis not present

## 2024-10-23 DIAGNOSIS — Z08 Encounter for follow-up examination after completed treatment for malignant neoplasm: Secondary | ICD-10-CM | POA: Diagnosis not present

## 2024-10-24 DIAGNOSIS — I472 Ventricular tachycardia, unspecified: Secondary | ICD-10-CM | POA: Diagnosis not present

## 2024-10-24 DIAGNOSIS — E782 Mixed hyperlipidemia: Secondary | ICD-10-CM | POA: Diagnosis not present

## 2024-10-24 DIAGNOSIS — Z95810 Presence of automatic (implantable) cardiac defibrillator: Secondary | ICD-10-CM | POA: Diagnosis not present

## 2024-10-24 DIAGNOSIS — Z951 Presence of aortocoronary bypass graft: Secondary | ICD-10-CM | POA: Diagnosis not present

## 2024-10-24 DIAGNOSIS — I5020 Unspecified systolic (congestive) heart failure: Secondary | ICD-10-CM | POA: Diagnosis not present

## 2024-10-24 DIAGNOSIS — I2510 Atherosclerotic heart disease of native coronary artery without angina pectoris: Secondary | ICD-10-CM | POA: Diagnosis not present

## 2024-10-24 DIAGNOSIS — I480 Paroxysmal atrial fibrillation: Secondary | ICD-10-CM | POA: Diagnosis not present

## 2024-10-24 DIAGNOSIS — I872 Venous insufficiency (chronic) (peripheral): Secondary | ICD-10-CM | POA: Diagnosis not present

## 2024-10-29 DIAGNOSIS — I509 Heart failure, unspecified: Secondary | ICD-10-CM | POA: Diagnosis not present

## 2024-10-29 DIAGNOSIS — Z79899 Other long term (current) drug therapy: Secondary | ICD-10-CM | POA: Diagnosis not present

## 2024-10-30 ENCOUNTER — Ambulatory Visit

## 2024-10-30 ENCOUNTER — Encounter: Payer: Self-pay | Admitting: Internal Medicine

## 2024-10-30 ENCOUNTER — Ambulatory Visit (INDEPENDENT_AMBULATORY_CARE_PROVIDER_SITE_OTHER): Admitting: Internal Medicine

## 2024-10-30 VITALS — BP 124/68 | HR 81 | Ht 68.5 in | Wt 236.4 lb

## 2024-10-30 DIAGNOSIS — R9389 Abnormal findings on diagnostic imaging of other specified body structures: Secondary | ICD-10-CM | POA: Diagnosis not present

## 2024-10-30 DIAGNOSIS — R0609 Other forms of dyspnea: Secondary | ICD-10-CM

## 2024-10-30 DIAGNOSIS — I10 Essential (primary) hypertension: Secondary | ICD-10-CM | POA: Diagnosis not present

## 2024-10-30 DIAGNOSIS — R0600 Dyspnea, unspecified: Secondary | ICD-10-CM | POA: Diagnosis not present

## 2024-10-30 DIAGNOSIS — I509 Heart failure, unspecified: Secondary | ICD-10-CM | POA: Diagnosis not present

## 2024-10-30 DIAGNOSIS — J449 Chronic obstructive pulmonary disease, unspecified: Secondary | ICD-10-CM | POA: Diagnosis not present

## 2024-10-30 DIAGNOSIS — I4891 Unspecified atrial fibrillation: Secondary | ICD-10-CM | POA: Diagnosis not present

## 2024-10-30 NOTE — Patient Instructions (Signed)
 Pace yourself to try to maintain exertion for at least 10 min several times a day   Make sure you check your oxygen saturation at your highest level of activity(NOT after you stop)  to be sure it stays over 90% and keep track of it at least once a week, more often if breathing getting worse, and let me know if losing ground. (Collect the dots to connect the dots approach)    Please remember to go to the  x-ray department  for your tests - we will call you with the results when they are available    Please schedule a follow up visit in 3 months but call sooner if needed

## 2024-10-30 NOTE — Progress Notes (Unsigned)
 Bryan Pratt, male    DOB: [birth year]   MRN: [id number]   Brief patient profile:  89  yowm never smoker  referred to pulmonary clinic 07/12/2024 by Bryan Lesches MD  for doe  onset around 2005 at wt 209        Phone care 2021 for COVID = only contact with PCCM  in EPIC with 4 bouts with covid last in winter 2024 back to baseline each time  PFTs  reported with nl dlco 2019 /mod restriction but actual full study not in epic/care everywhere   June 26 23  elevated R HD / clear lungs   History of Present Illness  07/12/2024  Pulmonary/ 1st office eval/Bryan Pratt @wt  234 on amiodarone  x about 3 m per wake (1st dosed in 2014 per EPIC med review)  Chief Complaint  Patient presents with   Consult    Worsening SOB, only on exertion.   Dyspnea:  power walk at lunch one year prior to OV  x 30 min about a mile and now 150 ft in hallway = has to sit down due to fatigue and doe  Cough: none  Sleep: bed is 30 degrees and one pillow (o/w bad gerd x years )  SABA use: no better  02 ldz:wnwz   Rec  Cxr ok Doe labs ok Hct 51% PFT 's next available and I will call you the results with results and where to go from there   10/30/2024  f/u ov/Bryan Pratt re: doe with abn ERV main finding    maint on no rx -  amiodarone  off x 3 months  Chief Complaint  Patient presents with   Medical Management of Chronic Issues   Shortness of Breath    Breathing is about the same since the last visit. No new co's.   Dyspnea:  walking halls and has to stop p 100 ft  Cough: none  Sleeping: 30-45 degrees one pillow s  resp cc   freq nocturia  SABA use: none  02: none   No obvious day to day or daytime variability or assoc excess/ purulent sputum or mucus plugs or hemoptysis or cp or chest tightness, subjective wheeze or overt   hb symptoms.    Also denies any obvious fluctuation of symptoms with weather or environmental changes or other aggravating or alleviating factors except as outlined above   No unusual exposure hx or h/o  childhood pna/ asthma or knowledge of premature birth.  Current Allergies, Complete Past Medical History, Past Surgical History, Family History, and Social History were reviewed in [employer] record.  ROS  The following are not active complaints unless bolded Hoarseness, sore throat, dysphagia, dental problems, itching, sneezing,  nasal congestion or discharge of excess mucus or purulent secretions, ear ache,   fever, chills, sweats, unintended wt loss or wt gain, classically pleuritic or exertional cp,  orthopnea pnd or arm/hand swelling  or leg swelling, presyncope, palpitations, abdominal pain, anorexia, nausea, vomiting, diarrhea  or change in bowel habits or change in bladder habits, change in stools or change in urine, dysuria, hematuria,  rash, arthralgias, visual complaints, headache, numbness, weakness or ataxia or problems with walking or coordination,  change in mood or  memory.        Current Meds  Medication Sig   acetaminophen  (TYLENOL ) 325 MG tablet Take 650 mg by mouth every 6 (six) hours as needed.   albuterol  (VENTOLIN  HFA) 108 (90 Base) MCG/ACT inhaler Inhale 1-2 puffs into the  lungs every 4 (four) hours as needed for wheezing or shortness of breath.   Ascorbic Acid  (VITAMIN C ) 100 MG tablet Take 100 mg by mouth daily.   atorvastatin  (LIPITOR) 40 MG tablet Take 40 mg by mouth daily.   beta carotene w/minerals (OCUVITE) tablet Take 1 tablet by mouth 2 (two) times daily.   bumetanide (BUMEX) 2 MG tablet Take 2 mg by mouth 2 (two) times daily.   cholecalciferol (VITAMIN D) 1000 units tablet Take 1,000 Units by mouth daily.   Cyanocobalamin  (VITAMIN B 12) 500 MCG TABS Take 500 mcg by mouth daily.   Docusate Calcium  (STOOL SOFTENER PO) Take 100 mg by mouth daily as needed (constipation).   ELIQUIS 5 MG TABS tablet Take 5 mg by mouth 2 (two) times daily.   furosemide  (LASIX ) 40 MG tablet Take 1 tablet (40 mg total) by mouth daily. (Patient taking differently:  Take 80 mg by mouth 2 (two) times daily.)   gabapentin  (NEURONTIN ) 300 MG capsule Take 1 capsule (300 mg total) by mouth at bedtime.   glimepiride  (AMARYL ) 2 MG tablet Take 2 mg by mouth daily with breakfast.   ivabradine (CORLANOR) 5 MG TABS tablet Take 5 mg by mouth 2 (two) times daily with a meal.   magnesium oxide (MAG-OX) 400 (240 Mg) MG tablet Take 400 mg by mouth daily.   methocarbamol  (ROBAXIN ) 500 MG tablet Take 1 tablet (500 mg total) by mouth every 6 (six) hours as needed for muscle spasms.   Multiple Vitamins-Minerals (MULTIVITAMIN PO) Take 1 tablet by mouth daily.   Multiple Vitamins-Minerals (PRESERVISION AREDS 2 PO) Take by mouth.   nitroGLYCERIN  (NITROSTAT ) 0.4 MG SL tablet Place 1 tablet (0.4 mg total) under the tongue every 5 (five) minutes as needed for chest pain.   ondansetron  (ZOFRAN ) 4 MG tablet Take 1 tablet (4 mg total) by mouth every 6 (six) hours as needed for nausea.   polyethylene glycol (MIRALAX / GLYCOLAX) 17 g packet Take 17 g by mouth daily.   potassium chloride  (KLOR-CON ) 10 MEQ tablet Take 1 tablet by mouth daily as needed (when he feels like he needs it).   quiniDINE gluconate  324 MG CR tablet Take 324 mg by mouth daily.   spironolactone  (ALDACTONE ) 25 MG tablet Take 25 mg by mouth daily. (Patient taking differently: Take 12.5 mg by mouth daily.)   tobramycin (TOBREX) 0.3 % ophthalmic solution Place 1 drop into the right eye 2 (two) times daily. Instructed to take eye drop 2 days before and 1 day after eye treatment.   torsemide (DEMADEX) 20 MG tablet Take 20 mg by mouth daily.   traMADol  (ULTRAM ) 50 MG tablet Take 50 mg by mouth every 6 (six) hours as needed for moderate pain.    triamcinolone  (KENALOG ) 0.025 % cream Apply 1 Application topically 2 (two) times daily.   VITAMIN E PO Take 1,000 Units by mouth daily.        Past Medical History:  Diagnosis Date   AICD (automatic cardioverter/defibrillator) present 2020   Arthritis    knees, shoulders,    Atrial fibrillation (HCC)    CAD (coronary artery disease)    s/p cath, occluded LAD with collateral fillling, 2008, last cath 03/03/2009 with CTO of the LAD multivessell.   CHF (congestive heart failure) (HCC)    Complication of anesthesia    blood pressure drop   Coronary atherosclerosis of native coronary artery    Diabetes (HCC)    GERD (gastroesophageal reflux disease)    Hyperlipidemia  Hypertension    Pt denies   Macular degeneration    Obesity    Rotator cuff tear, left       Objective:    Wts  10/30/2024     236   07/12/24 234 lb (106.1 kg)  10/28/21 218 lb (98.9 kg)  10/16/21 218 lb (98.9 kg)    Vital signs reviewed  10/30/2024  - Note at rest 02 sats  93% on RA    General appearance:    mod obese (by BMI) with protuberant abd       HEENT : Oropharynx  clear      Nasal turbinates nl    NECK :  without  apparent JVD/ palpable Nodes/TM    LUNGS: no acc muscle use,  Nl contour chest with minimal insp crackles bases    CV:  RRR  no s3 or murmur or increase in P2, and  1-2+ pitting edema both LEs  ABD:  soft and nontender but massive and poor movement with inspiration semi supine   MS:  Gait nl  ext warm without deformities Or obvious joint restrictions  calf tenderness, cyanosis or clubbing    SKIN: warm and dry with  severe chronic venous stasis with rubror and hyperpigmentation both legs knee down  NEURO:  alert, approp, nl sensorium with  no motor or cerebellar deficits apparent.       CXR PA and Lateral:   10/30/2024 :    I personally reviewed images and impression is as follows:     No significant change CM/ low lung volumes, increased marking L > R base, no effusions     Assessment     Assessment & Plan DOE (dyspnea on exertion) Never smoker 1st rx for Amiodarone  in Epic = 2014   - not clear how low he really took it as subsequent doses not in EPIC ? D/c'd  - 07/12/2024 @ wt 234   Walked on RA  x  3  lap(s) =  approx 500  ft  @  slow pace/  stopped due to sob/ fatigue   with lowest 02 sats 93% but improved to 97% by end   - PFT's  07/18/24  FEV1 1.16 (43 % ) ratio 0.80  p 14 % improvement from saba p 0 prior to study with DLCO  15.6 (71%)   and FV curve minimally concave   and ERV 54%  at wt 235  - ? Off amiodarone  07/2024  with ESR  30  8//7/25  - 10/30/2024  at wt 236  Walked on RA  x  1  lap(s) =  approx 250  ft  @ slow to moderate  pace, stopped due to sob/him and leg pains with lowest 02 sats 93   Main issues appear to be cardiac, obesity and conditioning related and nothing else to offer at this poin t  Rec sub max exercise when feasible/ tolerated from orthopedic perspective   .Each maintenance medication was reviewed in detail including emphasizing most importantly the difference between maintenance and prns and under what circumstances the prns are to be triggered using an action plan format where appropriate.  Total time for H and P, chart review, counseling,  directly observing portions of ambulatory 02 saturation study/ and generating customized AVS unique to this office visit / same day charting = 31 min                   AVS  Patient Instructions  Pace  yourself to try to maintain exertion for at least 10 min several times a day   Make sure you check your oxygen saturation at your highest level of activity(NOT after you stop)  to be sure it stays over 90% and keep track of it at least once a week, more often if breathing getting worse, and let me know if losing ground. (Collect the dots to connect the dots approach)    Please remember to go to the  x-ray department  for your tests - we will call you with the results when they are available    Please schedule a follow up visit in 3 months but call sooner if needed    Ozell America, MD 11/01/2024

## 2024-10-31 DIAGNOSIS — M6281 Muscle weakness (generalized): Secondary | ICD-10-CM | POA: Diagnosis not present

## 2024-10-31 DIAGNOSIS — R635 Abnormal weight gain: Secondary | ICD-10-CM | POA: Diagnosis not present

## 2024-10-31 DIAGNOSIS — I509 Heart failure, unspecified: Secondary | ICD-10-CM | POA: Diagnosis not present

## 2024-11-01 NOTE — Assessment & Plan Note (Addendum)
 Never smoker 1st rx for Amiodarone  in Epic = 2014   - not clear how low he really took it as subsequent doses not in EPIC ? D/c'd  - 07/12/2024 @ wt 234   Walked on RA  x  3  lap(s) =  approx 500  ft  @  slow pace/ stopped due to sob/ fatigue   with lowest 02 sats 93% but improved to 97% by end   - PFT's  07/18/24  FEV1 1.16 (43 % ) ratio 0.80  p 14 % improvement from saba p 0 prior to study with DLCO  15.6 (71%)   and FV curve minimally concave   and ERV 54%  at wt 235  - ? Off amiodarone  07/2024  with ESR  30  8//7/25  - 10/30/2024  at wt 236  Walked on RA  x  1  lap(s) =  approx 250  ft  @ slow to moderate  pace, stopped due to sob/him and leg pains with lowest 02 sats 93   Main issues appear to be cardiac, obesity and conditioning related and nothing else to offer at this poin t  Rec sub max exercise when feasible/ tolerated from orthopedic perspective   .Each maintenance medication was reviewed in detail including emphasizing most importantly the difference between maintenance and prns and under what circumstances the prns are to be triggered using an action plan format where appropriate.  Total time for H and P, chart review, counseling,  directly observing portions of ambulatory 02 saturation study/ and generating customized AVS unique to this office visit / same day charting = 31 min

## 2024-11-02 DIAGNOSIS — M6281 Muscle weakness (generalized): Secondary | ICD-10-CM | POA: Diagnosis not present

## 2024-11-02 DIAGNOSIS — I10 Essential (primary) hypertension: Secondary | ICD-10-CM | POA: Diagnosis not present

## 2024-11-02 DIAGNOSIS — I509 Heart failure, unspecified: Secondary | ICD-10-CM | POA: Diagnosis not present

## 2024-11-02 DIAGNOSIS — R635 Abnormal weight gain: Secondary | ICD-10-CM | POA: Diagnosis not present

## 2024-11-05 ENCOUNTER — Ambulatory Visit: Payer: Self-pay | Admitting: Internal Medicine

## 2024-11-05 NOTE — Progress Notes (Signed)
 Called and spoke to pt - advised of CXR results per Dr. Darlean. Pt verbalized understanding, NFN.

## 2024-11-07 DIAGNOSIS — I10 Essential (primary) hypertension: Secondary | ICD-10-CM | POA: Diagnosis not present

## 2024-11-12 DIAGNOSIS — J449 Chronic obstructive pulmonary disease, unspecified: Secondary | ICD-10-CM | POA: Diagnosis not present

## 2024-11-12 DIAGNOSIS — I4891 Unspecified atrial fibrillation: Secondary | ICD-10-CM | POA: Diagnosis not present

## 2024-11-12 DIAGNOSIS — Z95810 Presence of automatic (implantable) cardiac defibrillator: Secondary | ICD-10-CM | POA: Diagnosis not present

## 2024-11-12 DIAGNOSIS — I509 Heart failure, unspecified: Secondary | ICD-10-CM | POA: Diagnosis not present

## 2024-11-12 DIAGNOSIS — E119 Type 2 diabetes mellitus without complications: Secondary | ICD-10-CM | POA: Diagnosis not present

## 2024-11-20 DIAGNOSIS — X32XXXD Exposure to sunlight, subsequent encounter: Secondary | ICD-10-CM | POA: Diagnosis not present

## 2024-11-20 DIAGNOSIS — I872 Venous insufficiency (chronic) (peripheral): Secondary | ICD-10-CM | POA: Diagnosis not present

## 2024-11-20 DIAGNOSIS — Z85828 Personal history of other malignant neoplasm of skin: Secondary | ICD-10-CM | POA: Diagnosis not present

## 2024-11-20 DIAGNOSIS — Z08 Encounter for follow-up examination after completed treatment for malignant neoplasm: Secondary | ICD-10-CM | POA: Diagnosis not present

## 2024-11-20 DIAGNOSIS — L570 Actinic keratosis: Secondary | ICD-10-CM | POA: Diagnosis not present

## 2025-02-12 ENCOUNTER — Ambulatory Visit: Admitting: Internal Medicine
